# Patient Record
Sex: Male | Born: 1935
Health system: Southern US, Community
[De-identification: ages and names within clinical notes are randomized; demographics above are authoritative.]

## PROBLEM LIST (undated history)

## (undated) DIAGNOSIS — K219 Gastro-esophageal reflux disease without esophagitis: Secondary | ICD-10-CM

## (undated) DIAGNOSIS — I350 Nonrheumatic aortic (valve) stenosis: Secondary | ICD-10-CM

## (undated) DIAGNOSIS — D631 Anemia in chronic kidney disease: Secondary | ICD-10-CM

## (undated) DIAGNOSIS — C9140 Hairy cell leukemia not having achieved remission: Secondary | ICD-10-CM

## (undated) DIAGNOSIS — T82190A Other mechanical complication of cardiac electrode, initial encounter: Secondary | ICD-10-CM

## (undated) DIAGNOSIS — I4891 Unspecified atrial fibrillation: Secondary | ICD-10-CM

## (undated) DIAGNOSIS — Z5189 Encounter for other specified aftercare: Secondary | ICD-10-CM

## (undated) DIAGNOSIS — IMO0001 Reserved for inherently not codable concepts without codable children: Secondary | ICD-10-CM

## (undated) DIAGNOSIS — D509 Iron deficiency anemia, unspecified: Secondary | ICD-10-CM

## (undated) DIAGNOSIS — I255 Ischemic cardiomyopathy: Secondary | ICD-10-CM

## (undated) DIAGNOSIS — I10 Essential (primary) hypertension: Secondary | ICD-10-CM

## (undated) DIAGNOSIS — C189 Malignant neoplasm of colon, unspecified: Secondary | ICD-10-CM

## (undated) DIAGNOSIS — R0602 Shortness of breath: Secondary | ICD-10-CM

## (undated) DIAGNOSIS — N189 Chronic kidney disease, unspecified: Secondary | ICD-10-CM

## (undated) DIAGNOSIS — D696 Thrombocytopenia, unspecified: Secondary | ICD-10-CM

## (undated) DIAGNOSIS — D649 Anemia, unspecified: Secondary | ICD-10-CM

## (undated) DIAGNOSIS — C61 Malignant neoplasm of prostate: Secondary | ICD-10-CM

## (undated) DIAGNOSIS — I2510 Atherosclerotic heart disease of native coronary artery without angina pectoris: Secondary | ICD-10-CM

## (undated) HISTORY — DX: Malignant neoplasm of prostate: C61

## (undated) HISTORY — PX: OTHER SURGICAL HISTORY: SHX169

## (undated) HISTORY — PX: APPENDECTOMY: SHX54

## (undated) HISTORY — DX: Anemia in chronic kidney disease: D63.1

## (undated) HISTORY — DX: Iron deficiency anemia, unspecified: D50.9

## (undated) HISTORY — DX: Thrombocytopenia, unspecified: D69.6

## (undated) HISTORY — PX: AORTIC VALVE REPLACEMENT: SHX41

## (undated) HISTORY — PX: CORONARY ARTERY BYPASS GRAFT: SHX141

## (undated) HISTORY — DX: Malignant neoplasm of colon, unspecified: C18.9

## (undated) HISTORY — PX: INSERT / REPLACE / REMOVE PACEMAKER: SUR710

## (undated) HISTORY — DX: Nonrheumatic aortic (valve) stenosis: I35.0

## (undated) HISTORY — DX: Unspecified atrial fibrillation: I48.91

## (undated) HISTORY — DX: Essential (primary) hypertension: I10

## (undated) HISTORY — PX: CARDIAC SURGERY: SHX584

## (undated) HISTORY — DX: Other mechanical complication of cardiac electrode, initial encounter: T82.190A

## (undated) HISTORY — DX: Chronic kidney disease, unspecified: N18.9

## (undated) HISTORY — DX: Hairy cell leukemia not having achieved remission: C91.40

## (undated) HISTORY — DX: Atherosclerotic heart disease of native coronary artery without angina pectoris: I25.10

## (undated) HISTORY — DX: Gastro-esophageal reflux disease without esophagitis: K21.9

---

## 1998-08-15 ENCOUNTER — Ambulatory Visit: Admission: RE | Admit: 1998-08-15 | Discharge: 1998-08-15 | Payer: Self-pay | Admitting: Critical Care Medicine

## 1998-10-06 HISTORY — PX: HEMICOLECTOMY: SHX854

## 1999-10-07 ENCOUNTER — Inpatient Hospital Stay (HOSPITAL_COMMUNITY): Admission: EM | Admit: 1999-10-07 | Discharge: 1999-10-17 | Payer: Self-pay | Admitting: Emergency Medicine

## 1999-10-07 ENCOUNTER — Encounter: Payer: Self-pay | Admitting: Emergency Medicine

## 1999-10-07 ENCOUNTER — Encounter (INDEPENDENT_AMBULATORY_CARE_PROVIDER_SITE_OTHER): Payer: Self-pay | Admitting: *Deleted

## 1999-10-10 ENCOUNTER — Encounter: Payer: Self-pay | Admitting: *Deleted

## 2000-11-13 ENCOUNTER — Ambulatory Visit (HOSPITAL_COMMUNITY): Admission: RE | Admit: 2000-11-13 | Discharge: 2000-11-13 | Payer: Self-pay | Admitting: *Deleted

## 2000-11-13 ENCOUNTER — Encounter: Payer: Self-pay | Admitting: *Deleted

## 2000-11-30 ENCOUNTER — Encounter (INDEPENDENT_AMBULATORY_CARE_PROVIDER_SITE_OTHER): Payer: Self-pay | Admitting: Specialist

## 2000-11-30 ENCOUNTER — Ambulatory Visit (HOSPITAL_COMMUNITY): Admission: RE | Admit: 2000-11-30 | Discharge: 2000-11-30 | Payer: Self-pay | Admitting: *Deleted

## 2002-02-21 ENCOUNTER — Emergency Department (HOSPITAL_COMMUNITY): Admission: EM | Admit: 2002-02-21 | Discharge: 2002-02-21 | Payer: Self-pay | Admitting: Emergency Medicine

## 2002-03-22 ENCOUNTER — Emergency Department (HOSPITAL_COMMUNITY): Admission: EM | Admit: 2002-03-22 | Discharge: 2002-03-22 | Payer: Self-pay | Admitting: Emergency Medicine

## 2002-03-22 ENCOUNTER — Encounter: Payer: Self-pay | Admitting: Emergency Medicine

## 2003-02-02 ENCOUNTER — Ambulatory Visit (HOSPITAL_COMMUNITY): Admission: RE | Admit: 2003-02-02 | Discharge: 2003-02-02 | Payer: Self-pay | Admitting: *Deleted

## 2004-01-25 ENCOUNTER — Ambulatory Visit (HOSPITAL_COMMUNITY): Admission: RE | Admit: 2004-01-25 | Discharge: 2004-01-25 | Payer: Self-pay | Admitting: *Deleted

## 2004-02-13 ENCOUNTER — Ambulatory Visit (HOSPITAL_COMMUNITY): Admission: RE | Admit: 2004-02-13 | Discharge: 2004-02-13 | Payer: Self-pay | Admitting: *Deleted

## 2004-02-13 ENCOUNTER — Encounter (INDEPENDENT_AMBULATORY_CARE_PROVIDER_SITE_OTHER): Payer: Self-pay | Admitting: Specialist

## 2006-09-05 ENCOUNTER — Ambulatory Visit: Payer: Self-pay | Admitting: Hematology & Oncology

## 2006-09-30 LAB — CBC & DIFF AND RETIC
BASO%: 0.7 % (ref 0.0–2.0)
Basophils Absolute: 0 10*3/uL (ref 0.0–0.1)
EOS%: 1.5 % (ref 0.0–7.0)
HCT: 39.2 % (ref 38.7–49.9)
IRF: 0.31 (ref 0.070–0.380)
MCH: 33.4 pg (ref 28.0–33.4)
MCHC: 35.3 g/dL (ref 32.0–35.9)
MCV: 94.5 fL (ref 81.6–98.0)
MONO%: 9.7 % (ref 0.0–13.0)
NEUT%: 45.4 % (ref 40.0–75.0)
RDW: 14.4 % (ref 11.2–14.6)
lymph#: 1.3 10*3/uL (ref 0.9–3.3)

## 2006-10-01 LAB — PROTEIN ELECTROPHORESIS, SERUM
Alpha-1-Globulin: 4 % (ref 2.9–4.9)
Alpha-2-Globulin: 10 % (ref 7.1–11.8)
Beta Globulin: 6.3 % (ref 4.7–7.2)
Total Protein, Serum Electrophoresis: 8.1 g/dL (ref 6.0–8.3)

## 2006-10-01 LAB — VITAMIN B12: Vitamin B-12: 314 pg/mL (ref 211–911)

## 2006-11-02 ENCOUNTER — Encounter (INDEPENDENT_AMBULATORY_CARE_PROVIDER_SITE_OTHER): Payer: Self-pay | Admitting: Specialist

## 2006-11-02 ENCOUNTER — Inpatient Hospital Stay (HOSPITAL_COMMUNITY): Admission: RE | Admit: 2006-11-02 | Discharge: 2006-11-11 | Payer: Self-pay | Admitting: Surgery

## 2006-11-24 ENCOUNTER — Ambulatory Visit: Payer: Self-pay | Admitting: Surgery

## 2006-12-02 ENCOUNTER — Ambulatory Visit: Payer: Self-pay | Admitting: Hematology & Oncology

## 2008-05-19 ENCOUNTER — Ambulatory Visit (HOSPITAL_COMMUNITY): Admission: RE | Admit: 2008-05-19 | Discharge: 2008-05-19 | Payer: Self-pay | Admitting: *Deleted

## 2008-11-26 ENCOUNTER — Emergency Department (HOSPITAL_COMMUNITY): Admission: EM | Admit: 2008-11-26 | Discharge: 2008-11-26 | Payer: Self-pay | Admitting: Emergency Medicine

## 2009-08-23 ENCOUNTER — Ambulatory Visit: Payer: Self-pay | Admitting: Internal Medicine

## 2009-08-23 ENCOUNTER — Inpatient Hospital Stay (HOSPITAL_COMMUNITY): Admission: EM | Admit: 2009-08-23 | Discharge: 2009-08-28 | Payer: Self-pay | Admitting: Emergency Medicine

## 2009-08-24 ENCOUNTER — Encounter (INDEPENDENT_AMBULATORY_CARE_PROVIDER_SITE_OTHER): Payer: Self-pay | Admitting: Internal Medicine

## 2009-08-24 ENCOUNTER — Ambulatory Visit: Payer: Self-pay | Admitting: Vascular Surgery

## 2009-08-24 ENCOUNTER — Encounter: Payer: Self-pay | Admitting: Internal Medicine

## 2009-08-27 ENCOUNTER — Encounter (INDEPENDENT_AMBULATORY_CARE_PROVIDER_SITE_OTHER): Payer: Self-pay | Admitting: Internal Medicine

## 2009-08-28 ENCOUNTER — Encounter: Payer: Self-pay | Admitting: Internal Medicine

## 2009-09-13 ENCOUNTER — Encounter: Payer: Self-pay | Admitting: Internal Medicine

## 2009-09-13 ENCOUNTER — Ambulatory Visit: Payer: Self-pay

## 2009-12-04 ENCOUNTER — Ambulatory Visit: Payer: Self-pay | Admitting: Internal Medicine

## 2009-12-04 DIAGNOSIS — Z950 Presence of cardiac pacemaker: Secondary | ICD-10-CM

## 2009-12-04 DIAGNOSIS — I25810 Atherosclerosis of coronary artery bypass graft(s) without angina pectoris: Secondary | ICD-10-CM | POA: Insufficient documentation

## 2009-12-04 DIAGNOSIS — I495 Sick sinus syndrome: Secondary | ICD-10-CM

## 2009-12-04 DIAGNOSIS — I4891 Unspecified atrial fibrillation: Secondary | ICD-10-CM | POA: Insufficient documentation

## 2009-12-10 LAB — CONVERTED CEMR LAB
CO2: 28 meq/L (ref 19–32)
Calcium: 9.5 mg/dL (ref 8.4–10.5)
Chloride: 108 meq/L (ref 96–112)
Creatinine, Ser: 1.5 mg/dL (ref 0.4–1.5)
Sodium: 139 meq/L (ref 135–145)

## 2010-01-01 ENCOUNTER — Ambulatory Visit: Payer: Self-pay | Admitting: Internal Medicine

## 2010-02-01 ENCOUNTER — Ambulatory Visit: Payer: Self-pay | Admitting: Internal Medicine

## 2010-05-13 ENCOUNTER — Ambulatory Visit: Payer: Self-pay

## 2010-05-13 ENCOUNTER — Encounter: Payer: Self-pay | Admitting: Internal Medicine

## 2010-09-03 ENCOUNTER — Ambulatory Visit: Payer: Self-pay | Admitting: Internal Medicine

## 2010-09-09 LAB — CONVERTED CEMR LAB: Pro B Natriuretic peptide (BNP): 206.9 pg/mL — ABNORMAL HIGH (ref 0.0–100.0)

## 2010-09-25 ENCOUNTER — Ambulatory Visit: Payer: Self-pay | Admitting: Internal Medicine

## 2010-10-02 LAB — CONVERTED CEMR LAB: Pro B Natriuretic peptide (BNP): 316.7 pg/mL — ABNORMAL HIGH (ref 0.0–100.0)

## 2010-10-28 ENCOUNTER — Encounter: Payer: Self-pay | Admitting: Physician Assistant

## 2010-10-28 ENCOUNTER — Other Ambulatory Visit: Payer: Self-pay | Admitting: Physician Assistant

## 2010-10-28 ENCOUNTER — Ambulatory Visit
Admission: RE | Admit: 2010-10-28 | Discharge: 2010-10-28 | Payer: Self-pay | Source: Home / Self Care | Attending: Internal Medicine | Admitting: Internal Medicine

## 2010-10-28 DIAGNOSIS — Z954 Presence of other heart-valve replacement: Secondary | ICD-10-CM | POA: Insufficient documentation

## 2010-10-28 DIAGNOSIS — I10 Essential (primary) hypertension: Secondary | ICD-10-CM | POA: Insufficient documentation

## 2010-10-28 LAB — BASIC METABOLIC PANEL
BUN: 25 mg/dL — ABNORMAL HIGH (ref 6–23)
CO2: 26 mEq/L (ref 19–32)
Calcium: 9 mg/dL (ref 8.4–10.5)
Chloride: 105 mEq/L (ref 96–112)
Creatinine, Ser: 1.6 mg/dL — ABNORMAL HIGH (ref 0.4–1.5)

## 2010-10-29 ENCOUNTER — Telehealth: Payer: Self-pay | Admitting: Physician Assistant

## 2010-11-05 NOTE — Cardiovascular Report (Signed)
Summary: Office Visit   Office Visit   Imported By: Roderic Ovens 05/29/2010 15:52:44  _____________________________________________________________________  External Attachment:    Type:   Image     Comment:   External Document

## 2010-11-05 NOTE — Assessment & Plan Note (Signed)
Summary: device/saf   CC:  device check.  Pt took last Lasix pill yesterday morning.  Not sure if he is supposed to renew this Rx.  Marland Kitchen  History of Present Illness: [name] is seen following CRT P. implantation for symptomatic bradycardia in this state of ischemic heart disease prior bypass surgery status post bioprosthetic aortic valve and ejection fraction of 40%.  We saw him last time there was symptoms of congestive heart failure. His BNP was over 600. His diuretics were increased. We also noted evidence of atrial fibrillation.  Most recent echocardiography from November 2010 reported an inability to measure ejection fraction because of poor acoustic windows, thought to be low normal. It is his impression and his wife's impression it is not considerably better. There was a recent episode where they thought he had pneumonia. This turned out to correlate with a week long of atrial fibrillation with a rapid ventricular response.    Current Medications (verified): 1)  Aspirin 81 Mg Tbec (Aspirin) .... Take One Tablet By Mouth Daily 2)  Carvedilol 25 Mg Tabs (Carvedilol) .... Take One Tablet Two Times A Day 3)  Flomax 0.4 Mg Caps (Tamsulosin Hcl) .... Take 1 Capsule By Mouth Once A Day 4)  Lisinopril 10 Mg Tabs (Lisinopril) .... Take One Tablet By Mouth Daily 5)  Mirtazapine 15 Mg Tabs (Mirtazapine) .... Take 1 Tab By Mouth At Bedtime 6)  Omeprazole 20 Mg Cpdr (Omeprazole) .... Take 1 Capsule By Mouth Once A Day 7)  Simvastatin 40 Mg Tabs (Simvastatin) .... Take One Tablet By Mouth Daily At Bedtime 8)  Lasix 20 Mg Tabs (Furosemide) .... Three Times Per Week  Allergies (verified): No Known Drug Allergies  Past History:  Past Medical History: Last updated: 11/30/2009 Atrial fibrillation Coronary artery disease Aortic stenosis Hypertension Colon cancer Prostate cancer Gastroesophageal reflux disease  Peptic ulcer disease [employer]  Past Surgical History: Last updated:  11/30/2009 Coronary coronary artery bypass graft 3 years ago Aortic valve replacement 3 years ago Left eye cataract extraction Appendectomy Right hemicolectomy in 2000 Status post biventricular pacemaker     Family History: Last updated: 11/30/2009  Mother had heart disease.      Social History: Last updated: 11/30/2009 He is married.  No history of alcohol or drug abuse.   He used to smoke a pipe, but he quit smoking that more than 30 years   ago.   Vital Signs:  Patient profile:   77 year old male Height:      70 inches Weight:      212 pounds Pulse rate:   68 / minute BP sitting:   113 / 60  (left arm) Cuff size:   large  Vitals Entered By: Judithe Modest CMA (January 01, 2010 10:50 AM)  Physical Exam  General:  Well developed, well nourished, in no acute distress. Head:  normal HEENT Neck:  supple with flat neck veins Lungs:  clear  Heart:  Regular rate and rhythm without murmurs or gallops Abdomen:  soft nontender Extremities:  1-2+ edema Neurologic:  grossly normal   PPM Specifications Following MD:  Sherryl Manges, MD     Referring MD:  [facility] Vendor:  [employer] Jude     PPM Model Number:  [id number]     PPM Serial Number:  [id number] PPM DOI:  08/27/2009     PPM Implanting MD:  Sherryl Manges, MD  Lead 1    Location: RA     DOI: 08/27/2009  Model #: [id number]     Serial #: [id number]     Status: active Lead 2    Location: RV     DOI: 08/27/2009     Model #: [id number]     Serial #: [id number]     Status: active Lead 3    Location: LV     DOI: 08/27/2009     Model #: [id number]     Serial #: [id number]     Status: active  Magnet Response Rate:  BOL 100 ERI 85    PPM Follow Up Remote Check?  No Battery Voltage:  2.96 V     Battery Est. Longevity:  7.2 years     Pacer Dependent:  No       PPM Device Measurements Atrium  Amplitude: 2.6 mV, Impedance: 350 ohms, Threshold: 0.625 V at 0.5 msec Right Ventricle  Amplitude: 12 mV, Impedance: 630 ohms, Threshold: 0.75 V at 0.5  msec Left Ventricle  Impedance: 760 ohms, Threshold: 1.0 V at 0.5 msec Configuration: BIPOLAR  Episodes MS Episodes:  4     Percent Mode Switch:  33%     Coumadin:  No Atrial Pacing:  18%     Ventricular Pacing:  100%  Parameters Mode:  DDD     Lower Rate Limit:  60     Upper Rate Limit:  110 Paced AV Delay:  150     Sensed AV Delay:  100 Next Cardiology Appt Due:  09/05/2010 Tech Comments:  Quick opt done, PAV 150, SAV100.4 mode switch episodes total 33% with the longest 6 days, - coumadin. ROV 12/11 with Dr. Laurena Spies, LPN  January 01, 2010 11:15 AM   Impression & Recommendations:  Problem # 1:  ATRIAL FIBRILLATION (ICD-427.31) the patient has atrial fibrillation. This correlated with his episode of "pneumonia". There was a rapid ventricular response. He may well need augmented rate control depending on the frequency of these episodes. I would probably use digoxin.  In addition he is thromboembolic risk factors noted for age: 70, hypertension, and congestive heart failure. He should be on Coumadin. I reviewed with him the benefits and risks. We'll plan to start at. He will get this to Dr.Pharrs office. His updated medication list for this problem includes:    Aspirin 81 Mg Tbec (Aspirin) .Marland Kitchen... Take one tablet by mouth daily    Carvedilol 25 Mg Tabs (Carvedilol) .Marland Kitchen... Take one tablet two times a day  Problem # 2:  CONGESTIVE HEART FAILURE, SYSTOLIC, CHRONIC (ICD-428.0) he continues to have evidence of congestive heart failure. I am not sure what his systolic function is. It has been presumed to be normal based on last echo although the acoustic window is poor.  Will plan to treat him with diuretics for right now going to daily Lasix. We'll plan to release visit his echo in about 4 weeks and consider AV optimization. I did that apparently today. I measured his AV delay which is 340 ms and I reprogrammed his his sense to 80-225 ms and his paced 80-180 ms. His updated medication list  for this problem includes:    Aspirin 81 Mg Tbec (Aspirin) .Marland Kitchen... Take one tablet by mouth daily    Carvedilol 25 Mg Tabs (Carvedilol) .Marland Kitchen... Take one tablet two times a day    Lisinopril 10 Mg Tabs (Lisinopril) .Marland Kitchen... Take one tablet by mouth daily    Lasix 20 Mg Tabs (Furosemide) .Marland Kitchen... Take 1 tablet by mouth once a day  Problem # 3:  CRT-PACEMAKER ST J (ICD-V45.01) Device parameters and data were reviewed and reprogramming was made as above    Patient Instructions: 1)  Your physician has recommended you make the following change in your medication: change your Furosemide to 20mg  daily.  2)  Your physician recommends that you schedule a follow-up appointment in: 4weeks with Dr Graciela Husbands.  Prescriptions: LASIX 20 MG TABS (FUROSEMIDE) Take 1 tablet by mouth once a day  #30 x 11   Entered by:   Optometrist BSN   Authorized by:   Nathen May, MD, [facility]   Signed by:   Gypsy Balsam RN BSN on 01/01/2010   Method used:   Electronically to        [facility]* (retail)       [address] [place], [place]  [address]       Ph: [phone] or [phone]       Fax: [phone]   RxID:   [id number]

## 2010-11-05 NOTE — Cardiovascular Report (Signed)
Summary: Office Visit   Office Visit   Imported By: Roderic Ovens 12/14/2009 12:04:12  _____________________________________________________________________  External Attachment:    Type:   Image     Comment:   External Document

## 2010-11-05 NOTE — Assessment & Plan Note (Signed)
Summary: pc2/jml   History of Present Illness: Mr. Pospisil is seen following CRT P. implantation for symptomatic bradycardia in this state of ischemic heart disease prior bypass surgery status post bioprosthetic aortic valve and ejection fraction of 40%. He also had class III congestive heart failure and paroxysmal fibrillation.  he has ongoing complaints of dyspnea on exertion. His wife says that he is appreciably better he is not so sure. He has some peripheral edema. He has not had nocturnal dyspnea or orthopnea.  echocardiography from November 2010 reported an inability to measure ejection fraction because of poor acoustic windows, thought to be low normal    Current Medications (verified): 1)  Aspirin 81 Mg Tbec (Aspirin) .... Take One Tablet By Mouth Daily 2)  Carvedilol 25 Mg Tabs (Carvedilol) .... Take One Tablet Two Times A Day 3)  Flomax 0.4 Mg Caps (Tamsulosin Hcl) .... Take 1 Capsule By Mouth Once A Day 4)  Lisinopril 10 Mg Tabs (Lisinopril) .... Take One Tablet By Mouth Daily 5)  Mirtazapine 15 Mg Tabs (Mirtazapine) .... Take 1 Tab By Mouth At Bedtime 6)  Omeprazole 20 Mg Cpdr (Omeprazole) .... Take 1 Capsule By Mouth Once A Day 7)  Simvastatin 40 Mg Tabs (Simvastatin) .... Take One Tablet By Mouth Daily At Bedtime  Allergies (verified): No Known Drug Allergies  Past History:  Past Medical History: Last updated: 11/30/2009 Atrial fibrillation Coronary artery disease Aortic stenosis Hypertension Colon cancer Prostate cancer Gastroesophageal reflux disease  Peptic ulcer disease [employer]  Past Surgical History: Last updated: 11/30/2009 Coronary coronary artery bypass graft 3 years ago Aortic valve replacement 3 years ago Left eye cataract extraction Appendectomy Right hemicolectomy in 2000 Status post biventricular pacemaker     Family History: Last updated: 11/30/2009  Mother had heart disease.      Social History: Last updated: 11/30/2009 He is married.   No history of alcohol or drug abuse.   He used to smoke a pipe, but he quit smoking that more than 30 years   ago.   Vital Signs:  Patient profile:   77 year old male Height:      70 inches Weight:      217 pounds Pulse rate:   61 / minute BP sitting:   146 / 64  (left arm) Cuff size:   large  Vitals Entered By: Judithe Modest CMA (December 04, 2009 10:56 AM)  Physical Exam  General:  The patient was alert and oriented in no acute distress. HEENT Normal.  Neck veins were flat, carotids were brisk.  Lungs were clear.  Heart sounds were regular without murmurs or gallops.  Abdomen was soft with active bowel sounds. There is no clubbing cyanosis 2+ edema Skin Warm and dry wound well healed   PPM Specifications Following MD:  Sherryl Manges, MD     Referring MD:  [facility] Vendor:  [employer] Jude     PPM Model Number:  [id number]     PPM Serial Number:  [id number] PPM DOI:  08/27/2009     PPM Implanting MD:  Sherryl Manges, MD  Lead 1    Location: RA     DOI: 08/27/2009     Model #: [id number]     Serial #: [id number]     Status: active Lead 2    Location: RV     DOI: 08/27/2009     Model #: [id number]     Serial #: [id number]     Status: active Lead 3  Location: LV     DOI: 08/27/2009     Model #: [id number]     Serial #: [id number]     Status: active  Magnet Response Rate:  BOL 100 ERI 85    PPM Follow Up Remote Check?  No Battery Voltage:  2.98 V     Battery Est. Longevity:  8.4 YEARS     Pacer Dependent:  No       PPM Device Measurements Atrium  Amplitude: 2.4 mV, Impedance: 360 ohms, Threshold: 0.625 V at 0.5 msec Right Ventricle  Amplitude: 12 mV, Impedance: 630 ohms, Threshold: 0.75 V at 0.5 msec Left Ventricle  Impedance: 750 ohms, Threshold: 1.0 V at 0.5 msec Configuration: BIPOLAR  Episodes MS Episodes:  14     Percent Mode Switch:  1%     Coumadin:  No Ventricular High Rate:  0     Atrial Pacing:  28%     Ventricular Pacing:  >99%  Parameters Mode:  DDD     Lower Rate Limit:  60      Upper Rate Limit:  110 Paced AV Delay:  180     Sensed AV Delay:  180 Next Cardiology Appt Due:  09/05/2010 Tech Comments:  Normal device function.  Autocapture on in all 3 leads.  Pt reports feeling better since device was implanted as far as energy level and SOB.  Longest mode switch episode 10 hours 18 minutes.  Some mode switch episodes are atrial tach with an atrial cycle length of .  Others are more clearly afib with atrial cycle lenghts up to .  Pt has history of PAF, not on Coumadin.  Not clear why in notes.  In discharge summary, a mention of tarry stools was made, unsure if GI work up has been done.   V rates controlled during afib.  No changes made today.  ROV 12-11 Dr Graciela Husbands. Gypsy Balsam RN BSN  December 04, 2009 11:13 AM   Impression & Recommendations:  Problem # 1:  CONGESTIVE HEART FAILURE, SYSTOLIC, CHRONIC (ICD-428.0) the patient has ongoing symptoms of congestion as well as peripheral edema. We are not sure what his ejection fraction is. His fever is systolic or diastolic artery. Given his evidence of fluid overload we'll begin him on low dose diuretic started Lasix 20 mg 3 times a week.  We'll measure his BNP and metabolic profile today. We'll see him again in 4 weeks  Problem # 2:  CRT-PACEMAKER ST J (ICD-V45.01) Device parameters and data were reviewed; device was reprogrammed for longevity  Problem # 3:  ATRIAL FIBRILLATION (ICD-427.31) Ifrequent episodes of atrial fibrillation persistsWe will need to have discussions regarding oral anticoagulation therapy His updated medication list for this problem includes:    Aspirin 81 Mg Tbec (Aspirin) .Marland Kitchen... Take one tablet by mouth daily    Carvedilol 25 Mg Tabs (Carvedilol) .Marland Kitchen... Take one tablet two times a day  Problem # 4:  CAD, AUTOLOGOUS BYPASS GRAFT (ICD-414.02) no evidence of chest pain;  with our inability to assess ejection fraction I may want to repeat a Myoview scan but we'll discuss this next His updated  medication list for this problem includes:    Aspirin 81 Mg Tbec (Aspirin) .Marland Kitchen... Take one tablet by mouth daily    Carvedilol 25 Mg Tabs (Carvedilol) .Marland Kitchen... Take one tablet two times a day    Lisinopril 10 Mg Tabs (Lisinopril) .Marland Kitchen... Take one tablet by mouth daily  Other Orders: TLB-BMP (Basic Metabolic Panel-BMET) (80048-METABOL) TLB-BNP (  B-Natriuretic Peptide) (83880-BNPR)  Patient Instructions: 1)  Your physician recommends that you HAVE LABS TODAY: BMET AND BNP 2)  Your physician has recommended you make the following change in your medication: START LASIX 20MG  THREE TIMES PER WEEK 3)  Your physician recommends that you schedule a follow-up appointment in: 4 WEEKS Prescriptions: LASIX 20 MG TABS (FUROSEMIDE) THREE TIMES PER WEEK  #12 x 11   Entered by:   Duncan Dull, RN, BSN   Authorized by:   Nathen May, MD, [facility]   Signed by:   Duncan Dull, RN, BSN on 12/04/2009   Method used:   Electronically to        [facility]* (retail)       [address] [place], [place]  [address]       Ph: [phone] or [phone]       Fax: [phone]   RxID:   [id number]

## 2010-11-05 NOTE — Assessment & Plan Note (Signed)
Summary: device/saf   Visit Type:  Follow-up Primary Provider:  Virgina Evener, MD  CC:  device check.  History of Present Illness: [name] is seen following CRT P. implantation for symptomatic bradycardia in this state of ischemic heart disease prior bypass surgery status post bioprosthetic aortic valve and ejection fraction of 40%.  We saw him last time there was symptoms of congestive heart failure. His BNP was over 600. His diuretics were increased. We also noted evidence of atrial fibrillation. He has reverted to sinus rhythm. He is feeling better. I should note that his last visit we also significantly decreased his AV delay. Interestingly hissurface QRS d is 112 ms  He continues to complain of shortness of breath; this is with exertion. He denies resting shortness of breath nocturnal dyspnea orthopnea and he had no peripheral edema. He also denies chest pain.  Review of prior data demonstrates that his echo cardiogram last fall demonstrated normal left ventricular function    Current Medications (verified): 1)  Carvedilol 40 Mg Tabs (Carvedilol) .... Take One Tablet  Daily 2)  Flomax 0.4 Mg Caps (Tamsulosin Hcl) .... Take 1 Capsule By Mouth Once A Day 3)  Lisinopril 10 Mg Tabs (Lisinopril) .... Take One Tablet By Mouth Daily 4)  Mirtazapine 15 Mg Tabs (Mirtazapine) .... Take 1 Tab By Mouth At Bedtime 5)  Omeprazole 20 Mg Cpdr (Omeprazole) .... Take 1 Capsule By Mouth Once A Day 6)  Simvastatin 40 Mg Tabs (Simvastatin) .... Take One Tablet By Mouth Daily At Bedtime 7)  Lasix 20 Mg Tabs (Furosemide) .... Take 1 Tablet By Mouth Once A Day 8)  Warfarin Sodium 5 Mg Tabs (Warfarin Sodium) .... Uad  Allergies (verified): No Known Drug Allergies  Past History:  Past Medical History: Last updated: 11/30/2009 Atrial fibrillation Coronary artery disease Aortic stenosis Hypertension Colon cancer Prostate cancer Gastroesophageal reflux disease  Peptic ulcer disease [employer]  Vital Signs:  Patient profile:   77 year old male Height:      70 inches Weight:      211.50 pounds BMI:     30.46 Pulse rate:   63 / minute Pulse rhythm:   regular Resp:     18 per minute BP sitting:   118 / 68  (right arm) Cuff size:   large  Vitals Entered By: Vikki Ports (September 03, 2010 10:31 AM)   Physical Exam  General:  The patient was alert and oriented in no acute distress. HEENT Normal.  Neck veins were flat, carotids were brisk.  Lungs were clear.  Heart sounds were regular without murmurs or gallops.  Abdomen was soft with active bowel sounds. There is no clubbing cyanosis or edema. Skin Warm and dry    PPM Specifications Following MD:  Sherryl Manges, MD     Referring MD:  [facility] Vendor:  [employer]     PPM Model Number:  [id number]     PPM Serial Number:  [id number] PPM DOI:  08/27/2009     PPM Implanting MD:  Sherryl Manges, MD  Lead 1    Location: RA     DOI: 08/27/2009     Model #: [id number]     Serial #: [id number]     Status: active Lead 2    Location: RV     DOI: 08/27/2009     Model #: [id number]     Serial #: [id number]     Status: active Lead 3    Location: LV  DOI: 08/27/2009     Model #: [id number]     Serial #: [id number]     Status: active  Magnet Response Rate:  BOL 100 ERI 85    PPM Follow Up Remote Check?  No Battery Voltage:  2.96 V     Battery Est. Longevity:  7.5 years     Pacer Dependent:  No       PPM Device Measurements Atrium  Amplitude: 2.6 mV, Impedance: 330 ohms, Threshold: 0.5 V at 0.5 msec Right Ventricle  Amplitude: 123 mV, Impedance: 630 ohms, Threshold: 0.75 V at 0.5 msec Left Ventricle  Impedance: 800 ohms, Threshold: 1.5 V at 0.5 msec Configuration: BIPOLAR  Episodes MS Episodes:  34     Percent Mode Switch:  1%     Coumadin:  Yes Atrial Pacing:  31%     Ventricular Pacing:  100%  Parameters Mode:  DDD     Lower Rate Limit:  60     Upper Rate Limit:  110 Paced AV Delay:  275     Sensed AV Delay:  250 Next Remote Date:   12/05/2010     Next Cardiology Appt Due:  08/07/2011 Tech Comments:  AV delays reprogrammed as above to allow for conduction now that his QRS is normal.    Device function normal.  Merlin transmissions every 3 months.  ROV 1 year with Dr. Graciela Husbands. Altha Harm, LPN  September 03, 2010 10:42 AM   Impression & Recommendations:  Problem # 1:  ATRIAL FIBRILLATION (ICD-427.31)  He has had scant atrial fibrillation. His updated medication list for this problem includes:    Warfarin Sodium 5 Mg Tabs (Warfarin sodium) ..... Uad  Orders: TLB-BNP (B-Natriuretic Peptide) (83880-BNPR)  Problem # 2:  CRT-PACEMAKER ST J (ICD-V45.01) we will reprogram his device today to increase the likelihood of intrinsic conduction. We'll see if this has any impact on his symptoms. Device parameters and data were reviewed and Changes were made as noted  Problem # 3:  CAD, AUTOLOGOUS BYPASS GRAFT (ICD-414.02) without chest pain His updated medication list for this problem includes:    Lisinopril 10 Mg Tabs (Lisinopril) .Marland Kitchen... Take one/half  tablet by mouth daily    Warfarin Sodium 5 Mg Tabs (Warfarin sodium) ..... Uad  Problem # 4:  DIASTOLIC HEART FAILURE, CHRONIC (ICD-428.32)  [name] ongoing symptoms of congestion. His BNP last brain was 600. I will check it again today. I wonder whether there may not be a contribution of ischemia. He Myoview may be helpful here also. His updated medication list for this problem includes:    Lisinopril 10 Mg Tabs (Lisinopril) .Marland Kitchen... Take one/half  tablet by mouth daily    Lasix 20 Mg Tabs (Furosemide) .Marland Kitchen... Take 1 tablet by mouth once a day    Warfarin Sodium 5 Mg Tabs (Warfarin sodium) ..... Uad  Orders: TLB-BNP (B-Natriuretic Peptide) (83880-BNPR)  Patient Instructions: 1)  Your physician recommends that you have lab work today: BNP 2)  Your physician has recommended you make the following change in your medication: Decrease Lisinopril to 5 mg.  3)  Your physician wants you  to follow-up in:  6 months You will receive a reminder letter in the mail two months in advance. If you don't receive a letter, please call our office to schedule the follow-up appointment.

## 2010-11-05 NOTE — Assessment & Plan Note (Signed)
Summary: 4WK F/U per check out/lg/ OK PER AMBER   Primary Provider:  Virgina Evener, MD   History of Present Illness: [name] is seen following CRT P. implantation for symptomatic bradycardia in this state of ischemic heart disease prior bypass surgery status post bioprosthetic aortic valve and ejection fraction of 40%.  We saw him last time there was symptoms of congestive heart failure. His BNP was over 600. His diuretics were increased. We also noted evidence of atrial fibrillation. He has reverted to sinus rhythm. He is feeling better. I should note that his last visit we also significantly decreased his AV delay. Interestingly hissurface QRS d is 112 ms    Current Medications (verified): 1)  Aspirin 81 Mg Tbec (Aspirin) .... Take One Tablet By Mouth Daily 2)  Carvedilol 25 Mg Tabs (Carvedilol) .... Take One Tablet Two Times A Day 3)  Flomax 0.4 Mg Caps (Tamsulosin Hcl) .... Take 1 Capsule By Mouth Once A Day 4)  Lisinopril 10 Mg Tabs (Lisinopril) .... Take One Tablet By Mouth Daily 5)  Mirtazapine 15 Mg Tabs (Mirtazapine) .... Take 1 Tab By Mouth At Bedtime 6)  Omeprazole 20 Mg Cpdr (Omeprazole) .... Take 1 Capsule By Mouth Once A Day 7)  Simvastatin 40 Mg Tabs (Simvastatin) .... Take One Tablet By Mouth Daily At Bedtime 8)  Lasix 20 Mg Tabs (Furosemide) .... Take 1 Tablet By Mouth Once A Day 9)  Warfarin Sodium 5 Mg Tabs (Warfarin Sodium) .... Uad  Allergies (verified): No Known Drug Allergies  Past History:  Past Medical History: Last updated: 11/30/2009 Atrial fibrillation Coronary artery disease Aortic stenosis Hypertension Colon cancer Prostate cancer Gastroesophageal reflux disease  Peptic ulcer disease [employer]  Past Surgical History: Last updated: 11/30/2009 Coronary coronary artery bypass graft 3 years ago Aortic valve replacement 3 years ago Left eye cataract extraction Appendectomy Right hemicolectomy in 2000 Status post biventricular pacemaker      Family History: Last updated: 11/30/2009  Mother had heart disease.      Social History: Last updated: 11/30/2009 He is married.  No history of alcohol or drug abuse.   He used to smoke a pipe, but he quit smoking that more than 30 years   ago.   Vital Signs:  Patient profile:   77 year old male Height:      70 inches Weight:      209 pounds BMI:     30.10 Pulse rate:   66 / minute Pulse rhythm:   regular BP sitting:   146 / 74  (left arm) Cuff size:   large  Vitals Entered By: Judithe Modest CMA (February 01, 2010 3:37 PM)  Physical Exam  General:  Well developed, well nourished, in no acute distress. Head:  normal HEENT Neck:  supple with flat neck veins Lungs:  clear  Heart:  Regular rate and rhythm without murmurs or gallops Abdomen:  soft nontender Extremities:  trace edema Neurologic:  grossly normal alert and oriented Skin:  Intact without lesions or rashes.   EKG  Procedure date:  02/01/2010  Findings:      sinus rhythm at 66 Intervals 0.24/0.11/0.40 Axis CCLXII P. synchronous pacing  PPM Specifications Following MD:  Sherryl Manges, MD     Referring MD:  [facility] Vendor:  [employer]     PPM Model Number:  [id number]     PPM Serial Number:  [id number] PPM DOI:  08/27/2009     PPM Implanting MD:  Sherryl Manges, MD  Lead  1    Location: RA     DOI: 08/27/2009     Model #: [id number]     Serial #: [id number]     Status: active Lead 2    Location: RV     DOI: 08/27/2009     Model #: [id number]     Serial #: [id number]     Status: active Lead 3    Location: LV     DOI: 08/27/2009     Model #: [id number]     Serial #: [id number]     Status: active  Magnet Response Rate:  BOL 100 ERI 85    PPM Follow Up Pacer Dependent:  No     Configuration: BIPOLAR  Episodes Coumadin:  No  Parameters Mode:  DDD     Lower Rate Limit:  60     Upper Rate Limit:  110 Paced AV Delay:  150     Sensed AV Delay:  100  Impression & Recommendations:  Problem # 1:  ATRIAL FIBRILLATION  (ICD-427.31) interrogation of his device suggests that he is maintaining sinus rhythm. At his next visit we will want to stop his aspirin His updated medication list for this problem includes:    Aspirin 81 Mg Tbec (Aspirin) .Marland Kitchen... Take one tablet by mouth daily    Carvedilol 25 Mg Tabs (Carvedilol) .Marland Kitchen... Take one tablet two times a day    Warfarin Sodium 5 Mg Tabs (Warfarin sodium) ..... Uad  Problem # 2:  CRT-PACEMAKER ST J (ICD-V45.01) Device parameters and data were reviewed and no changes were made  without evidence of recurrent atrial fibrillation last for 5 week  Problem # 3:  SINOATRIAL NODE DYSFUNCTION (ICD-427.81) as above His updated medication list for this problem includes:    Aspirin 81 Mg Tbec (Aspirin) .Marland Kitchen... Take one tablet by mouth daily    Carvedilol 25 Mg Tabs (Carvedilol) .Marland Kitchen... Take one tablet two times a day    Lisinopril 10 Mg Tabs (Lisinopril) .Marland Kitchen... Take one tablet by mouth daily    Warfarin Sodium 5 Mg Tabs (Warfarin sodium) ..... Uad  Problem # 4:  CONGESTIVE HEART FAILURE, SYSTOLIC, CHRONIC (ICD-428.0)   stable actually somewhat improved over the last few weeks His updated medication list for this problem includes:    Aspirin 81 Mg Tbec (Aspirin) .Marland Kitchen... Take one tablet by mouth daily    Carvedilol 25 Mg Tabs (Carvedilol) .Marland Kitchen... Take one tablet two times a day    Lisinopril 10 Mg Tabs (Lisinopril) .Marland Kitchen... Take one tablet by mouth daily    Lasix 20 Mg Tabs (Furosemide) .Marland Kitchen... Take 1 tablet by mouth once a day    Warfarin Sodium 5 Mg Tabs (Warfarin sodium) ..... Uad  Patient Instructions: 1)  Your physician recommends that you schedule a follow-up appointment in: 3 months with device clinic.

## 2010-11-05 NOTE — Procedures (Signed)
Summary: device check   Current Medications (verified): 1)  Carvedilol 40 Mg Tabs (Carvedilol) .... Take One Tablet  Daily 2)  Flomax 0.4 Mg Caps (Tamsulosin Hcl) .... Take 1 Capsule By Mouth Once A Day 3)  Lisinopril 10 Mg Tabs (Lisinopril) .... Take One Tablet By Mouth Daily 4)  Mirtazapine 15 Mg Tabs (Mirtazapine) .... Take 1 Tab By Mouth At Bedtime 5)  Omeprazole 20 Mg Cpdr (Omeprazole) .... Take 1 Capsule By Mouth Once A Day 6)  Simvastatin 40 Mg Tabs (Simvastatin) .... Take One Tablet By Mouth Daily At Bedtime 7)  Lasix 20 Mg Tabs (Furosemide) .... Take 1 Tablet By Mouth Once A Day 8)  Warfarin Sodium 5 Mg Tabs (Warfarin Sodium) .... Uad  Allergies (verified): No Known Drug Allergies  PPM Specifications Following MD:  Sherryl Manges, MD     Referring MD:  [facility] Vendor:  [employer]     PPM Model Number:  [phone]     PPM Serial Number:  [id number] PPM DOI:  08/27/2009     PPM Implanting MD:  Sherryl Manges, MD  Lead 1    Location: RA     DOI: 08/27/2009     Model #: [id number]     Serial #: [id number]     Status: active Lead 2    Location: RV     DOI: 08/27/2009     Model #: [id number]     Serial #: [id number]     Status: active Lead 3    Location: LV     DOI: 08/27/2009     Model #: 1258T     Serial #: [id number]     Status: active  Magnet Response Rate:  BOL 100 ERI 85    PPM Follow Up Remote Check?  No Battery Voltage:  2.98 V     Battery Est. Longevity:  7.9 years     Pacer Dependent:  No       PPM Device Measurements Atrium  Amplitude: 2.1 mV, Impedance: 330 ohms, Threshold: 0.625 V at 0.5 msec Right Ventricle  Amplitude: 11.8 mV, Impedance: 630 ohms, Threshold: 0.75 V at 0.5 msec Left Ventricle  Impedance: 780 ohms, Threshold: 1.375 V at 0.5 msec Configuration: BIPOLAR  Episodes MS Episodes:  16     Percent Mode Switch:  1.7%     Coumadin:  Yes Atrial Pacing:  24%     Ventricular Pacing:  99%  Parameters Mode:  DDD     Lower Rate Limit:  60     Upper Rate Limit:  110 Paced AV  Delay:  150     Sensed AV Delay:  100 Next Cardiology Appt Due:  08/06/2010 Tech Comments:  No parameter changes.  Device function normal. The longest A-fib episode 20:15 hours, + coumadin.  ROV 3 months with Dr. Graciela Husbands. Altha Harm, LPN  May 13, 2010 10:21 AM

## 2010-11-07 ENCOUNTER — Ambulatory Visit (HOSPITAL_COMMUNITY): Payer: Medicare Other | Attending: Cardiology

## 2010-11-07 ENCOUNTER — Encounter: Payer: Self-pay | Admitting: Internal Medicine

## 2010-11-07 ENCOUNTER — Other Ambulatory Visit: Payer: Self-pay | Admitting: Internal Medicine

## 2010-11-07 ENCOUNTER — Other Ambulatory Visit (INDEPENDENT_AMBULATORY_CARE_PROVIDER_SITE_OTHER): Payer: MEDICARE

## 2010-11-07 DIAGNOSIS — E785 Hyperlipidemia, unspecified: Secondary | ICD-10-CM | POA: Insufficient documentation

## 2010-11-07 DIAGNOSIS — I080 Rheumatic disorders of both mitral and aortic valves: Secondary | ICD-10-CM | POA: Insufficient documentation

## 2010-11-07 DIAGNOSIS — R0602 Shortness of breath: Secondary | ICD-10-CM

## 2010-11-07 DIAGNOSIS — I4891 Unspecified atrial fibrillation: Secondary | ICD-10-CM

## 2010-11-07 DIAGNOSIS — I2510 Atherosclerotic heart disease of native coronary artery without angina pectoris: Secondary | ICD-10-CM

## 2010-11-07 DIAGNOSIS — I10 Essential (primary) hypertension: Secondary | ICD-10-CM | POA: Insufficient documentation

## 2010-11-07 DIAGNOSIS — I5023 Acute on chronic systolic (congestive) heart failure: Secondary | ICD-10-CM

## 2010-11-07 DIAGNOSIS — I079 Rheumatic tricuspid valve disease, unspecified: Secondary | ICD-10-CM | POA: Insufficient documentation

## 2010-11-07 DIAGNOSIS — I5032 Chronic diastolic (congestive) heart failure: Secondary | ICD-10-CM | POA: Insufficient documentation

## 2010-11-07 LAB — BASIC METABOLIC PANEL
BUN: 30 mg/dL — ABNORMAL HIGH (ref 6–23)
Chloride: 104 mEq/L (ref 96–112)
GFR: 36.69 mL/min — ABNORMAL LOW (ref >60.00)
Sodium: 135 mEq/L (ref 135–145)

## 2010-11-07 NOTE — Progress Notes (Signed)
Summary: device transmission  ---- Converted from flag ---- ---- 10/29/2010 4:53 PM, Altha Harm, LPN wrote: I left him a message on how to send his transmission that's scheduled for 12/05/10.  ---- 10/29/2010 9:18 AM, Sherri Rad, RN, BSN wrote: Richarda Blade & Cristin,  Ferlin Fairhurst saw this pt yesterday in clinic. He said he has some type of "box" at home for his device, but he doesn't know what it is for or how to use it. I told him I would have one of you give him a call to discuss this. Thanks, Geroge Baseman. ------------------------------

## 2010-11-07 NOTE — Assessment & Plan Note (Addendum)
Summary: rov-bnp trending up   Primary Provider:  Virgina Evener, MD  CC:  follow up.  History of Present Illness: Primary Electrophysiologist:  Dr. Sherryl Manges  [name] is an 77 year old male with a history of CAD, status post CABG in January 2008 as well as aortic valve replacement secondary to severe AS, ischemic cardiomyopathy with a prior ejection fraction of 40%, status post biventricular pacemaker and paroxysmal atrial fibrillation on chronic Coumadin therapy.  His last echocardiogram performed in November 2010 demonstrated moderate LVH, mild aortic stenosis and mild mitral regurgitation.  His ejection fraction could not be estimated but appeared to be low normal.  He had seen Dr. Graciela Husbands several months ago with an elevated BNP.  His diuretics were adjusted.  Followup lab work in recent weeks has demonstrated a BNP trending upward.  This was last checked December 21 and was 316.7.  He was set up for followup today.  His weight on November 29 was 211.5 pounds  He describes dyspnea with exertion.  His symptoms of DOE have been stable over the last 6-12 months without significant change.  He describes NYHA class II to 2B symptoms.  He denies orthopnea or PND.  He denies pedal edema.  He denies syncope.  He denies chest discomfort.  He denies exertional arm or jaw discomfort, nausea or diaphoresis.  His weight is up about 2-3 pounds at home.  He denies any increased abdominal girth.  Current Medications (verified): 1)  Carvedilol 40 Mg Tabs (Carvedilol) .... Take One Tablet  Daily 2)  Flomax 0.4 Mg Caps (Tamsulosin Hcl) .... Take 1 Capsule By Mouth Once A Day 3)  Lisinopril 10 Mg Tabs (Lisinopril) .... Take One/half  Tablet By Mouth Daily 4)  Mirtazapine 15 Mg Tabs (Mirtazapine) .... Take 1 Tab By Mouth At Bedtime 5)  Omeprazole 20 Mg Cpdr (Omeprazole) .... Take 1 Capsule By Mouth Once A Day 6)  Simvastatin 40 Mg Tabs (Simvastatin) .... Take One Tablet By Mouth Daily At Bedtime 7)  Lasix 20  Mg Tabs (Furosemide) .... Take 1 Tablet By Mouth Once A Day 8)  Warfarin Sodium 5 Mg Tabs (Warfarin Sodium) .... Uad  Allergies: No Known Drug Allergies  Past History:  Past Medical History: Last updated: 11/30/2009 Atrial fibrillation Coronary artery disease Aortic stenosis Hypertension Colon cancer Prostate cancer Gastroesophageal reflux disease  Peptic ulcer disease [employer]  Review of Systems       He has a cough with scant clear sputum in the mornings.  Otherwise, As per  the HPI.  All other systems reviewed and negative.   Vital Signs:  Patient profile:   77 year old male Height:      70 inches Weight:      213 pounds BMI:     30.67 Pulse rate:   61 / minute Resp:     18 per minute BP sitting:   156 / 71  (left arm)  Vitals Entered By: Kem Parkinson (October 28, 2010 11:24 AM)  Physical Exam  General:  Well nourished, well developed, in no acute distress HEENT: normal Neck: + JVD Cardiac:  normal S1, S2; RRR; no gallops; 2/6 diast murmur Lungs:  clear to auscultation bilaterally, no wheezing, rhonchi or rales Abd: soft, nontender, no hepatomegaly Ext: tight trace bilat edema Skin: warm and dry Neuro:  CNs 2-12 intact, no focal abnormalities noted    EKG  Procedure date:  10/28/2010  Findings:      Underlying sinus rhythm with first degree AV  block Ventricular pacing with a heart rate of 61  PPM Specifications Following MD:  Sherryl Manges, MD     Referring MD:  [facility] Vendor:  [employer] Jude     PPM Model Number:  [id number]     PPM Serial Number:  [id number] PPM DOI:  08/27/2009     PPM Implanting MD:  Sherryl Manges, MD  Lead 1    Location: RA     DOI: 08/27/2009     Model #: [id number]     Serial #: [id number]     Status: active Lead 2    Location: RV     DOI: 08/27/2009     Model #: [id number]     Serial #: [id number]     Status: active Lead 3    Location: LV     DOI: 08/27/2009     Model #: 1258T     Serial #: [id number]     Status: active  Magnet Response  Rate:  BOL 100 ERI 85    PPM Follow Up Pacer Dependent:  No     Configuration: BIPOLAR  Episodes Coumadin:  Yes  Parameters Mode:  DDD     Lower Rate Limit:  60     Upper Rate Limit:  110 Paced AV Delay:  275     Sensed AV Delay:  250  Impression & Recommendations:  Problem # 1:  ACUTE CHRONIC COMB SYSTOLIC&DIASTOLIC HEART FAIL (ICD-428.43) He has had a BNP of about 300 recently.  His symptoms of DOE sound consistent with CHF.  He describes NYHA class II to IIb symptoms.  On exam, he has mildly elevated JVP, no rales and very little extremity edema.  I think he would do well with a gentle increase in his diuretics.  He will take Furosemide 40 mg alt with 20 mg every other day.   I will check a BNP and a basic metabolic panel today.  He will have a followup basic metabolic panel in about 10 days.  I will also set him up for a followup echocardiogram.  He had probable low normal ejection fraction and moderate LVH on echocardiogram in November 2010.  His prior ejection fraction was 40%.  I suspect he has chronic combined systolic and diastolic heart failure.  Problem # 2:  CAD, AUTOLOGOUS BYPASS GRAFT (ICD-414.02) He is not having any angina.  He is not on ASA due to coumadin.  Problem # 3:  ATRIAL FIBRILLATION (ICD-427.31) He is maintaining NSR today.  Orders: EKG w/ Interpretation (93000) TLB-BMP (Basic Metabolic Panel-BMET) (80048-METABOL) TLB-BNP (B-Natriuretic Peptide) (83880-BNPR) Echocardiogram (Echo)  Problem # 4:  CRT-PACEMAKER ST J (ICD-V45.01) I will have our device nurses call him to explain his remote device.  He had a lot of questions about it today.  Problem # 5:  HYPERTENSION (ICD-401.9) If his BP does not improve with changing his lasix, we could increase his ACE.  Problem # 6:  AORTIC VALVE REPLACEMENT, HX OF (ICD-V43.3) Get follow up echo.  Patient Instructions: 1)  Labwork today: bmet/bnp (428.43;414.01;427.31). 2)  Repeat labwork in about 10 days: bmet/bnp  (428.43;414.01;427.31). 3)  Your physician has requested that you have an echocardiogram.  Echocardiography is a painless test that uses sound waves to create images of your heart. It provides your doctor with information about the size and shape of your heart and how well your heart's chambers and valves are working.  This procedure takes approximately one hour. There are no restrictions for this procedure. 4)  Your physician recommends that you schedule a follow-up appointment in: 2-3 weeks with Dr. Graciela Husbands, or with Lilian Coma on a day Dr. Graciela Husbands is in the office. 5)  We will have one of our device nurses call you about the box you have at home. 6)  Increase lasix to 20mg  once every other day alternating with 40mg  once every other day.

## 2010-11-13 ENCOUNTER — Ambulatory Visit (INDEPENDENT_AMBULATORY_CARE_PROVIDER_SITE_OTHER): Payer: MEDICARE | Admitting: Internal Medicine

## 2010-11-13 ENCOUNTER — Encounter: Payer: Self-pay | Admitting: Internal Medicine

## 2010-11-13 ENCOUNTER — Ambulatory Visit: Payer: Self-pay | Admitting: Internal Medicine

## 2010-11-13 DIAGNOSIS — R42 Dizziness and giddiness: Secondary | ICD-10-CM | POA: Insufficient documentation

## 2010-11-13 DIAGNOSIS — R0602 Shortness of breath: Secondary | ICD-10-CM

## 2010-11-13 DIAGNOSIS — I5032 Chronic diastolic (congestive) heart failure: Secondary | ICD-10-CM

## 2010-11-13 DIAGNOSIS — I495 Sick sinus syndrome: Secondary | ICD-10-CM

## 2010-11-13 NOTE — Assessment & Plan Note (Signed)
   Allergies: No Known Drug Allergies   Other Orders: TLB-BMP (Basic Metabolic Panel-BMET) (80048-METABOL) TLB-BNP (B-Natriuretic Peptide) (83880-BNPR)

## 2010-11-21 NOTE — Assessment & Plan Note (Signed)
Summary: 2-3 WEEK F/U ECHO DONE 11-07-10/SL   Primary Provider:  Virgina Evener, MD  CC:  follow up to echo/.  History of Present Illness:  [name] is an 77 year old male with a history of CAD, status post CABG in January 2008 as well as aortic valve replacement secondary to severe AS, ischemic cardiomyopathy with a prior ejection fraction of 40%, status post biventricular pacemaker and paroxysmal atrial fibrillation on chronic Coumadin therapy.  His last echocardiogram just performed  demonstrated moderate LVH, mild aortic stenosis and mild mitral regurgitation. and and EF of 55%    He describes dyspnea with exertion.  His symptoms of DOE have been stable over the last 6-12 months without significant change. He also had problems with lightheadedness which has been aggravated after he takes his carvedilol. He notes his blood pressure goes from the 150 range to the 120 range associated with his symptoms.   We have been following his BNP and his creatinine. In November a former was about 209 and is now running in the low 300s and is rather stable. His creatinine has been up a little bit most recently 1.9 from   the 1.5  range.  Because of the recent increase we decreased his diuretics to one a day. a followup metabolic profile is pending in 2 weeks   As noted his LV function was essentially normal; diastolic dysfunction is present  Current Medications (verified): 1)  Coreg Cr 40 Mg Xr24h-Cap (Carvedilol Phosphate) .... Take One Tablet Once Daily 2)  Flomax 0.4 Mg Caps (Tamsulosin Hcl) .... Take 1 Capsule By Mouth Once A Day 3)  Lisinopril 10 Mg Tabs (Lisinopril) .... Take One/half  Tablet By Mouth Daily 4)  Mirtazapine 15 Mg Tabs (Mirtazapine) .... Take 1 Tab By Mouth At Bedtime 5)  Omeprazole 20 Mg Cpdr (Omeprazole) .... Take 1 Capsule By Mouth Once A Day 6)  Simvastatin 40 Mg Tabs (Simvastatin) .... Take One Tablet By Mouth Daily At Bedtime 7)  Lasix 20 Mg Tabs (Furosemide) .... Take One  Tablet Once Daily 8)  Warfarin Sodium 5 Mg Tabs (Warfarin Sodium) .... Uad  Allergies (verified): No Known Drug Allergies  Past History:  Past Medical History: Last updated: 11/30/2009 Atrial fibrillation Coronary artery disease Aortic stenosis Hypertension Colon cancer Prostate cancer Gastroesophageal reflux disease  Peptic ulcer disease [employer]  Past Surgical History: Last updated: 11/30/2009 Coronary coronary artery bypass graft 3 years ago Aortic valve replacement 3 years ago Left eye cataract extraction Appendectomy Right hemicolectomy in 2000 Status post biventricular pacemaker     Family History: Last updated: 11/30/2009  Mother had heart disease.      Social History: Last updated: 11/30/2009 He is married.  No history of alcohol or drug abuse.   He used to smoke a pipe, but he quit smoking that more than 30 years   ago.   Vital Signs:  Patient profile:   77 year old male Height:      70 inches Weight:      212 pounds BMI:     30.53 Pulse rate:   80 / minute Pulse rhythm:   regular BP sitting:   119 / 53  (left arm) Cuff size:   regular  Vitals Entered By: Judithe Modest CMA (November 13, 2010 10:23 AM)  Physical Exam  General:  Well nourished, well developed, in no acute distress HEENT: normal Neck: + JVD Cardiac:  normal S1, S2; RRR; no gallops; 2/6 diast murmur Lungs:  clear to auscultation  bilaterally, no wheezing, rhonchi or rales Abd: soft, nontender, no hepatomegaly Ext: tight trace bilat edema Skin: warm and dry Neuro:  CNs 2-12 intact, no focal abnormalities noted    PPM Specifications Following MD:  Sherryl Manges, MD     Referring MD:  [facility] Vendor:  [employer]     PPM Model Number:  [id number]     PPM Serial Number:  [id number] DOI:  08/27/2009     PPM Implanting MD:  Sherryl Manges, MD  Lead 1    Location: RA     DOI: 08/27/2009     Model #: [id number]     Serial #: [id number]     Status: active Lead 2    Location: RV     DOI:  08/27/2009     Model #: [id number]     Serial #: [id number]     Status: active Lead 3    Location: LV     DOI: 08/27/2009     Model #: 1258T     Serial #: [id number]     Status: active  Magnet Response Rate:  BOL 100 ERI 85    PPM Follow Up Battery Voltage:  2.96 V     Battery Est. Longevity:  6.4 yrs     Pacer Dependent:  No       PPM Device Measurements Atrium  Amplitude: 2.4 mV, Impedance: 300 ohms, Threshold: 0.625 V at 0.5 msec Right Ventricle  Amplitude: 12.0 mV, Impedance: 660 ohms, Threshold: 0.75 V at 0.5 msec Left Ventricle  Impedance: 750 ohms, Threshold: 1.375 V at 0.5 msec Configuration: BIPOLAR  Episodes MS Episodes:  9     Percent Mode Switch:  2.2%     Coumadin:  Yes Atrial Pacing:  33%     Ventricular Pacing:  96%  Parameters Mode:  DDD     Lower Rate Limit:  60     Upper Rate Limit:  110 Paced AV Delay:  275     Sensed AV Delay:  250 Next Remote Date:  02/13/2011     Next Cardiology Appt Due:  11/07/2011 Tech Comments:  9 AMS EPISODES--LONGEST WAS 15 HRS. + WARFARIN. NORMAL DEVICE FUNCTION. NO CHANGES MADE. MERLIN 02-13-11 AND ROV IN 12 MTHS W/SK. Vella Kohler  November 13, 2010 10:40 AM  Impression & Recommendations:  Problem # 1:  ATRIAL FIBRILLATION (ICD-427.31) His paroxysmal atrial fibrillation.these episodes did not appear to correlate strongly with his symptoms as they've been relatively brief period His updated medication list for this problem includes:    Coreg Cr 40 Mg Xr24h-cap (Carvedilol phosphate) .Marland Kitchen... Take one tablet once daily    Warfarin Sodium 5 Mg Tabs (Warfarin sodium) ..... Uad  Problem # 2:  DIASTOLIC HEART FAILURE, CHRONIC (ICD-428.32) wdiscussed again the physiology. Because of the tendency to become prerenal, we will back off on his diuretics. I think we're going to have to accept some shortness of breath His updated medication list for this problem includes:    Coreg Cr 20 Mg Xr24h-cap (Carvedilol phosphate) .Marland Kitchen... Take one by mouth  daily    Lisinopril 10 Mg Tabs (Lisinopril) .Marland Kitchen... Take one/half  tablet by mouth daily    Lasix 20 Mg Tabs (Furosemide) .Marland Kitchen... Take one tablet once daily    Warfarin Sodium 5 Mg Tabs (Warfarin sodium) ..... Uad  Problem # 3:  CORONARY ATHEROSCLEROSIS NATIVE CORONARY ARTERY (ICD-414.01) stable on current meds His updated medication list for this problem includes:    Coreg Cr  20 Mg Xr24h-cap (Carvedilol phosphate) .Marland Kitchen... Take one by mouth daily    Lisinopril 10 Mg Tabs (Lisinopril) .Marland Kitchen... Take one/half  tablet by mouth daily    Warfarin Sodium 5 Mg Tabs (Warfarin sodium) ..... Uad  Problem # 4:  CRT-PACEMAKER ST J (ICD-V45.01) Device parameters and data were reviewed and no changes were made  Problem # 5:  ORTHOSTATIC DIZZINESS (ICD-780.4) Because of the dizziness will back off on his carvedilol to 20 mg a day. The fact that his left ventricular function is near normal axis a little bit easier to do. We're going to accept a systolic blood pressure I think in the 150 range  Patient Instructions: 1)  Your physician recommends that you schedule a follow-up appointment in: 6 months 2)  Your physician recommends that you return for lab work on November 22, 2010 3)  Your physician has recommended you make the following change in your medication: Decrease Coreg CR to 20 mg by mouth daily.

## 2010-11-22 ENCOUNTER — Encounter: Payer: Self-pay | Admitting: Internal Medicine

## 2010-11-22 ENCOUNTER — Other Ambulatory Visit (INDEPENDENT_AMBULATORY_CARE_PROVIDER_SITE_OTHER): Payer: Medicare Other

## 2010-11-22 ENCOUNTER — Other Ambulatory Visit: Payer: Self-pay | Admitting: Internal Medicine

## 2010-11-22 DIAGNOSIS — R0602 Shortness of breath: Secondary | ICD-10-CM

## 2010-11-22 DIAGNOSIS — E876 Hypokalemia: Secondary | ICD-10-CM

## 2010-11-22 LAB — BASIC METABOLIC PANEL
CO2: 27 mEq/L (ref 19–32)
Creatinine, Ser: 1.7 mg/dL — ABNORMAL HIGH (ref 0.4–1.5)
GFR: 39.62 mL/min — ABNORMAL LOW (ref >60.00)
Glucose, Bld: 103 mg/dL — ABNORMAL HIGH (ref 70–99)
Potassium: 4.5 mEq/L (ref 3.5–5.1)
Sodium: 141 mEq/L (ref 135–145)

## 2010-12-05 ENCOUNTER — Encounter (INDEPENDENT_AMBULATORY_CARE_PROVIDER_SITE_OTHER): Payer: Medicare Other

## 2010-12-05 DIAGNOSIS — I498 Other specified cardiac arrhythmias: Secondary | ICD-10-CM

## 2010-12-05 DIAGNOSIS — I428 Other cardiomyopathies: Secondary | ICD-10-CM

## 2010-12-06 ENCOUNTER — Encounter: Payer: Self-pay | Admitting: Internal Medicine

## 2010-12-12 NOTE — Cardiovascular Report (Signed)
Summary: Office Visit   Office Visit   Imported By: Roderic Ovens 12/02/2010 15:37:28  _____________________________________________________________________  External Attachment:    Type:   Image     Comment:   External Document

## 2010-12-23 ENCOUNTER — Encounter: Payer: Self-pay | Admitting: *Deleted

## 2011-01-02 NOTE — Letter (Signed)
Summary: Remote Device Check  [employer], Main Office  [address]   [place], [place] [address]   Phone: [phone]  Fax: [phone]     December 23, 2010 MRN: [id number]   [facility] [address] [place], [place]  [address]   Dear Mr. SWEEDEN,   Your remote transmission was recieved and reviewed by your physician.  All diagnostics were within normal limits for you.  __X___Your next transmission is scheduled for:  03-06-2011.  Please transmit at any time this day.  If you have a wireless device your transmission will be sent automatically.  Sincerely,  Vella Kohler

## 2011-01-02 NOTE — Cardiovascular Report (Signed)
Summary: Office Visit   Office Visit   Imported By: Roderic Ovens 12/24/2010 16:19:01  _____________________________________________________________________  External Attachment:    Type:   Image     Comment:   External Document

## 2011-01-08 LAB — CK TOTAL AND CKMB (NOT AT ARMC)
CK, MB: 1.8 ng/mL (ref 0.3–4.0)
Relative Index: INVALID (ref 0.0–2.5)
Total CK: 53 U/L (ref 7–232)

## 2011-01-08 LAB — DIFFERENTIAL
Band Neutrophils: 0 % (ref 0–10)
Basophils Absolute: 0 10*3/uL (ref 0.0–0.1)
Basophils Relative: 0 % (ref 0–1)
Blasts: 0 %
Eosinophils Absolute: 0.3 10*3/uL (ref 0.0–0.7)
Eosinophils Relative: 5 % (ref 0–5)
Lymphocytes Relative: 66 % — ABNORMAL HIGH (ref 12–46)
Lymphs Abs: 3.9 K/uL (ref 0.7–4.0)
Metamyelocytes Relative: 0 %
Monocytes Absolute: 0.4 10*3/uL (ref 0.1–1.0)
Monocytes Relative: 6 % (ref 3–12)
Myelocytes: 0 %
Neutro Abs: 1.4 10*3/uL — ABNORMAL LOW (ref 1.7–7.7)
Neutrophils Relative %: 23 % — ABNORMAL LOW (ref 43–77)
Promyelocytes Absolute: 0 %
nRBC: 0 /100 WBC

## 2011-01-08 LAB — CBC
HCT: 25.3 % — ABNORMAL LOW (ref 39.0–52.0)
HCT: 29.3 % — ABNORMAL LOW (ref 39.0–52.0)
HCT: 30.3 % — ABNORMAL LOW (ref 39.0–52.0)
Hemoglobin: 10.1 g/dL — ABNORMAL LOW (ref 13.0–17.0)
Hemoglobin: 10.4 g/dL — ABNORMAL LOW (ref 13.0–17.0)
Hemoglobin: 10.5 g/dL — ABNORMAL LOW (ref 13.0–17.0)
MCHC: 34.5 g/dL (ref 30.0–36.0)
MCHC: 34.6 g/dL (ref 30.0–36.0)
MCV: 98.6 fL (ref 78.0–100.0)
Platelets: 48 10*3/uL — CL (ref 150–400)
Platelets: 54 10*3/uL — ABNORMAL LOW (ref 150–400)
Platelets: 64 10*3/uL — ABNORMAL LOW (ref 150–400)
RBC: 2.97 MIL/uL — ABNORMAL LOW (ref 4.22–5.81)
RDW: 18 % — ABNORMAL HIGH (ref 11.5–15.5)
RDW: 18.3 % — ABNORMAL HIGH (ref 11.5–15.5)
RDW: 18.4 % — ABNORMAL HIGH (ref 11.5–15.5)
RDW: 18.4 % — ABNORMAL HIGH (ref 11.5–15.5)
RDW: 18.5 % — ABNORMAL HIGH (ref 11.5–15.5)
WBC: 6 K/uL (ref 4.0–10.5)

## 2011-01-08 LAB — TROPONIN I: Troponin I: 0.04 ng/mL (ref 0.00–0.06)

## 2011-01-08 LAB — TRANSFERRIN: Transferrin: 219 mg/dL (ref 212–360)

## 2011-01-08 LAB — CARDIAC PANEL(CRET KIN+CKTOT+MB+TROPI)
CK, MB: 1.1 ng/mL (ref 0.3–4.0)
CK, MB: 1.3 ng/mL (ref 0.3–4.0)
Relative Index: INVALID (ref 0.0–2.5)
Relative Index: INVALID (ref 0.0–2.5)
Total CK: 42 U/L (ref 7–232)
Total CK: 52 U/L (ref 7–232)
Troponin I: 0.02 ng/mL (ref 0.00–0.06)
Troponin I: 0.02 ng/mL (ref 0.00–0.06)

## 2011-01-08 LAB — COMPREHENSIVE METABOLIC PANEL
ALT: 20 U/L (ref 0–53)
AST: 25 U/L (ref 0–37)
BUN: 23 mg/dL (ref 6–23)
CO2: 22 mEq/L (ref 19–32)
Chloride: 111 mEq/L (ref 96–112)
Glucose, Bld: 140 mg/dL — ABNORMAL HIGH (ref 70–99)
Potassium: 4.1 mEq/L (ref 3.5–5.1)
Sodium: 139 mEq/L (ref 135–145)
Total Bilirubin: 0.7 mg/dL (ref 0.3–1.2)
Total Protein: 7.2 g/dL (ref 6.0–8.3)

## 2011-01-08 LAB — CROSSMATCH
ABO/RH(D): B NEG
Antibody Screen: NEGATIVE

## 2011-01-08 LAB — BASIC METABOLIC PANEL
BUN: 20 mg/dL (ref 6–23)
BUN: 25 mg/dL — ABNORMAL HIGH (ref 6–23)
BUN: 29 mg/dL — ABNORMAL HIGH (ref 6–23)
CO2: 24 mEq/L (ref 19–32)
CO2: 25 mEq/L (ref 19–32)
Calcium: 9.2 mg/dL (ref 8.4–10.5)
Calcium: 9.3 mg/dL (ref 8.4–10.5)
Creatinine, Ser: 1.72 mg/dL — ABNORMAL HIGH (ref 0.4–1.5)
Creatinine, Ser: 1.76 mg/dL — ABNORMAL HIGH (ref 0.4–1.5)
Creatinine, Ser: 1.82 mg/dL — ABNORMAL HIGH (ref 0.4–1.5)
GFR calc non Af Amer: 37 mL/min — ABNORMAL LOW (ref >60)
GFR calc non Af Amer: 37 mL/min — ABNORMAL LOW (ref >60)
GFR calc non Af Amer: 38 mL/min — ABNORMAL LOW (ref >60)
Glucose, Bld: 111 mg/dL — ABNORMAL HIGH (ref 70–99)
Glucose, Bld: 113 mg/dL — ABNORMAL HIGH (ref 70–99)
Glucose, Bld: 117 mg/dL — ABNORMAL HIGH (ref 70–99)
Glucose, Bld: 118 mg/dL — ABNORMAL HIGH (ref 70–99)
Potassium: 3.4 mEq/L — ABNORMAL LOW (ref 3.5–5.1)
Potassium: 4.1 mEq/L (ref 3.5–5.1)
Sodium: 138 mEq/L (ref 135–145)
Sodium: 138 mEq/L (ref 135–145)

## 2011-01-08 LAB — URINE MICROSCOPIC-ADD ON

## 2011-01-08 LAB — PROTIME-INR
INR: 1.19 (ref 0.00–1.49)
INR: 1.22 (ref 0.00–1.49)
Prothrombin Time: 15 s (ref 11.6–15.2)
Prothrombin Time: 15.3 seconds — ABNORMAL HIGH (ref 11.6–15.2)

## 2011-01-08 LAB — URINALYSIS, ROUTINE W REFLEX MICROSCOPIC
Bilirubin Urine: NEGATIVE
Glucose, UA: NEGATIVE mg/dL
Ketones, ur: NEGATIVE mg/dL
Leukocytes, UA: NEGATIVE
Nitrite: NEGATIVE
Protein, ur: 30 mg/dL — AB
Specific Gravity, Urine: 1.02 (ref 1.005–1.030)
Urobilinogen, UA: 0.2 mg/dL (ref 0.0–1.0)
pH: 6 (ref 5.0–8.0)

## 2011-01-08 LAB — GLUCOSE, CAPILLARY
Glucose-Capillary: 102 mg/dL — ABNORMAL HIGH (ref 70–99)
Glucose-Capillary: 103 mg/dL — ABNORMAL HIGH (ref 70–99)
Glucose-Capillary: 104 mg/dL — ABNORMAL HIGH (ref 70–99)
Glucose-Capillary: 107 mg/dL — ABNORMAL HIGH (ref 70–99)
Glucose-Capillary: 109 mg/dL — ABNORMAL HIGH (ref 70–99)
Glucose-Capillary: 110 mg/dL — ABNORMAL HIGH (ref 70–99)
Glucose-Capillary: 111 mg/dL — ABNORMAL HIGH (ref 70–99)
Glucose-Capillary: 117 mg/dL — ABNORMAL HIGH (ref 70–99)
Glucose-Capillary: 120 mg/dL — ABNORMAL HIGH (ref 70–99)
Glucose-Capillary: 120 mg/dL — ABNORMAL HIGH (ref 70–99)
Glucose-Capillary: 121 mg/dL — ABNORMAL HIGH (ref 70–99)
Glucose-Capillary: 136 mg/dL — ABNORMAL HIGH (ref 70–99)
Glucose-Capillary: 148 mg/dL — ABNORMAL HIGH (ref 70–99)
Glucose-Capillary: 98 mg/dL (ref 70–99)

## 2011-01-08 LAB — HEMOGLOBIN A1C
Hgb A1c MFr Bld: 6.3 % — ABNORMAL HIGH (ref 4.6–6.1)
Hgb A1c MFr Bld: 6.3 % — ABNORMAL HIGH (ref 4.6–6.1)
Mean Plasma Glucose: 134 mg/dL

## 2011-01-08 LAB — COMPREHENSIVE METABOLIC PANEL WITH GFR
Albumin: 3.8 g/dL (ref 3.5–5.2)
Alkaline Phosphatase: 53 U/L (ref 39–117)
Calcium: 9.4 mg/dL (ref 8.4–10.5)
Creatinine, Ser: 1.71 mg/dL — ABNORMAL HIGH (ref 0.4–1.5)
GFR calc Af Amer: 46 mL/min — ABNORMAL LOW (ref >60)
GFR calc non Af Amer: 38 mL/min — ABNORMAL LOW (ref >60)

## 2011-01-08 LAB — CULTURE, BLOOD (ROUTINE X 2)
Culture: NO GROWTH
Culture: NO GROWTH

## 2011-01-08 LAB — POCT CARDIAC MARKERS
CKMB, poc: 1.5 ng/mL (ref 1.0–8.0)
Myoglobin, poc: 157 ng/mL (ref 12–200)
Troponin i, poc: <0.05 ng/mL (ref 0.00–0.09)

## 2011-01-08 LAB — MAGNESIUM
Magnesium: 2.2 mg/dL (ref 1.5–2.5)
Magnesium: 2.3 mg/dL (ref 1.5–2.5)

## 2011-01-08 LAB — VITAMIN B12: Vitamin B-12: 399 pg/mL (ref 211–911)

## 2011-01-08 LAB — LIPID PANEL
Cholesterol: 93 mg/dL (ref 0–200)
HDL: 24 mg/dL — ABNORMAL LOW (ref >39)

## 2011-01-08 LAB — CALCIUM: Calcium: 9 mg/dL (ref 8.4–10.5)

## 2011-01-08 LAB — APTT: aPTT: 30 seconds (ref 24–37)

## 2011-01-08 LAB — BRAIN NATRIURETIC PEPTIDE: Pro B Natriuretic peptide (BNP): 1212 pg/mL — ABNORMAL HIGH (ref 0.0–100.0)

## 2011-01-08 LAB — TSH: TSH: 3.358 u[IU]/mL (ref 0.350–4.500)

## 2011-01-08 LAB — PHOSPHORUS: Phosphorus: 3.9 mg/dL (ref 2.3–4.6)

## 2011-01-08 LAB — HEMOCCULT GUIAC POC 1CARD (OFFICE): Fecal Occult Bld: NEGATIVE

## 2011-01-08 LAB — IRON AND TIBC
Iron: 87 ug/dL (ref 42–135)
TIBC: 312 ug/dL (ref 215–435)

## 2011-01-08 LAB — PATHOLOGIST SMEAR REVIEW

## 2011-01-21 LAB — POCT CARDIAC MARKERS
CKMB, poc: 1.1 ng/mL (ref 1.0–8.0)
CKMB, poc: <1 ng/mL — ABNORMAL LOW (ref 1.0–8.0)
Troponin i, poc: <0.05 ng/mL (ref 0.00–0.09)
Troponin i, poc: <0.05 ng/mL (ref 0.00–0.09)

## 2011-01-21 LAB — URINE MICROSCOPIC-ADD ON

## 2011-01-21 LAB — URINALYSIS, ROUTINE W REFLEX MICROSCOPIC
Bilirubin Urine: NEGATIVE
Glucose, UA: NEGATIVE mg/dL
Specific Gravity, Urine: 1.021 (ref 1.005–1.030)
pH: 7 (ref 5.0–8.0)

## 2011-01-21 LAB — CBC
MCHC: 35.2 g/dL (ref 30.0–36.0)
Platelets: 66 10*3/uL — ABNORMAL LOW (ref 150–400)
RDW: 16 % — ABNORMAL HIGH (ref 11.5–15.5)

## 2011-01-21 LAB — DIFFERENTIAL
Basophils Absolute: 0 10*3/uL (ref 0.0–0.1)
Basophils Relative: 0 % (ref 0–1)
Eosinophils Absolute: 0.1 10*3/uL (ref 0.0–0.7)
Eosinophils Relative: 1 % (ref 0–5)
Monocytes Absolute: 0.4 10*3/uL (ref 0.1–1.0)
Monocytes Relative: 8 % (ref 3–12)
Myelocytes: 0 %
Neutro Abs: 0.9 10*3/uL — ABNORMAL LOW (ref 1.7–7.7)
Neutrophils Relative %: 18 % — ABNORMAL LOW (ref 43–77)
nRBC: 0 /100 WBC

## 2011-01-21 LAB — COMPREHENSIVE METABOLIC PANEL
ALT: 15 U/L (ref 0–53)
Albumin: 3.4 g/dL — ABNORMAL LOW (ref 3.5–5.2)
Alkaline Phosphatase: 48 U/L (ref 39–117)
BUN: 22 mg/dL (ref 6–23)
Calcium: 9.9 mg/dL (ref 8.4–10.5)
Potassium: 3.9 mEq/L (ref 3.5–5.1)
Sodium: 141 mEq/L (ref 135–145)
Total Protein: 7.1 g/dL (ref 6.0–8.3)

## 2011-01-21 LAB — POCT I-STAT, CHEM 8
Calcium, Ion: 1.3 mmol/L (ref 1.12–1.32)
Creatinine, Ser: 1.5 mg/dL (ref 0.4–1.5)
Glucose, Bld: 148 mg/dL — ABNORMAL HIGH (ref 70–99)
HCT: 32 % — ABNORMAL LOW (ref 39.0–52.0)
Hemoglobin: 10.9 g/dL — ABNORMAL LOW (ref 13.0–17.0)

## 2011-01-21 LAB — URINE CULTURE

## 2011-02-18 NOTE — Op Note (Signed)
NAMEALIZE, ACY NO.:  [id number]   MEDICAL RECORD NO.:  [id number]          PATIENT TYPE:  AMB   LOCATION:  ENDO                         FACILITY:  [facility]   PHYSICIAN:  Georgiana Spinner, M.D.    DATE OF BIRTH:  12/04/1920   DATE OF PROCEDURE:  DATE OF DISCHARGE:                               OPERATIVE REPORT   PROCEDURE:  Colonoscopy.   INDICATIONS:  Colon polyps, colon cancer.   ANESTHESIA:  Fentanyl 50 mcg, Versed 5 mg.   PROCEDURE:  With the patient mildly sedated in the left lateral  decubitus position, the Pentax videoscopic colonoscope was inserted in  the rectum after normal rectal exam and passed under direct vision to  the neo cecum, which was photographed.  From this point the colonoscope  was slowly withdrawn, taking circumferential views of the colonic  mucosa, stopping only to photograph diverticula seen along the way in  the sigmoid colon until we reached the rectum, which appeared normal on  direct and showed hemorrhoids on retroflexed view.  The endoscope was  straightened and withdrawn.  The patient's vital signs and pulse  oximeter remained stable.  The patient tolerated the procedure well  without apparent complications.   FINDINGS:  Diverticulosis of the sigmoid colon, internal hemorrhoids,  otherwise an unremarkable colonoscopic examination to the neo cecum.   PLAN:  Consider repeat examination if feasible in about 5 years.           ______________________________  Georgiana Spinner, M.D.     GMO/MEDQ  D:  05/19/2008  T:  05/19/2008  Job:  [id number]

## 2011-02-21 NOTE — Discharge Summary (Signed)
[name], [name] NO.:  [id number]   MEDICAL RECORD NO.:  [id number]          PATIENT TYPE:  INP   LOCATION:  2025                         FACILITY:  [facility]   PHYSICIAN:  Evelene Croon, M.D.     DATE OF BIRTH:  [birth year]   DATE OF ADMISSION:  11/02/2006  DATE OF DISCHARGE:  11/06/2006                               DISCHARGE SUMMARY   HISTORY OF PRESENT ILLNESS:  The patient is an 77 year old male referred  to Dr. Laneta Simmers in cardiac surgical consultation.  He is a patient of Dr.  Merri Brunette.  He has a history of the inferior wall scar on Persantine  Cardiolite scan in 2002.  The patient reported a history in the last 6  months or so of having worsening fatigue and dyspnea on exertion.  He  said that he has had aching pain in both shoulders in the upper chest  for least 2 months associated with this.  These symptoms only occurred  with exertion and never presented at rest.  He stated that he had  recently been seen by his urologist, Dr. Vernie Ammons, who heard his aortic  stenosis murmur and suggested to follow-up with cardiology.  He  underwent a Persantine Cardiolite scan on October 09, 2006 that showed  peri-infarct ischemia with a fixed inferior wall defect.  Ejection  fraction was 54%.  He underwent cardiac catheterization on October 20, 2006 which showed severe three-vessel coronary disease.  The LAD had a  70% focal stenosis in the mid segment.  The left circumflex had a 90%  stenosis in the branch.  The right coronary artery had a 95% focal  stenosis in the mid segment just distal to the right ventricular branch,  as well as a 95% stenosis just beyond the posterior descending branch,  compromising several posterolateral branches.  The left ventricular  fraction was 50-60% with mild anterior hypokinesis.  There was also  noted 1+ aortic insufficiency, and aortic valve area was calculated at  1.0270 cm2.  Due to this, he was referred to Dr. Laneta Simmers who  recommended  proceeding with surgical revascularization as well as aortic valve  replacement.   ALLERGIES:  None.   MEDICATIONS PRIOR TO ADMISSION:  1. Omeprazole 20 mg daily.  2. Lisinopril 10 mg daily.  3. Aspirin 81 mg daily.  4. Flomax 0.4 mg daily.  5. Reglan 50 mg every night.   PAST MEDICAL HISTORY:  1. Significant for hypertension.  2. Previous myocardial infarction as noted above.  3. Aortic stenosis as noted above.  4. History of colon resection in 2000 for carcinoma without      recurrence.  5. History of prostate cancer in 1995 treated with radiation and      followed without recurrence by Dr Vernie Ammons.  6. Surgery on his left hand.  7. Surgery on his left eye for cataract removal.  8. Also status post appendectomy.  9. Also history of gastroesophageal reflux and history of peptic ulcer      disease.   FAMILY HISTORY, SOCIAL HISTORY, REVIEW OF SYSTEMS AND PHYSICAL  EXAMINATION:  Please see history and physical at the time of admission.   HOSPITAL COURSE:  The patient was admitted electively on November 02, 2006 and taken to the operating room at which time he underwent the  following procedure:  Coronary artery bypass grafting x6 using a left  internal mammary artery graft to the left anterior descending coronary  artery, sequential saphenous vein graft to diagonal branch of the LAD  and the intermediate coronary artery.  Saphenous vein graft to the  obtuse marginal branch and left circumflex coronary artery, a sequential  saphenous vein graft to the posterior descending and the posterior  lateral branches of the right coronary artery.  Initially, an aortic  valve replacement using a 23-mm St. Jude Biocor porcine bioprosthetic.  The patient tolerated the procedure well and was taken to the surgical  intensive care unit in stable condition.   POSTOPERATIVE HOSPITAL COURSE:  The patient is overall quite well.  He  has had a postoperative anemia requiring  transfusion.  Additionally, he  had a postoperative nonoliguric renal insufficiency.  His peak  creatinine was 2.09.  It is returning to its baseline.  He has had some  moderate volume overload and is getting diuresed for this.  His most  recent hemoglobin and hematocrit dated November 06, 2006 are 8.1 and  23.4, respectively.  He has remained hemodynamically stable.  All  routine lines, monitors and drainage devices have been discontinued in a  standard fashion.  His incisions are healing well without evidence of  infection.  He is tolerating a gradual increase in activity commensurate  for level of postoperative convalescence using standard cardiac  rehabilitation phase 1 modalities.  Oxygen has been weaned, and he  maintained good saturations on room air.  He is afebrile.  He maintains  normal sinus rhythm without significant cardiac dysrhythmias or ectopy.  His overall status is felt to be tentatively stable for discharge the  morning of November 07, 2006 pending morning round reevaluation.  Consideration will be for his anemia and renal insufficiency status as  to how stable this is and also all routine other parameters will be  checked prior to actual decision for discharge.   CONDITION ON DISCHARGE:  Is stable and improving.   CURRENT MEDICATIONS:  1. Toprol XL 25 mg daily.  2. Tylox 1 every 6 hours as needed.  3. Flomax 0.4 mg daily.  4. He is resume his omeprazole.  5. He has been started on Lasix 40 mg daily for 7 days.  6. K-Dur 20 mEq daily for 7 days.  7. Niferex 150 mg daily.   INSTRUCTIONS:  The patient received written instructions regarding  medications, activity, diet, wound care and follow-up.  Followup will be  with Dr. Laneta Simmers in 3 weeks, Dr. Aleen Campi in 2 weeks.   FINAL DIAGNOSES:  Severe three-vessel coronary artery disease as  described.   Other diagnoses include:  1. Aortic stenosis.  2. Postoperative renal insufficiency. 3. Postoperative acute blood loss  anemia.  4. Postoperative volume overload.  5. Postoperative pancytopenia.  6. Other diagnoses as previously listed per the history.      Rowe Clack, P.A.-C.      Evelene Croon, M.D.  Electronically Signed    WEG/MEDQ  D:  11/06/2006  T:  11/06/2006  Job:  [id number]   cc:   Evelene Croon, M.D.  Antionette Char, MD  [name]. [name], M.D.  Veverly Fells. Vernie Ammons, M.D.

## 2011-02-21 NOTE — Op Note (Signed)
NAME:  [name], [name]                   ACCOUNT NO.:  `   MEDICAL RECORD NO.:  [id number]          PATIENT TYPE:  INP   LOCATION:  2308                         FACILITY:  [facility]   PHYSICIAN:  Evelene Croon, M.D.     DATE OF BIRTH:  23-Feb-1921   DATE OF PROCEDURE:  11/02/2006  DATE OF DISCHARGE:                               OPERATIVE REPORT   PREOPERATIVE DIAGNOSES:  1. Severe three vessel coronary artery disease.  2. Severe aortic stenosis.   POSTOPERATIVE DIAGNOSES:  1. Severe three vessel coronary artery disease.  2. Severe aortic stenosis.   OPERATION/PROCEDURE:  Mediastinotomy, extracorporeal circumflex,  coronary artery bypass graft surgery x6 using a left internal mammary  artery graft of the left anterior descending coronary artery, a  sequential saphenous vein graft to the diagonal branch of the LAD and  the intermediate coronary artery.  Saphenous vein graft to the obtuse  marginal branch of the left circumflex coronary artery, and a sequential  saphenous vein graft to the posterior descending and the posterolateral  branches of the right coronary artery.  Aortic valve replacement using a  23 mm St. Jude Biocor porcine heart valve, endoscopic vein harvesting  from the right leg.   ASSISTANT:  Constance Holster, PA-C   ANESTHESIA:  General endotracheal.   CLINICAL HISTORY:  This patient is an 77 year old gentleman who is still  very active who has a history of a prior inferior myocardial infarction  by presenting Cardiolite scan in 2002.  He reports at least a 6 month  history of having worsening fatigue and dyspnea on exertion.  He has had  some aching pain in both shoulders and the upper chest for at least the  last 2 or 3 months.  He was known to have a murmur on exam and underwent  a presenting Cardiolite scan on October 09, 2006, that showed inferior  infarct with periinfarct ischemia.  Ejection fraction was 54%.  He  underwent cardiac catheterization on October 20, 2006 by Dr. Aleen Campi,  which showed severe 3-vessel coronary artery disease.  The LAD had a 70%  focal stenosis in the mid segment.  The left circumflex had a 90%  stenosis in the intermediate branch.  The right coronary artery had 95%  focal stenosis in the mid segment just distal to the right ventricular  branch as well as a 95% stenosis just beyond the posterior descending  branch.  Left ventricular ejection fraction is 50% to 60% with mild  anterior hypokinesis.  There was 1+ aortic insufficiency with an aortic  valve area of 1.03.  After review of the angiogram and echocardiogram  and examination of the patient, it was felt that a coronary artery  bypass graft surgery and aortic valve replacement was the best treatment  to prevent further ischemia and infarction and to prevent worsening  symptoms and complications of aortic stenosis.  I have discussed the  operative procedure with the patient and his wife including  alternatives, benefits, and risks including but not limited to bleeding,  blood transfusion, infection, stroke, myocardial infarction, graft  failure, and death.  We also discussed the possible need for permanent  pacemaker placement.  I discussed the options for valve replacement  including tissue and mechanical valves and strongly recommend a tissue  valve canalization.  He was in agreement with that.   OPERATION/PROCEDURE:  The patient was taken to the operating room and  placed on the table in supine position.  After induction of general  endotracheal anesthesia, a Foley catheter was placed in the bladder  using sterile technique.  Then the chest, abdomen, and both lower  extremities were prepped and draped in the usual sterile manner.  A  transesophageal echocardiogram was performed by anesthesiology.  This  showed severe calcific aortic stenosis.  Left ventricular function was  well-preserved.  There was mild mitral regurgitation.   Then the patient's chest was  opened through a mediastinotomy incision.  The pericardium was open in the midline.  Examination of the heart  showed good ventricular contractility.  The ascending aorta had no  palpable plaques in it.   Then the left internal mammary artery was harvested from the chest wall  as a pedicle graft.  This was a medium caliber vessel with excellent  blood flow through it.  At the same time, the greater saphenous vein was  harvested from the right leg using endoscopic vein harvest technique.  This vein is of medium caliber and quality.   Then the patient was heparinized and when an adequate activate clotting  time was achieved, the distal ascending aorta was cannulated using a 20  French aortic cannula for arterial end flow.  Venous outflow was  achieved using a 2-stage venous cannulas of the right atrial appendage.  An antegrade cardioplegia and vent cannula was inserted in the aortic  root.  The left ventricular vent was placed through the right superior  pulmonary vein.   The patient was placed on cardiopulmonary bypass and distal coronary  artery was identified.  The LAD was a large graftable vessel that was  intramyocardial in its proximal and mid portions, but exited distally  where there was no plaque present.  The diagonal branch was a medium  sized graftable vessel.  The obtuse marginal was a medium to large  caliber vessel that was visible in its proximal to mid portion and then  became intramyocardial.  The right coronary artery gave off a large  posterior descending and a moderate sized first posterolateral branch,  both of which were graftable.   Then the aorta was cross clamped and 1,000 mL of cold blood antegrade  cardioplegia was administered in the aortic root with quick arrest of  the heart.  Systemic hypothermia to 28 degree centigrade and topical hypothermia with iced saline was used.  A temperature probe was placed  in the septum and insulating pad in the pericardium.   Additional doses  of antegrade cardioplegia were given into the aortic root as well as  down the vein grafts approximately at 20 minute intervals to maintain  myocardial temperature around 10 degrees centigrade.  Once the aorta was  opened, cardioplegia was given directly into the left coronary ostium as  well as down the vein grafts.   Then the first distal anastomosis was performed of the obtuse marginal  coronary artery.  The internal diameter of this vessel was about 1.75  mm.  The kind that was used was a 7 mm greater saphenous vein and  anastomosis performed end-to-side manner using continuous 7-0 Prolene  suture.  The flow  was measured through the graft and was excellent.   The 2nd distal anastomosis was performed of the posterior descending  coronary artery.  The internal diameter was also about 1.75 mm.  The  kind that was used was a second segment of the greater saphenous vein  and anastomosis performed at a sequential side-to-side manner using  continuous 7-0 Prolene suture.  Flow was noted through the graft and was  excellent.   The 3rd distal anastomosis was performed of the first posterior lateral  branch.  The internal diameter of this vessel was about 1.6 mm.  The  kind that was used was the same 7 mm greater saphenous vein and the  anastomosis performed at a sequential end-to-side manner using  continuous 7-0 Prolene suture.  Flow was measured through the graft and  was excellent.   A 4th distal anastomosis was performed of the diagonal branch of the  LAD.  The internal diameter was about 1.6 mm.  The kind that was used  was a 37 to the greater saphenous vein and the anastomosis performed in  a sequential side-to-side manner using continuous 7-0 Prolene suture.  Flow was measured through the graft and was excellent.   The 5th distal anastomosis was performed of the intermediate coronary  artery.  This vessel was heavily diseased proximally and then became   intramyocardial.  The internal diameter was about 2 mm.  The kind that  was used was the same 7 mm greater saphenous vein and anastomosis formed  in a sequential end-to-side manner using continuous 7-0 Prolene suture.  Flow was administered through the graft and was excellent.   The 6th distal anastomosis was performed to the distal LAD.  The  internal diameter of this vessel was about 2 mm.  The kind that was used  was the left internal mammary graft and this brought to an opening in  the left pericardium, anterior to the phrenic nerve. It was anastomosed  to the LAD in an end-to-side manner using continuous 8-0 Prolene suture.  The pedicle was sutured to the epicardium using 6-0 Prolene sutures.   Then attention was turned to aortic valve replacement.  The aorta was  opened transversely at the level of sinotubular junction.  Examination of the native valve showed that there were 3 leaflets that were heavily  calcified.  There was moderate aortic annular calcification.  The  leaflets were sixth in position.  The native valve was excised.  Care  was taken to remove all particular debris.  The annulus was decalcified  with rongeur's.  The aortic root and left ventricle were irrigated with  saline solution and inspected to be sure that there was no debris  present.  Then the annulus was sized and a 25 mm St. Jude Bicor porcine  valve was chosen.  This had model #B10-25A-00, serial [id number].  Then  while this was being prepared, a series of 2 Ethibond horizontal  mattress sutures were placed around the annulus with the pledgets in a  subannular position.  The sutures were then placed with a sewing ring  and a valve lowered in placed.  The suture was tied sequentially.  The  valve seated nicely.  The coronary ostium were non-obstructed.  Then the  patient was rewarmed to 37 degree centigrade.  The aorta was closed in 2  layers using continuous 4-0 Prolene suture.  This aortotomy was  lightly  coated by BioGlue for hemostasis.  Then put the cross clamp in place.  The proximal anastomosis of the obtuse marginal and right coronary vein  grafts were performed directly to the aortic root in an end-to-side  manner using continuous 6-0 Prolene suture.  The vein graft to the  diagonal and intermediate was a little short to reach the aorta  comfortably and therefore was placed end-to-side to the proximal portion  of the obtuse marginal vein graft in an end-to-side manner using  continuous 7-0 Prolene suture.  Then the clamp was removed from the  mammary pedicle.  It was wrap and warming at the ventricular septum and  returned spontaneous ventricular fibrillation.  The left side of the  heart was deaired and then the head placed in the Trendelenburg  position.  The cross clamp was removed with time 153 minutes.  There was  spontaneous return of sinus rhythm.  The proximal and distal anastomoses  appeared hemostatic and lie of the grafts satisfactory.  The graft  margins were placed around the proximal anastomosis.  Two temporary  right ventricular and right atrial paced wires were placed and bolted to  through the skin.   The patient was rewarmed at 37 degrees centigrade.  He was bleeding from  a cardiopulmonary bypass on low dose dopamine.  Total bypass time was  184 minutes.  Cardiac function appeared excellent with a cardiac output  of 5 liters.  A transesophageal echocardiogram was performed and showed  the normal functioning aortic valve prosthesis without any evidence of  regurgitation or perivalvular leak.  There was mild residual mitral  regurgitation.  Left ventricular function was well preserved.  Then  protamine was given and the venus and aortic cannulas were removed  without difficulty.  Hemostasis was achieved with some difficulty due to coagulopathy.  We did give 20 units of platelets due to thrombocytopenia  with a platelet count at 50,000 as well as 2 units of  fresh frozen  plasma for ongoing coagulopathy.  This resolved in an adequate  hemostasis.  Three chest tubes were placed, in the posterior  pericardium, one left pleural space and one anterior mediastinum.  The  sternum was then closed with #6 stainless steel wires.  The fascia was  closed with continuous #1 Vicryl suture.  Subcutaneous tissue was closed  with continuous 2-0 Vicryl and the skin with 3-0 Vicryl subcuticular  closure.  Large saphenous vein harvest site was  closed in layers in a similar manner.  The sponge, needle, and  instrument counts were correct according to the scrub nurse.  Dry  sterile dressings were applied over the incisions, around the chest  tubes, which were suctioned.  The patient remained hemodynamically  stable and was transported to the SICU in guarded, but stable condition.      Evelene Croon, M.D.  Electronically Signed     BB/MEDQ  D:  11/02/2006  T:  11/03/2006  Job:  [id number]   cc:   Antionette Char, MD  Cardiac Cath Lab

## 2011-02-21 NOTE — Op Note (Signed)
[place]. [facility]  Patient:    [name]                       MRN: [id number] Proc. Date: 10/11/99 Adm. Date:  [id number] Attending:  Armandina Gemma CC:         Armandina Gemma, M.D.             Sabino Gasser, M.D.                           Operative Report  PREOPERATIVE DIAGNOSIS:  Cecal mass, likely carcinoma.  POSTOPERATIVE DIAGNOSIS:  Cecal mass, likely carcinoma.  OPERATION:  Right hemicolectomy.  SURGEON:  Jimmye Norman, M.D.  ASSISTANT:  Abigail Miyamoto, M.D.  ANESTHESIA:  General endotracheal anesthesia.  ESTIMATED BLOOD LOSS:  Less than 100 cc.  COMPLICATIONS:  None.  CONDITION:  Stable.  INDICATION:  The patient is a 77 year old gentleman recently admitted with lower GI bleed.  On colonoscope found to have a large cecal tumor likely counting for his bleeding, likely a carcinoma.  FINDINGS:  The patient had a palpable 5 cm intraluminal cecal mass.  Grossly, the margins were negative after resection.  There was no evidence of liver, pancreatic, or splenic lesions.  DESCRIPTION OF PROCEDURE:  The patient was taken to the operating room and placed on the table in the supine position.  After an adequate general anesthetic was administered, he was prepped and draped in the usual sterile manner exposing the midline and right lower quadrant of the abdomen.  This tumor was resected via a right transverse, transverse rectus incision.  A 10 blade was used to make the initial excision approximately 8 cm long and it was extended to 2 cm long after we entered the peritoneal cavity.  It was taken across the anterior rectus sheath and was subsequently electrocautery was used to transect the rectus muscle on the right side.  We got into the posterior rectus sheath and subsequently incised it laterally and medially all the way to the midline laterally extending pass the border of the lateral rectus sheath.  We put a Balfour  retractor in place and we were able to  mobilize the colon where the palpable lesion was found.  We were able to take down the cecum from its peritoneal attachments at the line of Toldt and also detaching the terminal ileum and cecum.  We detached the descending colon from its peritoneal attachments at the line of Toldt and subsequently came around the hepatic flexure.  Using a right inguinal clamp, electrocautery, and lso 2-0 and 3-0 silk ties.  We mobilized the right mid-transverse colon from its omental attachments, split the omentum down to where we to resect the colon, and then subsequently transverse the colon just on the lateral side of the middle colic vessels.  We took down the omental attachments and also the attachments of the mesentery o the gastric margin and the greater curvature.  With both the terminal ileum which was transected using the GIA-55 stapler and the mid-transverse colon, we were able to come across the mesentery between the transected ends using Kelly clamps and 2-0 silk ties.  For the large vessels, the ileocolic and right colic vessels were easily double ligated with 2-0 silk ties.  Once the mesentery was removed, the specimen freed, we sent it to pathology where they confirmed that we had adequate clear margins at least 5 to  6 cm on the proximal side and 20+ cm on the distal side.  We then inspected the abdomen prior to transection of the liver and there were o palpable lesions in the liver, in the lesser sac, pancreas, or the spleen.  We performed a side-to-side functional end-to-end anastomosis between the mid-transverse colon and the terminal ileum using the GIA-55 stapler with the resulting enterotomy being closed with a TA-66 stapler.  Once this was done, the resulting mesenteric defect was closed using interrupted 3-0 silk popoff sutures.  We irrigated with saline.  The electrocautery was used to obtain hemostasis. There were  hemoclips that were required for some of the bleeding in the lateral peritoneal recesses.  Once this was done and we had adequate hemostasis, we closed the abdomen in several layers.  The posterior rectus sheath was reapproximated using a running 0 PDS suture.  This included the lateral rectus sheath and the internal oblique and transverse abdominal muscles laterally.  The anterior rectus sheaths were closed using interrupted figure-of-eight stitches of #1 Vicryl.  We irrigated each with saline.  There was no spillage during the  case.  Subcutaneous was irrigated sterilely and the skin was closed using stainless steel staples.  Prior to closing the skin, we injected 0.5% Marcaine without epinephrine into the subcutaneous and the subcuticular space.  Sterile dressings were applied.DD:  10/11/99 TD:  10/12/99 Job: [id number]

## 2011-02-21 NOTE — Discharge Summary (Signed)
. [facility]  Patient:    [name]                       MRN: [id number] Adm. Date:  [id number] Attending:  Armandina Gemma Dictator:   Jimmye Norman, M.D. CC:         Trudee Kuster, M.D.             Samul Dada, M.D.             Helene Kelp, M.D.             Sabino Gasser, M.D.                           Discharge Summary  DISCHARGE DIAGNOSIS: 1.  Cecal carcinoma, stage 3, without lymph node involvement.  PROCEDURE: 1.  Colonoscopy. 2.  Right hemicolectomy.  SURGEON:  Dr. Lindie Spruce.  DISCHARGE MEDICATIONS: 1.  Vicodin 1 to 2 tablets q.4h. p.r.n. pain. 2.  Prevacid 15 mg p.o. q.d.  DISCHARGE DIET:  Regular.  DISCHARGE CONDITION:  Stable.  DISPOSITION:  Follow-up to see me in two weeks.  He also has a follow-up appointment to see Dr. Arline Asp.  HOSPITAL COURSE:  The patient was admitted with GI bleed on October 07, 1999 and had an upper and lower GI endoscopy.  The lower GI endoscopy demonstrated a cecal mass or carcinoma.  He was referred to me for surgical evaluation after having ad a bowel prep and underwent right hemicolectomy the following day.  The pathology from the right hemicolectomy demonstrates invasive moderately differentiated carcinoma with extension through the muscularis propria with 16 lymph nodes negative for tumor.  He has a stage 2 tumor.  Postoperatively he did well.  He actually started having bowel movements on postoperative day #3 and was started on a diet, although he did have intermittent nausea; no vomiting.  He also had some difficulty urinating but he had been off his Cardura which he had been taking for urinary hesitancy prior to surgery.  Once he was back on his medication his symptoms resolved, and he was discharged home tolerating a diet well although still having frequent bowel movements.  This should improve over time as the absorptive capacity of the remaining colon improves. DD:   10/17/99 TD:  10/17/99 Job: [id number]

## 2011-02-21 NOTE — Discharge Summary (Signed)
[name], [name] NO.:  [id number]   MEDICAL RECORD NO.:  [id number]          PATIENT TYPE:  INP   LOCATION:  2025                         FACILITY:  [facility]   PHYSICIAN:  Evelene Croon, M.D.     DATE OF BIRTH:  1921-02-08   DATE OF ADMISSION:  11/02/2006  DATE OF DISCHARGE:                               DISCHARGE SUMMARY   ADDENDUM:  Patient was originally felt to be tentatively stable for  discharge on or about November 07, 2006.  He however continued to be slow  with his overall recovery.  He was noted to have anemia requiring  further transfusion.  He was also noted to have heme positive stools.  He had no further decline in his hemoglobin and hematocrit.  He showed a  good and gradual recovery in all other parameters.  He did have an  episode of recurrent atrial fibrillation on November 09, 2006, but this  has not occurred with the addition of increase in his beta blocker.  His  creatinine is continue to show improvement.  Currently it is at 1.6.  His white blood cell count is low at 1.9 on November 09, 2006.  Platelet  count was 97,000 but these are stable postoperative values for him.  He  has no evidence of infection.  He is on no heparin.  Overall, the  patient's status is felt to be tentatively stable for discharge November 11, 2006.   MEDICATIONS:  Currently are:  Lopressor 25 mg q.8 hours.  His other medications are as previously  listed per the other dictation.   FINAL DIAGNOSES:  As previously dictated.  Additionally:  1. Postoperative anemia.  2. Postoperative atrial fibrillation.  3. Severe three vessel coronary artery disease.  4. Severe aortic stenosis.  5. Postoperative pancytopenia.  6. History of hypertension.  7. History of previous myocardial infarction.  8. History of colon resection in 2000.  9. History of carcinoma without recurrence.  10.History of prostate cancer 1995 treated with radiation.  11.History of left hand surgery.  12.History of left eye cataract removal.  13.Previous appendectomy.  14.History of gastroesophageal reflux.  15.History of peptic ulcer disease.   INSTRUCTIONS:  The patient will have an appointment to call for the  surgeon's office.  Additionally, the patient will be follow up with Dr.  Aleen Campi in two weeks.  He is instructed to arrange his appointment.  The patient will also receive written instructions in regard to  medications, activity, diet, and wound care.      Rowe Clack, P.A.-C.      Evelene Croon, M.D.  Electronically Signed    WEG/MEDQ  D:  11/10/2006  T:  11/11/2006  Job:  [id number]   cc:   Evelene Croon, M.D.  Antionette Char, MD

## 2011-02-21 NOTE — Op Note (Signed)
[place]. [facility]  Patient:    [name]                       MRN: [id number] Proc. Date: 10/10/99 Adm. Date:  [id number] Attending:  Armandina Gemma                           Operative Report  PROCEDURE:  Colonoscopy.  INDICATIONS:  Hemoccult positivity.  ANESTHESIA:  Demerol 30 mg, Versed 3 mg in divided doses.  DESCRIPTION OF PROCEDURE:  With the patient mildly sedated in the left lateral decubitus position, the Olympus videoscopic colonoscope was inserted in the rectum and passed under direct vision to the cecum.  Cecum was identified by the ileocecal valve; in this area there was some active bleeding noted from a large cecal mass. This was photographed and biopsied.  The colonoscope was then slowly withdrawn, taking circumferential views of the entire colonic mucosa, stopping only in the rectum (which appeared normal and showed internal hemorrhoids in retroflex view).  The colonoscope was straightened and withdrawn.  The patients vital signs and pulse oximetry remained stable.  The patient tolerated the procedure well without apparent complications.  FINDINGS:  Rear diverticulum and sigmoid cecal mass, small internal hemorrhoid;  otherwise unremarkable colonic examination.  PLAN:  Will get surgical evaluation. DD:  10/10/99 TD:  10/11/99 Job: [id number]

## 2011-02-21 NOTE — Procedures (Signed)
[facility]  Patient:    [name], [name]                      MRN: [id number] Proc. Date: 11/30/00 Adm. Date:  [id number] Attending:  Sabino Gasser                           Procedure Report  PROCEDURE:  Colonoscopy.  INDICATIONS:  Colon cancer.  ANESTHESIA:  Demerol 70 mg, Versed 5 mg.  DESCRIPTION OF PROCEDURE:  With the patient mildly sedated in the left lateral decubitus position, the Olympus videoscopic colonoscope was inserted into the rectum and passed under direct vision to the neocecum, identified by termination of the colonic lumen that had been created. From that point, the colonoscope was slowly withdrawn taking circumferential views of the entire colonic mucosa and stump.  We moved into the rectum where at about 15 cm from the anal verge a polyp was seen, photographed and removed using hot biopsy forceps technique on a setting of blended current. The ________ was _____ otherwise in direct view and showed _________ in retroflexed view. The scope was straightened and withdrawn. The patients vital signs and pulse oximeter remained stable.  The patient tolerated the procedure well without apparent complications.  FINDINGS:  Small polyp at 15 cm otherwise, unremarkable examination to the neocecum.  PLAN:  Await biopsy report. The patient will call me for results and follow-up with me as an outpatient. DD:  11/30/00 TD:  12/01/00 Job: [id number]

## 2011-02-21 NOTE — Op Note (Signed)
   NAME:  [name], [name]                         ACCOUNT NO.:  [id number]   MEDICAL RECORD NO.:  [id number]                   PATIENT TYPE:  AMB   LOCATION:  ENDO                                 FACILITY:  [facility]   PHYSICIAN:  Georgiana Spinner, M.D.                 DATE OF BIRTH:  1921-04-26   DATE OF PROCEDURE:  02/02/2003  DATE OF DISCHARGE:                                 OPERATIVE REPORT   PROCEDURE PERFORMED:  Colonoscopy.   ENDOSCOPIST:  Georgiana Spinner, M.D.   INDICATIONS FOR PROCEDURE:  Colon cancer.   ANESTHESIA:  Demerol 50 mg, Versed 5 mg.   DESCRIPTION OF PROCEDURE:  With the patient mildly sedated in the left  lateral decubitus position, the Olympus video colonoscope was inserted in  the rectum and passed under direct vision to the neo cecum, which was  photographed.  From this point the colonoscope was slowly withdrawn taking  circumferential views of the entire colonic mucosa visualized, stopping only  in the rectum which appeared normal on direct and showed hemorrhoids on  retroflex view.  The endoscope was straightened and withdrawn.  The  patient's vital signs and pulse oximeter remained stable.  The patient  tolerated the procedure well without apparent complications.   FINDINGS:  Internal hemorrhoids and some scattered diverticula throughout  the colon, mostly on the left side.  Otherwise unremarkable colonoscopic  examination to the neo cecum.   PLAN:  If relevant and appropriate, consider repeat examination in five  years.                                                 Georgiana Spinner, M.D.    GMO/MEDQ  D:  02/02/2003  T:  02/02/2003  Job:  [id number]   cc:   Jimmye Norman III, M.D.  [address]  [place]  [place] [address]  Fax: [phone]   Samul Dada, M.D.  [address] [facility]  [place]  [place] [address]  Fax: [phone]

## 2011-02-21 NOTE — Op Note (Signed)
NAME:  [name], [name]                         ACCOUNT NO.:  [id number]   MEDICAL RECORD NO.:  [id number]                   PATIENT TYPE:  AMB   LOCATION:  ENDO                                 FACILITY:  [facility]   PHYSICIAN:  Georgiana Spinner, M.D.                 DATE OF BIRTH:  09-20-1921   DATE OF PROCEDURE:  DATE OF DISCHARGE:                                 OPERATIVE REPORT   PROCEDURE:  Upper endoscopy.   PROCEDURE:  With patient mildly sedated in the left lateral decubitus  position, the Olympus videoscopic endoscope was inserted into the mouth and  advanced under direct vision through the esophagus, which appeared normal  until it reached the distal esophagus, and there was a question of Barrett's  esophagus but it could not be well seen, but we photographed and biopsied  this area.  We entered into the stomach.  The fundus appeared thickened.  The fundus was photographed and biopsied.  The body and antrum, duodenal  bulb, second portion of the duodenum appeared normal.  From this point, the  endoscope was slowly withdrawn taking circumferential views of the duodenal  mucosa.  The endoscope was then pulled back into the stomach, placed in  retroflexion to view the stomach from below.  The endoscope was straightened  and withdrawn, taking circumferential views of the gastric and esophageal  mucosa.  The patient's vital signs and pulse oximetry remained stable.   The patient tolerated the procedure well without apparent complications.   FINDINGS:  Hiatal hernia ___________ GE junction at the endoscope,  photographed.  Question of Barrett's esophagus beginning at the gastric  fundus.   PLAN:  Await biopsy report.  The patient will call me for the results and  follow up with me as an outpatient.  Continue Prilosec.                                               Georgiana Spinner, M.D.    GMO/MEDQ  D:  02/13/2004  T:  02/13/2004  Job:  [id number]

## 2011-03-06 ENCOUNTER — Ambulatory Visit (INDEPENDENT_AMBULATORY_CARE_PROVIDER_SITE_OTHER): Payer: Medicare Other | Admitting: *Deleted

## 2011-03-06 DIAGNOSIS — I498 Other specified cardiac arrhythmias: Secondary | ICD-10-CM

## 2011-03-07 ENCOUNTER — Other Ambulatory Visit: Payer: Self-pay

## 2011-03-12 NOTE — Progress Notes (Signed)
PACER REMOTE 

## 2011-04-06 ENCOUNTER — Encounter: Payer: Self-pay | Admitting: *Deleted

## 2011-04-16 ENCOUNTER — Encounter: Payer: Self-pay | Admitting: Internal Medicine

## 2011-06-05 ENCOUNTER — Ambulatory Visit (INDEPENDENT_AMBULATORY_CARE_PROVIDER_SITE_OTHER): Payer: Medicare Other | Admitting: *Deleted

## 2011-06-05 ENCOUNTER — Other Ambulatory Visit: Payer: Self-pay | Admitting: Internal Medicine

## 2011-06-05 DIAGNOSIS — R001 Bradycardia, unspecified: Secondary | ICD-10-CM

## 2011-06-05 DIAGNOSIS — I498 Other specified cardiac arrhythmias: Secondary | ICD-10-CM

## 2011-06-05 LAB — REMOTE PACEMAKER DEVICE
AL AMPLITUDE: 2.6 mv
AL THRESHOLD: 0.625 V
DEVICE MODEL PM: 2317387
LV LEAD IMPEDENCE PM: 730 Ohm
LV LEAD THRESHOLD: 1.25 V
RV LEAD AMPLITUDE: 12 mv
RV LEAD THRESHOLD: 0.75 V

## 2011-06-20 NOTE — Progress Notes (Signed)
Pacer checked by remote 

## 2011-07-03 ENCOUNTER — Encounter: Payer: Self-pay | Admitting: *Deleted

## 2011-07-24 ENCOUNTER — Encounter: Payer: Self-pay | Admitting: Internal Medicine

## 2011-07-31 ENCOUNTER — Encounter: Payer: Self-pay | Admitting: Internal Medicine

## 2011-07-31 ENCOUNTER — Ambulatory Visit (INDEPENDENT_AMBULATORY_CARE_PROVIDER_SITE_OTHER): Payer: Medicare Other | Admitting: Internal Medicine

## 2011-07-31 VITALS — BP 154/66 | HR 66 | Ht 70.0 in | Wt 212.4 lb

## 2011-07-31 DIAGNOSIS — I2510 Atherosclerotic heart disease of native coronary artery without angina pectoris: Secondary | ICD-10-CM

## 2011-07-31 DIAGNOSIS — I495 Sick sinus syndrome: Secondary | ICD-10-CM

## 2011-07-31 DIAGNOSIS — I4891 Unspecified atrial fibrillation: Secondary | ICD-10-CM

## 2011-07-31 DIAGNOSIS — I5032 Chronic diastolic (congestive) heart failure: Secondary | ICD-10-CM

## 2011-07-31 DIAGNOSIS — Z950 Presence of cardiac pacemaker: Secondary | ICD-10-CM

## 2011-07-31 DIAGNOSIS — I509 Heart failure, unspecified: Secondary | ICD-10-CM

## 2011-07-31 LAB — PACEMAKER DEVICE OBSERVATION
AL IMPEDENCE PM: 287.5 Ohm
ATRIAL PACING PM: 21
BAMS-0001: 150 {beats}/min
BAMS-0003: 70 {beats}/min
BATTERY VOLTAGE: 2.9629 V
RV LEAD AMPLITUDE: 9.1 mv
VENTRICULAR PACING PM: 95

## 2011-07-31 NOTE — Assessment & Plan Note (Signed)
26% atrially paced

## 2011-07-31 NOTE — Assessment & Plan Note (Signed)
He is doing really quite well He is 95% by ventricularly paced

## 2011-07-31 NOTE — Assessment & Plan Note (Signed)
holding sinus rhythm

## 2011-07-31 NOTE — Assessment & Plan Note (Signed)
No significant chest pain. We'll not pursue further evaluation at this time

## 2011-07-31 NOTE — Patient Instructions (Signed)
Your physician has recommended you make the following change in your medication:  1) Stop lisinopril.  Remote monitoring is used to monitor your Pacemaker of ICD from home. This monitoring reduces the number of office visits required to check your device to one time per year. It allows [place] to keep an eye on the functioning of your device to ensure it is working properly. You are scheduled for a device check from home on 10/30/11. You may send your transmission at any time that day. If you have a wireless device, the transmission will be sent automatically. After your physician reviews your transmission, you will receive a postcard with your next transmission date.  Your physician wants you to follow-up in: 1 year with Dr. Graciela Husbands. You will receive a reminder letter in the mail two months in advance. If you don't receive a letter, please call our office to schedule the follow-up appointment.

## 2011-07-31 NOTE — Assessment & Plan Note (Signed)
The patient's device was interrogated.  The information was reviewed. No changes were made in the programming.    

## 2011-07-31 NOTE — Progress Notes (Signed)
  HPI  [name] is a 77 y.o. male is seen following CRT P. implantation for symptomatic bradycardia in this state of ischemic heart disease prior bypass surgery status post bioprosthetic aortic valve and ejection fraction of 40%.  He had paroxysmal atrial fibrillation associated with congestive heart failure   He has been doing very well however. He is not a 30 foot [place] for his drain pipe. He was concerned that he was short of breath afterwards. The next day when I raised his yard for an hour.   Interestingly hissurface QRS d is 112 ms      Review of prior data demonstrates that his echo cardiogram last fall demonstrated normal left ventricular function   Past Medical History  Diagnosis Date  . Atrial fibrillation   . CAD (coronary artery disease)   . Aortic stenosis   . Hypertension   . Colon cancer   . Prostate cancer   . GERD (gastroesophageal reflux disease)   . Cardiac pacemaker in situ     biventricular [employer]. Jude    Past Surgical History  Procedure Date  . Insert / replace / remove pacemaker     biventricular, [employer]. Jude  . Coronary artery bypass graft   . Aortic valve replacement   . Left eye cataract extraction   . Appendectomy   . Hemicolectomy 2000    right    Current Outpatient Prescriptions  Medication Sig Dispense Refill  . carvedilol (COREG CR) 20 MG 24 hr capsule Take 20 mg by mouth daily.        . furosemide (LASIX) 20 MG tablet Take 20 mg by mouth daily.        Marland Kitchen lisinopril (PRINIVIL,ZESTRIL) 10 MG tablet Take 5 mg by mouth daily.        . mirtazapine (REMERON) 15 MG tablet Take 15 mg by mouth at bedtime.        Marland Kitchen omeprazole (PRILOSEC) 20 MG capsule Take 20 mg by mouth daily.        . simvastatin (ZOCOR) 40 MG tablet Take 40 mg by mouth at bedtime.        . Tamsulosin HCl (FLOMAX) 0.4 MG CAPS Take 0.4 mg by mouth daily.        Marland Kitchen warfarin (COUMADIN) 5 MG tablet Take 5 mg by mouth daily.          No Known Allergies  Review of Systems  negative except from HPI and PMH  Physical Exam Well developed and well nourished in no acute distress HENT normal E scleral and icterus clear Neck Supple Clear to ausculation Regular rate and rhythm, no murmurs gallops or rub Soft with active bowel sounds No clubbing cyanosis and edema Alert and oriented, grossly normal motor and sensory function; although he walks with a cane Skin Warm and Dry   Assessment and  Plan

## 2011-09-29 ENCOUNTER — Encounter: Payer: Self-pay | Admitting: Internal Medicine

## 2011-10-30 ENCOUNTER — Encounter: Payer: Medicare Other | Admitting: *Deleted

## 2011-10-30 ENCOUNTER — Encounter: Payer: Self-pay | Admitting: Internal Medicine

## 2011-10-30 ENCOUNTER — Ambulatory Visit (INDEPENDENT_AMBULATORY_CARE_PROVIDER_SITE_OTHER): Payer: Medicare Other | Admitting: *Deleted

## 2011-10-30 DIAGNOSIS — I495 Sick sinus syndrome: Secondary | ICD-10-CM

## 2011-10-30 DIAGNOSIS — I4891 Unspecified atrial fibrillation: Secondary | ICD-10-CM

## 2011-10-30 DIAGNOSIS — Z950 Presence of cardiac pacemaker: Secondary | ICD-10-CM

## 2011-10-30 LAB — REMOTE PACEMAKER DEVICE
AL IMPEDENCE PM: 260 Ohm
ATRIAL PACING PM: 15
BAMS-0001: 150 {beats}/min
BAMS-0003: 70 {beats}/min
LV LEAD IMPEDENCE PM: 650 Ohm
RV LEAD AMPLITUDE: 12 mv
RV LEAD IMPEDENCE PM: 530 Ohm
VENTRICULAR PACING PM: 98

## 2011-11-03 ENCOUNTER — Encounter: Payer: Self-pay | Admitting: Internal Medicine

## 2011-11-04 ENCOUNTER — Encounter: Payer: Self-pay | Admitting: *Deleted

## 2011-11-04 NOTE — Progress Notes (Signed)
Remote pacer check  

## 2011-11-05 ENCOUNTER — Telehealth: Payer: Self-pay | Admitting: Hematology & Oncology

## 2011-11-05 NOTE — Telephone Encounter (Signed)
Pt aware of 2-13 appointment °

## 2011-11-11 ENCOUNTER — Emergency Department (HOSPITAL_COMMUNITY): Payer: Medicare Other

## 2011-11-11 ENCOUNTER — Encounter (HOSPITAL_COMMUNITY): Payer: Self-pay | Admitting: *Deleted

## 2011-11-11 ENCOUNTER — Inpatient Hospital Stay (HOSPITAL_COMMUNITY)
Admission: EM | Admit: 2011-11-11 | Discharge: 2011-11-13 | DRG: 812 | Disposition: A | Discharge status: Home / Self Care | Payer: Medicare Other | Attending: Internal Medicine | Admitting: Internal Medicine

## 2011-11-11 ENCOUNTER — Other Ambulatory Visit: Payer: Self-pay

## 2011-11-11 DIAGNOSIS — D696 Thrombocytopenia, unspecified: Secondary | ICD-10-CM | POA: Diagnosis present

## 2011-11-11 DIAGNOSIS — K219 Gastro-esophageal reflux disease without esophagitis: Secondary | ICD-10-CM | POA: Diagnosis present

## 2011-11-11 DIAGNOSIS — Z85038 Personal history of other malignant neoplasm of large intestine: Secondary | ICD-10-CM

## 2011-11-11 DIAGNOSIS — Z950 Presence of cardiac pacemaker: Secondary | ICD-10-CM | POA: Diagnosis present

## 2011-11-11 DIAGNOSIS — I25810 Atherosclerosis of coronary artery bypass graft(s) without angina pectoris: Secondary | ICD-10-CM | POA: Diagnosis present

## 2011-11-11 DIAGNOSIS — Z8546 Personal history of malignant neoplasm of prostate: Secondary | ICD-10-CM

## 2011-11-11 DIAGNOSIS — I5032 Chronic diastolic (congestive) heart failure: Secondary | ICD-10-CM

## 2011-11-11 DIAGNOSIS — I2510 Atherosclerotic heart disease of native coronary artery without angina pectoris: Secondary | ICD-10-CM | POA: Diagnosis present

## 2011-11-11 DIAGNOSIS — I509 Heart failure, unspecified: Secondary | ICD-10-CM | POA: Diagnosis present

## 2011-11-11 DIAGNOSIS — I4891 Unspecified atrial fibrillation: Secondary | ICD-10-CM | POA: Diagnosis present

## 2011-11-11 DIAGNOSIS — D649 Anemia, unspecified: Principal | ICD-10-CM | POA: Diagnosis present

## 2011-11-11 DIAGNOSIS — R5383 Other fatigue: Secondary | ICD-10-CM | POA: Diagnosis present

## 2011-11-11 DIAGNOSIS — I495 Sick sinus syndrome: Secondary | ICD-10-CM

## 2011-11-11 DIAGNOSIS — N289 Disorder of kidney and ureter, unspecified: Secondary | ICD-10-CM | POA: Diagnosis present

## 2011-11-11 DIAGNOSIS — N183 Chronic kidney disease, stage 3 unspecified: Secondary | ICD-10-CM | POA: Diagnosis present

## 2011-11-11 DIAGNOSIS — Z954 Presence of other heart-valve replacement: Secondary | ICD-10-CM

## 2011-11-11 DIAGNOSIS — I5022 Chronic systolic (congestive) heart failure: Secondary | ICD-10-CM | POA: Diagnosis present

## 2011-11-11 DIAGNOSIS — R42 Dizziness and giddiness: Secondary | ICD-10-CM

## 2011-11-11 DIAGNOSIS — R0602 Shortness of breath: Secondary | ICD-10-CM

## 2011-11-11 DIAGNOSIS — Z7901 Long term (current) use of anticoagulants: Secondary | ICD-10-CM

## 2011-11-11 DIAGNOSIS — I10 Essential (primary) hypertension: Secondary | ICD-10-CM | POA: Diagnosis present

## 2011-11-11 HISTORY — DX: Reserved for inherently not codable concepts without codable children: IMO0001

## 2011-11-11 HISTORY — DX: Encounter for other specified aftercare: Z51.89

## 2011-11-11 HISTORY — DX: Ischemic cardiomyopathy: I25.5

## 2011-11-11 HISTORY — DX: Shortness of breath: R06.02

## 2011-11-11 HISTORY — DX: Anemia, unspecified: D64.9

## 2011-11-11 LAB — URINALYSIS, ROUTINE W REFLEX MICROSCOPIC
Bilirubin Urine: NEGATIVE
Glucose, UA: NEGATIVE mg/dL
Ketones, ur: NEGATIVE mg/dL
pH: 6.5 (ref 5.0–8.0)

## 2011-11-11 LAB — CBC
HCT: 23.4 % — ABNORMAL LOW (ref 39.0–52.0)
MCH: 29.9 pg (ref 26.0–34.0)
MCV: 97.1 fL (ref 78.0–100.0)
RBC: 2.41 MIL/uL — ABNORMAL LOW (ref 4.22–5.81)
RDW: 19.8 % — ABNORMAL HIGH (ref 11.5–15.5)
WBC: 9.2 10*3/uL (ref 4.0–10.5)

## 2011-11-11 LAB — COMPREHENSIVE METABOLIC PANEL
ALT: 10 U/L (ref 0–53)
Alkaline Phosphatase: 85 U/L (ref 39–117)
BUN: 31 mg/dL — ABNORMAL HIGH (ref 6–23)
CO2: 21 mEq/L (ref 19–32)
GFR calc Af Amer: 35 mL/min — ABNORMAL LOW (ref >90)
GFR calc non Af Amer: 30 mL/min — ABNORMAL LOW (ref >90)
Glucose, Bld: 112 mg/dL — ABNORMAL HIGH (ref 70–99)
Potassium: 4.1 mEq/L (ref 3.5–5.1)
Sodium: 137 mEq/L (ref 135–145)

## 2011-11-11 LAB — FERRITIN: Ferritin: 35 ng/mL (ref 22–322)

## 2011-11-11 LAB — URINE MICROSCOPIC-ADD ON

## 2011-11-11 MED ORDER — PANTOPRAZOLE SODIUM 40 MG PO TBEC
40.0000 mg | DELAYED_RELEASE_TABLET | Freq: Every day | ORAL | Status: DC
  Administered 2011-11-11 – 2011-11-13 (×3): 40 mg via ORAL
  Filled 2011-11-11 (×3): qty 1

## 2011-11-11 MED ORDER — FUROSEMIDE 10 MG/ML IJ SOLN
40.0000 mg | Freq: Two times a day (BID) | INTRAMUSCULAR | Status: DC
  Filled 2011-11-11 (×2): qty 4

## 2011-11-11 MED ORDER — SODIUM CHLORIDE 0.9 % IJ SOLN
3.0000 mL | Freq: Two times a day (BID) | INTRAMUSCULAR | Status: DC
  Administered 2011-11-12 – 2011-11-13 (×3): 3 mL via INTRAVENOUS

## 2011-11-11 MED ORDER — SODIUM CHLORIDE 0.9 % IJ SOLN: 3.0000 mL | INTRAMUSCULAR | Status: DC | PRN

## 2011-11-11 MED ORDER — SIMVASTATIN 40 MG PO TABS
40.0000 mg | ORAL_TABLET | Freq: Every day | ORAL | Status: DC
  Administered 2011-11-11 – 2011-11-12 (×2): 40 mg via ORAL
  Filled 2011-11-11 (×3): qty 1

## 2011-11-11 MED ORDER — CARVEDILOL 25 MG PO TABS
25.0000 mg | ORAL_TABLET | Freq: Every day | ORAL | Status: DC
  Administered 2011-11-11 – 2011-11-13 (×3): 25 mg via ORAL
  Filled 2011-11-11 (×3): qty 1

## 2011-11-11 MED ORDER — VITAMIN B-12 1000 MCG PO TABS
1000.0000 ug | ORAL_TABLET | Freq: Every day | ORAL | Status: DC
  Administered 2011-11-11 – 2011-11-13 (×3): 1000 ug via ORAL
  Filled 2011-11-11 (×3): qty 1

## 2011-11-11 MED ORDER — SODIUM CHLORIDE 0.9 % IV SOLN: 250.0000 mL | INTRAVENOUS | Status: DC | PRN

## 2011-11-11 MED ORDER — TAMSULOSIN HCL 0.4 MG PO CAPS
0.4000 mg | ORAL_CAPSULE | Freq: Every day | ORAL | Status: DC
  Administered 2011-11-11 – 2011-11-13 (×3): 0.4 mg via ORAL
  Filled 2011-11-11 (×3): qty 1

## 2011-11-11 MED ORDER — FUROSEMIDE 10 MG/ML IJ SOLN
40.0000 mg | Freq: Once | INTRAMUSCULAR | Status: AC
  Administered 2011-11-11: 40 mg via INTRAVENOUS

## 2011-11-11 MED ORDER — FUROSEMIDE 20 MG PO TABS
20.0000 mg | ORAL_TABLET | Freq: Every day | ORAL | Status: DC
  Administered 2011-11-11: 20 mg via ORAL
  Filled 2011-11-11: qty 1

## 2011-11-11 NOTE — H&P (Signed)
History and Physical  Patient ID: [name] MRN: [id number], SOB: January 20, 1921 78 y.o. Date of Encounter: 11/11/2011, 4:13 PM  Primary Cardiologist: Dr. Graciela Husbands  Chief Complaint: low hemoglobin (sent by primary doctor's office)  HPI: 78 y/o M with hx of PAF on Coumadin, bioprosthetic AVR, CAD s/p CABG, prior ICM (EF not estimated by prior echo but felt low-normal 08/2009) who presented to [facility] at the advice of his primary care doctor. He saw his PCP Dr. Renne Rice several days ago today with complaints of increased fatigue with DOE. At that visit he was referred to heme/onc (appt pending) and renal. The patient saw Dr. Lowell Rice for kidney insufficiency who apparently did labs and found that the patient's Hgb was low so the patient was instructed to proceed to ER. [name] has has noted general lack of energy over the past 6 weeks as well as dyspnea even with low-grade activity like tying his shoes. He denies any orthopnea, PND, SOB at rest, CP, palpitations, LEE, or abdominal swelling.   In the [facility] ER, Hgb is 7.2, Plts are 65. Last CBC from 02/2011 showed Hgb of 9.7 and platelet count of 73. FOBT is negative & the patient states he had a negative hemoccult last week at Dr. Carolee Rice office as well. No melena, hematemesis, BRBPR, epistaxis, overt hematuria although he does have Hgb in urine here. We are asked to see him for question of CHF. CXR shows borderline cardiomegaly with suspected pulmonary venous HTN without overt edema as well as calcified pleural plaque raising suspicion for prior asbestos. BNP is 3705, but this is in the setting of elevated Cr of 1.89 (prior labs show Cr 1.76 02/2011). Vitals are stable. EKG reads as accelerated junctional with RBBB, 70 bpm but appears V-paced on telemetry. Last remote check was 10/30/11 - no V high-rate episodes, Bi-V pacing 98% with appropriate sensing and capture.    Past Medical History  Diagnosis Date  . Atrial fibrillation     Paroxysmal, on  Coumadin  . CAD (coronary artery disease)     CABG 10/2006  . Aortic stenosis     Bioprosthetic AVR  . Hypertension   . Colon cancer     s/p colon resection  . Prostate cancer   . GERD (gastroesophageal reflux disease)   . Ischemic cardiomyopathy     EF 40% previously s/p BiV St. Jude PPM. Most recent echo 08/2009 - EF could not be estimated but appeared to be low-normal.  Renal insufficiency, although not previously documented, appears to also have been present   2D Echo 08/2009 Study Conclusions 1. Left ventricle: The cavity size was normal. [name] thickness was increased in a pattern of moderate LVH. 2. Aortic valve: There was mild stenosis. Trivial regurgitation. Valve area: 1.13cm^2(VTI). Valve area: 1.14cm^2 (Vmax). 3. Mitral valve: Calcified annulus. Mildly thickened leaflets . Mild regurgitation. 4. Atrial septum: No defect or patent foramen ovale was identified.  Pacemaker remote check 10/30/11 Device function reviewed. Impedance, sensing, auto capture thresholds consistent with previous measurements. Histograms appropriate for patient and level of activity. All other diagnostic data reviewed and is appropriate and  stable for patient. Real time/magnet EGM shows appropriate sensing and capture. 52 % mode switched, + coumadin. No ventricular high rate episodes. BiV pacing 98%. Plan to follow in 3 months remotely, to see in office annually. Next remote 01/29/12.   Surgical History:  Past Surgical History  Procedure Date  . Insert / replace / remove pacemaker  biventricular, St. Jude  . Coronary artery bypass graft   . Aortic valve replacement   . Left eye cataract extraction   . Appendectomy   . Hemicolectomy 2000    right  . Cardiac surgery      Home Meds: Medication Sig  carvedilol (COREG) 25 MG tablet Take 25 mg by mouth daily.  furosemide (LASIX) 20 MG tablet Take 20 mg by mouth daily.   omeprazole (PRILOSEC) 20 MG capsule Take 20 mg by mouth daily.   simvastatin  (ZOCOR) 40 MG tablet Take 40 mg by mouth at bedtime.    Tamsulosin HCl (FLOMAX) 0.4 MG CAPS Take 0.4 mg by mouth daily.    vitamin B-12 (CYANOCOBALAMIN) 1000 MCG tablet Take 1,000 mcg by mouth daily.  warfarin (COUMADIN) 5 MG tablet Take 5 mg by mouth daily.     Allergies: No Known Allergies  History   Social History  . Marital Status: Married   Social History Main Topics  . Smoking status: Former Smoker    Types: Pipe    Quit date: 07/23/1981  . Smokeless tobacco: Not on file  . Alcohol Use: No  . Drug Use: No  . Sexually Active: Not on file    Family History  Problem Relation Age of Onset  . Heart disease Mother     Review of Systems: General: negative for chills, fever, night sweats or weight changes.  Cardiovascular: negative for chest pain, edema, orthopnea, palpitations, paroxysmal nocturnal dyspnea. See above - +DOE Dermatological: negative for rash Respiratory: negative for cough or wheezing Urologic: negative for hematuria although mod blood in UA today (had +hgb in blood 02/2011) Abdominal: negative for nausea, vomiting, diarrhea, bright red blood per rectum, melena, or hematemesis Neurologic: negative for visual changes, syncope. Only very mild dizziness when he stands up from bending over All other systems reviewed and are otherwise negative except as noted above.  Labs:   Lab Results  Component Value Date   WBC 9.2 11/11/2011   HGB 7.2* 11/11/2011   HCT 23.4* 11/11/2011   MCV 97.1 11/11/2011   PLT 65* 11/11/2011    Lab 11/11/11 1130  NA 137  K 4.1  CL 103  CO2 21  BUN 31*  CREATININE 1.89*  CALCIUM 9.6  PROT 8.2  BILITOT 0.7  ALKPHOS 85  ALT 10  AST 18  GLUCOSE 112*    Basename 11/11/11 1140  CKTOTAL --  CKMB --  TROPONINI <0.30    Radiology/Studies:  1. Chest 2 View 11/11/2011  *RADIOLOGY REPORT*    IMPRESSION:  1.  Borderline cardiomegaly with suspected pulmonary venous hypertension. 2.  Calcified pleural plaque suggesting prior asbestos  exposure. 3.  Atherosclerosis.  Original Report Authenticated By: Dellia Cloud, M.D.    EKG: EKG reads as accelerated junctional with RBBB, 70 bpm, appears V-paced on telemetry     Physical Exam: Blood pressure 130/87, pulse 67, temperature 97.6 F (36.4 C), temperature source Oral, resp. rate 16, height 5\' 10"  (1.778 m), weight 217 lb (98.431 kg), SpO2 98.00%. General: Well developed, well nourished, in no acute distress appears more youthful in affect than stated age. Laying comfortably with head raised about 20 degrees in bed. Head: Normocephalic, atraumatic, sclera non-icteric, no xanthomas, nares are without discharge.  Neck: Negative for carotid bruits. JVD not elevated. Lungs: Clear bilaterally to auscultation without wheezes, rales, or rhonchi. Breathing is unlabored. Heart: RRR with S1 S2. No murmurs, rubs, or gallops appreciated. Abdomen: Soft, non-tender, non-distended with normoactive bowel sounds. No  hepatomegaly. No rebound/guarding. No obvious abdominal masses. Msk:  Strength and tone appear normal for age. Extremities: No clubbing or cyanosis.  Tr sockline edema.  Distal pedal pulses are 2+ and equal bilaterally. Neuro: Alert and oriented X 3. Moves all extremities spontaneously. Psych:  Responds to questions appropriately with a normal affect.   ASSESSMENT AND PLAN:   1. Dyspnea in the setting of acute (on possibly chronic) anemia 2. Hx of ICM s/p Bi-V pacemaker, last EF unable to be estimated but low-normal in 2010 (previously 40%) 3. Atrial fibrillation on Coumadin at home - now 98% BiV paced 4. CAD s/p CABG 5. Renal insufficiency  The patient's symptoms of exertional dyspnea sound more consistent with acute anemia in the setting of underlying CAD, rather than than congestive heart failure. He denies any orthopnea, PND, weight changes, or LEE. BNP is mildly elevated but in setting of elevated Cr this can be less helpful. He does not appear volume overloaded on  exam. The treatment at present would be to give blood, paying careful attention to his volume status. Agree with Lasix in between units, but we would not aggressively diurese given elevated BUN/Cr. We will keep the inter-transfusion Lasix on board, but will d/c the scheduled BID IV Lasix and po Lasix that have been ordered. Agree with holding Coumadin until the cause of his anemia is determined (?slow bleed from hematuria given +Hgb in urine 02/2011, vs alternatively an occult leukemia given 2 cell lines down). Recommend consideration of heme consult. Continue BB/statin as med rx for CAD - would hold off ASA as well given anemia. Not sure why his BB is only once a day but if vitals remain stable consider splitting up into Coreg 12.5mg  bid. I doubt ACS at present as he has had no chest pain. We will repeat a 2D echo.  Signed, Dayna Dunn PA-C 11/11/2011, 5:08 PM  I have taken a history, reviewed medications, allergies, PMH, SH, FH, and reviewed ROS and examined the patient.  I agree with the assessment and plan as discussed with D Dunn and patient.  Sueanne Maniaci C. Daleen Squibb, MD, [facility] Pager:  [phone]

## 2011-11-11 NOTE — H&P (Addendum)
PCP:  Merri Brunette, MD  DOA:  11/11/2011 10:24 AM  Chief Complaint:  Sent from PCP office for symptomatic anemia  HPI: 78 y/o male with hx of CAD, s/p CABG, afib, AVR with st jude valve on ch, pacemaker for bradycardia, CHF with EF of 40% as per cardiology note on last visit, CKD , hx of colon ca, BPH, B12 deficiency presented to his PCP office for persistent fatigue and feeling tired with minimal exertion for past 4-5 weeks. A routine blood work done showed hb of 7.2 ( baseline of 9-10 , last checked 6 months back) . Patient has been feeling very fatigued over past 4 -5 weeks and unable to even move from one room to another without being tired and dyspneic. He denies any change in his weight or leg swellings. He denies dark stools or  Hematuria. Denies taking NSAIDs as outpatient. He denies orthopnea, PND, chest pain or palpitations. He informs being compliant to his medications.  Allergies: No Known Allergies  Prior to Admission medications   Medication Sig Start Date End Date Taking? Authorizing Provider  carvedilol (COREG) 25 MG tablet Take 25 mg by mouth daily.   Yes Historical Provider, MD  furosemide (LASIX) 20 MG tablet Take 20 mg by mouth daily.    Yes Historical Provider, MD  omeprazole (PRILOSEC) 20 MG capsule Take 20 mg by mouth daily.    Yes Historical Provider, MD  simvastatin (ZOCOR) 40 MG tablet Take 40 mg by mouth at bedtime.     Yes Historical Provider, MD  Tamsulosin HCl (FLOMAX) 0.4 MG CAPS Take 0.4 mg by mouth daily.     Yes Historical Provider, MD  vitamin B-12 (CYANOCOBALAMIN) 1000 MCG tablet Take 1,000 mcg by mouth daily.   Yes Historical Provider, MD  warfarin (COUMADIN) 5 MG tablet Take 5 mg by mouth daily.    Yes Historical Provider, MD    Past Medical History  Diagnosis Date  . Atrial fibrillation   . CAD (coronary artery disease)   . Aortic stenosis   . Hypertension   . Colon cancer   . Prostate cancer   . GERD (gastroesophageal reflux disease)   .  Cardiac pacemaker in situ     biventricular St. Jude    Past Surgical History  Procedure Date  . Insert / replace / remove pacemaker     biventricular, St. Jude  . Coronary artery bypass graft   . Aortic valve replacement   . Left eye cataract extraction   . Appendectomy   . Hemicolectomy 2000    right  . Cardiac surgery     Social History:  reports that he quit smoking about 30 years ago. His smoking use included Pipe. He does not have any smokeless tobacco history on file. He reports that he does not drink alcohol or use illicit drugs.  Family History  Problem Relation Age of Onset  . Heart disease Mother     Review of Systems:  Constitutional: positive for fatigue, Denies fever, chills, diaphoresis, appetite change   HEENT: Denies photophobia, eye pain, redness, hearing loss, ear pain, congestion, sore throat, rhinorrhea, sneezing, mouth sores, trouble swallowing, neck pain, neck stiffness and tinnitus.   Respiratory: SOB, DOE, cough, chest tightness,  and wheezing.   Cardiovascular: Denies chest pain, palpitations and leg swelling.  Gastrointestinal: Denies nausea, vomiting, abdominal pain, diarrhea, constipation, blood in stool and abdominal distention.  Genitourinary: Denies dysuria, urgency, frequency, hematuria, flank pain and difficulty urinating.  Musculoskeletal: Denies myalgias, back pain,  joint swelling, arthralgias and gait problem.  Skin: Denies pallor, rash and wound.  Neurological: Denies dizziness, seizures, syncope, weakness, light-headedness, numbness and headaches.  Hematological: Denies adenopathy. Easy bruising, personal or family bleeding history  Psychiatric/Behavioral: Denies suicidal ideation, mood changes, confusion, nervousness, sleep disturbance and agitation   Physical Exam:  Filed Vitals:   11/11/11 1350 11/11/11 1351 11/11/11 1430 11/11/11 1530  BP: 142/65 130/47 151/62 130/87  Pulse: 70 83 69 67  Temp:      TempSrc:      Resp:   18 16    Height:      Weight:      SpO2:   99% 98%    Constitutional: Vital signs reviewed.  Patient is a well-developed and well-nourished in no acute distress and cooperative with exam.  HEENT: no pallor, moist oral mucosa, mild JVD Cardiovascular: RRR, S1 normal, S2 normal, no MRG, pulses symmetric and intact bilaterally Pulmonary/Chest: CTAB, no wheezes, rales, or rhonchi Abdominal: Soft. Non-tender, non-distended, bowel sounds are normal, no masses, organomegaly, or guarding present.  GU: no CVA tenderness Musculoskeletal: No joint deformities, erythema, or stiffness, ROM full and no nontender Ext: 2+ pitting edema, distal pulses papable Hematology: no cervical, inginal, or axillary adenopathy.  Neurological: A&O x3, Strenght is normal and symmetric bilaterally, cranial nerve II-XII are grossly intact, no focal motor deficit, sensory intact to light touch bilaterally.  Skin: Warm, dry and intact. No rash, cyanosis, or clubbing.  Psychiatric: Normal mood and affect. speech and behavior is normal. Judgment and thought content normal. Cognition and memory are normal.   Labs on Admission:  Results for orders placed during the hospital encounter of 11/11/11 (from the past 48 hour(s))  CBC     Status: Abnormal   Collection Time   11/11/11 11:30 AM      Component Value Range Comment   WBC 9.2  4.0 - 10.5 (K/uL)    RBC 2.41 (*) 4.22 - 5.81 (MIL/uL)    Hemoglobin 7.2 (*) 13.0 - 17.0 (g/dL)    HCT 16.1 (*) 09.6 - 52.0 (%)    MCV 97.1  78.0 - 100.0 (fL)    MCH 29.9  26.0 - 34.0 (pg)    MCHC 30.8  30.0 - 36.0 (g/dL)    RDW 04.5 (*) 40.9 - 15.5 (%)    Platelets 65 (*) 150 - 400 (K/uL)   PROTIME-INR     Status: Abnormal   Collection Time   11/11/11 11:30 AM      Component Value Range Comment   Prothrombin Time 22.8 (*) 11.6 - 15.2 (seconds)    INR 1.97 (*) 0.00 - 1.49    COMPREHENSIVE METABOLIC PANEL     Status: Abnormal   Collection Time   11/11/11 11:30 AM      Component Value Range Comment    Sodium 137  135 - 145 (mEq/L)    Potassium 4.1  3.5 - 5.1 (mEq/L)    Chloride 103  96 - 112 (mEq/L)    CO2 21  19 - 32 (mEq/L)    Glucose, Bld 112 (*) 70 - 99 (mg/dL)    BUN 31 (*) 6 - 23 (mg/dL)    Creatinine, Ser 8.11 (*) 0.50 - 1.35 (mg/dL)    Calcium 9.6  8.4 - 10.5 (mg/dL)    Total Protein 8.2  6.0 - 8.3 (g/dL)    Albumin 3.6  3.5 - 5.2 (g/dL)    AST 18  0 - 37 (U/L)    ALT 10  0 - 53 (U/L)    Alkaline Phosphatase 85  39 - 117 (U/L)    Total Bilirubin 0.7  0.3 - 1.2 (mg/dL)    GFR calc non Af Amer 30 (*) >90 (mL/min)    GFR calc Af Amer 35 (*) >90 (mL/min)   OCCULT BLOOD, POC DEVICE     Status: Normal   Collection Time   11/11/11 11:36 AM      Component Value Range Comment   Fecal Occult Bld NEGATIVE     PRO B NATRIURETIC PEPTIDE     Status: Abnormal   Collection Time   11/11/11 11:40 AM      Component Value Range Comment   Pro B Natriuretic peptide (BNP) 3705.0 (*) 0 - 450 (pg/mL)   TROPONIN I     Status: Normal   Collection Time   11/11/11 11:40 AM      Component Value Range Comment   Troponin I <0.30  <0.30 (ng/mL)   TYPE AND SCREEN     Status: Normal   Collection Time   11/11/11  1:20 PM      Component Value Range Comment   ABO/RH(D) B NEG      Antibody Screen NEG      Sample Expiration 11/14/2011     URINALYSIS, ROUTINE W REFLEX MICROSCOPIC     Status: Abnormal   Collection Time   11/11/11  1:45 PM      Component Value Range Comment   Color, Urine YELLOW  YELLOW     APPearance CLEAR  CLEAR     Specific Gravity, Urine 1.010  1.005 - 1.030     pH 6.5  5.0 - 8.0     Glucose, UA NEGATIVE  NEGATIVE (mg/dL)    Hgb urine dipstick MODERATE (*) NEGATIVE     Bilirubin Urine NEGATIVE  NEGATIVE     Ketones, ur NEGATIVE  NEGATIVE (mg/dL)    Protein, ur NEGATIVE  NEGATIVE (mg/dL)    Urobilinogen, UA 0.2  0.0 - 1.0 (mg/dL)    Nitrite NEGATIVE  NEGATIVE     Leukocytes, UA NEGATIVE  NEGATIVE    URINE MICROSCOPIC-ADD ON     Status: Normal   Collection Time   11/11/11  1:45 PM       Component Value Range Comment   Squamous Epithelial / LPF RARE  RARE     WBC, UA 0-2  <3 (WBC/hpf)    RBC / HPF 3-6  <3 (RBC/hpf)     Radiological Exams on Admission: RADIOLOGY REPORT*  Clinical Data: Shortness of breath. Anemia.  CHEST - 2 VIEW  Comparison: 11/03/2011  Findings: The pacer device appears unchanged.  The patient is rotated to the right on today's exam, resulting in  reduced diagnostic sensitivity and specificity. Atherosclerotic  calcification of the aortic arch noted. There is evidence of prior  CABG.  Pleural and hemidiaphragmatic calcifications suggest calcified  pleural plaques bilaterally. The borderline cardiomegaly noted.  Prominent upper zone pulmonary vasculature is suspicious for  pulmonary venous hypertension, without overt edema.   IMPRESSION:  1. Borderline cardiomegaly with suspected pulmonary venous  hypertension.  2. Calcified pleural plaque suggesting prior asbestos exposure.  3. Atherosclerosis.   Assessment  78 y/o male with hx of CAD, s/p CABG, afib, AVR with st jude valve on ch, pacemaker for bradycardia, CHF with EF of 40% as per cardiology note on last visit, CKD , hx of colon ca, BPH, B12 deficiency presented to his PCP office for persistent fatigue and feeling  tired with minimal exertion for past 4-5 weeks with findings of symptoms.    Plan  *symptomatic Anemia Patient has significant drop in h&H from baseline  stool for occult blood in ED negative' no gross hematuria on UA Will admit to tele  type and screen and transfuse 2 U prbc  given IV lasix 40 mg in between transfusion Monitor serial H&h Hold coumadin for now Ordered iron panel    Congestive heart failure (CHF) Patient appears mildly congested with pulm venous congestion with elevated pro BNP Will increase IV lasix to 40 mg bid monitor I/O and daily weight Patient follows with Dr Graciela Husbands and have informed [facility] cardiology. Last echo in syustem from 2010. Will hold off  on reepat 2d echo until cardiology evaluation   AVR   will hold coumadin for now  check INR in am   CAD    cont coreg  Hx of pacemaker  stable  Diet: cardiac  DVT prophylaxis: SCD  Full code    Time Spent on Admission: 60 minutes  Stashia Sia 11/11/2011, 3:51 PM

## 2011-11-11 NOTE — ED Provider Notes (Signed)
History     CSN: [id number]  Arrival date & time 11/11/11  1020   First MD Initiated Contact with Patient 11/11/11 1035     Chief Complaint  Patient presents with  . Sent here by md --low hgb    HPI 78yo man with PMH significant for colon ca (s/p resection & re-anastomosis & blood transfusion in 2000) as well as afib on coumadin therapy (last INR 2.5 per pt) p/w from PCP's office (Dr. [name]) 2/2 low Hb.  He does not know what Hb is, but notes that it was 7 in the recent past.  Over the last few weeks, he has been feeling increased fatigue with DOE.  Denies CP/palpitations/SOB at rest/vision changes/blood in stools or urine.  No changes in mental status per patient and patient's wife. No recent traumatic cause of blood loss.  No recent illness.  Denies any pain.  Denies LE edema. Reports last colonoscopy was 4-5 years ago by Dr. Virginia Rochester and was told no need for future colonoscopy.    Past Medical History  Diagnosis Date  . Atrial fibrillation   . CAD (coronary artery disease)   . Aortic stenosis   . Hypertension   . Colon cancer   . Prostate cancer   . GERD (gastroesophageal reflux disease)   . Cardiac pacemaker in situ     biventricular St. Jude    Past Surgical History  Procedure Date  . Insert / replace / remove pacemaker     biventricular, St. Jude  . Coronary artery bypass graft   . Aortic valve replacement   . Left eye cataract extraction   . Appendectomy   . Hemicolectomy 2000    right  . Cardiac surgery     Family History  Problem Relation Age of Onset  . Heart disease Mother     History  Substance Use Topics  . Smoking status: Former Smoker    Types: Pipe    Quit date: 07/23/1981  . Smokeless tobacco: Not on file  . Alcohol Use: No    Review of Systems  Constitutional: Positive for activity change and fatigue. Negative for fever, chills and diaphoresis.  HENT: Negative for nosebleeds, congestion, sneezing and trouble swallowing.   Eyes: Negative for visual  disturbance.  Respiratory: Positive for shortness of breath. Negative for chest tightness and wheezing.        Occasional phlegm producing cough, chronic; SOB on exertion, not at rest  Cardiovascular: Negative for chest pain, palpitations and leg swelling.  Gastrointestinal: Positive for constipation. Negative for nausea, vomiting, abdominal pain, diarrhea, blood in stool, abdominal distention and rectal pain.  Musculoskeletal: Negative for myalgias, back pain, arthralgias and gait problem.  Skin: Negative for rash.  Neurological: Positive for dizziness. Negative for syncope, facial asymmetry, speech difficulty and headaches.  Psychiatric/Behavioral: Negative for hallucinations and confusion.    Allergies  Review of patient's allergies indicates no known allergies.  Home Medications   Current Outpatient Rx  Name Route Sig Dispense Refill  . CARVEDILOL 25 MG PO TABS Oral Take 25 mg by mouth daily.    . FUROSEMIDE 20 MG PO TABS Oral Take 20 mg by mouth daily.     Marland Kitchen OMEPRAZOLE 20 MG PO CPDR Oral Take 20 mg by mouth daily.     Marland Kitchen SIMVASTATIN 40 MG PO TABS Oral Take 40 mg by mouth at bedtime.      . TAMSULOSIN HCL 0.4 MG PO CAPS Oral Take 0.4 mg by mouth daily.      Marland Kitchen  VITAMIN B-12 1000 MCG PO TABS Oral Take 1,000 mcg by mouth daily.    . WARFARIN SODIUM 5 MG PO TABS Oral Take 5 mg by mouth daily.       BP 146/53  Pulse 70  Temp(Src) 97.6 F (36.4 C) (Oral)  Resp 18  Ht 5\' 10"  (1.778 m)  Wt 217 lb (98.431 kg)  BMI 31.14 kg/m2  SpO2 97%  Physical Exam  Constitutional: He is oriented to person, place, and time. He appears well-developed and well-nourished. No distress.  HENT:  Head: Normocephalic and atraumatic.  Eyes: EOM are normal. Pupils are equal, round, and reactive to light. No scleral icterus.       No conjunctival pallor  Neck: Normal range of motion. Neck supple. No JVD present. No thyromegaly present.  Cardiovascular: Normal rate, regular rhythm, normal heart sounds and  intact distal pulses.  Exam reveals no gallop and no friction rub.   No murmur heard. Pulmonary/Chest: Effort normal and breath sounds normal. No respiratory distress. He has no wheezes. He has no rales. He exhibits no tenderness.  Abdominal: Soft. Bowel sounds are normal. He exhibits no distension and no mass. There is no tenderness. There is no rebound and no guarding.  Genitourinary: Rectum normal and prostate normal. Guaiac negative stool.       No hemorrhoids   Musculoskeletal: Normal range of motion.  Neurological: He is alert and oriented to person, place, and time. No cranial nerve deficit.  Skin: Skin is warm and dry. No rash noted. He is not diaphoretic.  Psychiatric: He has a normal mood and affect. His behavior is normal.    ED Course  Procedures (including critical care time)  Labs Reviewed  CBC - Abnormal; Notable for the following:    RBC 2.41 (*)    Hemoglobin 7.2 (*)    HCT 23.4 (*)    RDW 19.8 (*)    Platelets 65 (*)    All other components within normal limits  PROTIME-INR - Abnormal; Notable for the following:    Prothrombin Time 22.8 (*)    INR 1.97 (*)    All other components within normal limits  COMPREHENSIVE METABOLIC PANEL - Abnormal; Notable for the following:    Glucose, Bld 112 (*)    BUN 31 (*)    Creatinine, Ser 1.89 (*)    GFR calc non Af Amer 30 (*)    GFR calc Af Amer 35 (*)    All other components within normal limits  PRO B NATRIURETIC PEPTIDE - Abnormal; Notable for the following:    Pro B Natriuretic peptide (BNP) 3705.0 (*)    All other components within normal limits  URINALYSIS, ROUTINE W REFLEX MICROSCOPIC - Abnormal; Notable for the following:    Hgb urine dipstick MODERATE (*)    All other components within normal limits  OCCULT BLOOD, POC DEVICE  TROPONIN I  TYPE AND SCREEN  URINE MICROSCOPIC-ADD ON  TYPE AND SCREEN   Dg Chest 2 View  11/11/2011  *RADIOLOGY REPORT*  Clinical Data: Shortness of breath.  Anemia.  CHEST - 2 VIEW   Comparison: 11/03/2011  Findings: The pacer device appears unchanged.  The patient is rotated to the right on today's exam, resulting in reduced diagnostic sensitivity and specificity.   Atherosclerotic calcification of the aortic arch noted.  There is evidence of prior CABG.  Pleural and hemidiaphragmatic calcifications suggest calcified pleural plaques bilaterally.  The borderline cardiomegaly noted. Prominent upper zone pulmonary vasculature is suspicious for pulmonary  venous hypertension, without overt edema.  IMPRESSION:  1.  Borderline cardiomegaly with suspected pulmonary venous hypertension. 2.  Calcified pleural plaque suggesting prior asbestos exposure. 3.  Atherosclerosis.  Original Report Authenticated By: Dellia Cloud, M.D.   1. Anemia   2. Congestive heart failure   3. Hematuria     MDM  78yo man with PMH significant for colon ca (s/p resection & re-anastomosis & blood transfusion in 2000) as well as afib on coumadin therapy (last INR 2.5 per pt) p/w from PCP's office (Dr. [name]) 2/2 low Hb.  Given symptomatic anemia & hematuria in setting of elevated pBNP and significant cardiac history, patient to be admitted for close fluid status monitoring during transfusion, as well as work up for hematuria.        Vernice Jefferson, MD 11/11/11 [phone]

## 2011-11-11 NOTE — ED Notes (Signed)
Dr. [name] sent pt here for low hgb.  Pt is alert and oriented.  No bleeding that he knows of.  He said that they referred him to a kidney doctor

## 2011-11-11 NOTE — ED Notes (Signed)
[id number] 

## 2011-11-12 DIAGNOSIS — I5032 Chronic diastolic (congestive) heart failure: Secondary | ICD-10-CM

## 2011-11-12 DIAGNOSIS — I509 Heart failure, unspecified: Secondary | ICD-10-CM

## 2011-11-12 DIAGNOSIS — D649 Anemia, unspecified: Principal | ICD-10-CM

## 2011-11-12 DIAGNOSIS — I369 Nonrheumatic tricuspid valve disorder, unspecified: Secondary | ICD-10-CM

## 2011-11-12 LAB — RETICULOCYTES: RBC.: 3.04 MIL/uL — ABNORMAL LOW (ref 4.22–5.81)

## 2011-11-12 LAB — TYPE AND SCREEN: Unit division: 0

## 2011-11-12 LAB — PROTIME-INR
INR: 1.66 — ABNORMAL HIGH (ref 0.00–1.49)
Prothrombin Time: 19.9 seconds — ABNORMAL HIGH (ref 11.6–15.2)

## 2011-11-12 LAB — CBC
MCH: 29.6 pg (ref 26.0–34.0)
MCHC: 32.1 g/dL (ref 30.0–36.0)
MCV: 92.3 fL (ref 78.0–100.0)
Platelets: 47 10*3/uL — ABNORMAL LOW (ref 150–400)
RDW: 20.7 % — ABNORMAL HIGH (ref 11.5–15.5)

## 2011-11-12 LAB — BASIC METABOLIC PANEL
CO2: 24 mEq/L (ref 19–32)
Calcium: 9.7 mg/dL (ref 8.4–10.5)
Creatinine, Ser: 2.12 mg/dL — ABNORMAL HIGH (ref 0.50–1.35)
GFR calc Af Amer: 30 mL/min — ABNORMAL LOW (ref >90)
GFR calc non Af Amer: 26 mL/min — ABNORMAL LOW (ref >90)
Sodium: 138 mEq/L (ref 135–145)

## 2011-11-12 LAB — IRON AND TIBC
Iron: 63 ug/dL (ref 42–135)
Saturation Ratios: 15 % — ABNORMAL LOW (ref 20–55)
TIBC: 411 ug/dL (ref 215–435)
UIBC: 348 ug/dL (ref 125–400)

## 2011-11-12 MED ORDER — SODIUM CHLORIDE 0.9 % IV SOLN
1020.0000 mg | Freq: Once | INTRAVENOUS | Status: AC
  Administered 2011-11-12: 1020 mg via INTRAVENOUS
  Filled 2011-11-12 (×2): qty 34

## 2011-11-12 MED ORDER — ACETAMINOPHEN 325 MG PO TABS
650.0000 mg | ORAL_TABLET | Freq: Four times a day (QID) | ORAL | Status: DC | PRN
  Administered 2011-11-12: 650 mg via ORAL
  Filled 2011-11-12: qty 2

## 2011-11-12 MED ORDER — WARFARIN SODIUM 5 MG PO TABS
5.0000 mg | ORAL_TABLET | Freq: Once | ORAL | Status: AC
  Administered 2011-11-12: 5 mg via ORAL
  Filled 2011-11-12: qty 1

## 2011-11-12 NOTE — Consult Note (Signed)
Please refer to note yesterday 11/11/11 by Cardiology erroneously listed as H&P - the patient was ultimately admitted by hospitalist service and this previous note serves as his Consult Note.  Dayna Dunn PA-C  I have taken a history, reviewed medications, allergies, PMH, SH, FH, and reviewed ROS and examined the patient.  I agree with the assessment and plan as discussed with D Dunn and Mr Mckamie.  Carolin Quang C. Daleen Squibb, MD, [facility] Pager:  [phone]

## 2011-11-12 NOTE — ED Provider Notes (Signed)
I saw and evaluated the patient, reviewed the resident's note and I agree with the findings and plan.   Date: 11/12/2011  Rate: 70  Rhythm:?Accelarated junctional rhythm. No p waves noted. Likely V paced   +Anemia of unk cause. He does have hematuria today and on recent UAs noted in EPIC. No acute distress at this time. Admitted for further w/u and evaluation, possible transfusion given exertional dyspnea. Does not appear to have active CHF exacerbation at this time.  Forbes Cellar, MD 11/12/11 [phone]

## 2011-11-12 NOTE — Progress Notes (Addendum)
Subjective: Chart reviewed. Patient indicates that he feels better overall. He feels stronger. He denies dyspnea or chest pain.  History of colon cancer surgery approximately 15 years ago. Last colonoscopy by Dr. Sabino Gasser approximately 2-3 years ago said to be normal. Patient denies black stools, blood in stools or hematuria. He has not had an EGD done. He denies any epigastric abdominal pain. He has an appointment to see Dr. Arlan Organ on February 13.  Objective: Blood pressure 125/84, pulse 70, temperature 98.3 F (36.8 C), temperature source Oral, resp. rate 18, height 5\' 10"  (1.778 m), weight 92.534 kg (204 lb), SpO2 98.00%.  Intake/Output Summary (Last 24 hours) at 11/12/11 1403 Last data filed at 11/12/11 1313  Gross per 24 hour  Intake 1764.25 ml  Output   2951 ml  Net -1186.75 ml    General Exam: Comfortable. Sitting on a chair. Respiratory System: Clear. No increased work of breathing. Cardiovascular System: First and second heart sounds heard. Regular rate and rhythm. No JVD/murmurs or pedal edema. Telemetry shows V. paced rhythm. Gastrointestinal System: Abdomen is non distended, soft and normal bowel sounds heard. Central Nervous System: Alert and oriented. No focal neurological deficits.  Lab Results: Basic Metabolic Panel:  Basename 11/12/11 0555 11/11/11 1130  NA 138 137  K 3.7 4.1  CL 101 103  CO2 24 21  GLUCOSE 103* 112*  BUN 33* 31*  CREATININE 2.12* 1.89*  CALCIUM 9.7 9.6  MG -- --  PHOS -- --   Liver Function Tests:  Basename 11/11/11 1130  AST 18  ALT 10  ALKPHOS 85  BILITOT 0.7  PROT 8.2  ALBUMIN 3.6   No results found for this basename: LIPASE:2,AMYLASE:2 in the last 72 hours No results found for this basename: AMMONIA:2 in the last 72 hours CBC:  Basename 11/12/11 0555 11/11/11 1130  WBC 6.2 9.2  NEUTROABS -- --  HGB 8.5* 7.2*  HCT 26.5* 23.4*  MCV 92.3 97.1  PLT 47* 65*   Cardiac Enzymes:  Basename 11/11/11 1140  CKTOTAL --    CKMB --  CKMBINDEX --  TROPONINI <0.30   BNP:  Basename 11/11/11 1140  PROBNP 3705.0*   D-Dimer: No results found for this basename: DDIMER:2 in the last 72 hours CBG: No results found for this basename: GLUCAP:6 in the last 72 hours Hemoglobin A1C: No results found for this basename: HGBA1C in the last 72 hours Fasting Lipid Panel: No results found for this basename: CHOL,HDL,LDLCALC,TRIG,CHOLHDL,LDLDIRECT in the last 72 hours Thyroid Function Tests: No results found for this basename: TSH,T4TOTAL,FREET4,T3FREE,THYROIDAB in the last 72 hours Anemia Panel:  Basename 11/11/11 1559  VITAMINB12 --  FOLATE --  FERRITIN 35  TIBC 411  IRON 63  RETICCTPCT --   Coagulation:  Basename 11/12/11 0555 11/11/11 1130  LABPROT 19.9* 22.8*  INR 1.66* 1.97*   Urine Drug Screen: Drugs of Abuse  No results found for this basename: labopia,  cocainscrnur,  labbenz,  amphetmu,  thcu,  labbarb    Alcohol Level: No results found for this basename: ETH:2 in the last 72 hours Urinalysis:  Basename 11/11/11 1345  COLORURINE YELLOW  LABSPEC 1.010  PHURINE 6.5  GLUCOSEU NEGATIVE  HGBUR MODERATE*  BILIRUBINUR NEGATIVE  KETONESUR NEGATIVE  PROTEINUR NEGATIVE  UROBILINOGEN 0.2  NITRITE NEGATIVE  LEUKOCYTESUR NEGATIVE    Micro Results: No results found for this or any previous visit (from the past 240 hour(s)).  Studies/Results: Dg Chest 2 View  11/11/2011  *RADIOLOGY REPORT*  Clinical Data: Shortness of  breath.  Anemia.  CHEST - 2 VIEW  Comparison: 11/03/2011  Findings: The pacer device appears unchanged.  The patient is rotated to the right on today's exam, resulting in reduced diagnostic sensitivity and specificity.   Atherosclerotic calcification of the aortic arch noted.  There is evidence of prior CABG.  Pleural and hemidiaphragmatic calcifications suggest calcified pleural plaques bilaterally.  The borderline cardiomegaly noted. Prominent upper zone pulmonary vasculature is  suspicious for pulmonary venous hypertension, without overt edema.  IMPRESSION:  1.  Borderline cardiomegaly with suspected pulmonary venous hypertension. 2.  Calcified pleural plaque suggesting prior asbestos exposure. 3.  Atherosclerosis.  Original Report Authenticated By: Dellia Cloud, M.D.    Medications: Scheduled Meds:    . carvedilol  25 mg Oral Daily  . furosemide  40 mg Intravenous Once  . pantoprazole  40 mg Oral Q1200  . simvastatin  40 mg Oral QHS  . sodium chloride  3 mL Intravenous Q12H  . Tamsulosin HCl  0.4 mg Oral Daily  . vitamin B-12  1,000 mcg Oral Daily  . DISCONTD: furosemide  40 mg Intravenous BID  . DISCONTD: furosemide  20 mg Oral Daily   Continuous Infusions:  PRN Meds:.sodium chloride, sodium chloride  Assessment/Plan: 1. Symptomatic anemia: Patient has chronic anemia with hemoglobins in 2010 in the 10 g/dL range. He now presented with hemoglobin of 7.2. Unclear if this was a slow decline which it seems to be versus rapid drop. There are no clinical symptoms suggesting bleeding. Unfortunately no anemia panel was sent prior to blood transfusion. Patient is status post 2 units of packed red blood cells last night. Would have expected a higher hemoglobin this morning but may be still equilibrating. Will followup CBC tomorrow. His anemia may be multifactorial secondary to chronic kidney disease, chronic disease. Other possibilities are slow GI bleed versus hemolysis. Will attempt to discuss with Dr. Myna Hidalgo. 2. Chronic thrombocytopenia: Along with anemia may have to hematology call conditions such as MDS. Hematology consultation as an outpatient. Stable. 3. Chronic kidney disease stage III: Creatinine is slightly higher probably from diuresis. Monitor BMP tomorrow. 4. Atrial fibrillation, currently in V. paced rhythm. Resume Coumadin. 5. History of ischemic cardiomyopathy status post biventricular pacemaker 6. History of coronary artery disease status post  CABG 7. History of bioprosthetic aortic valve replacement. 8. ? History of chronic systolic congestive heart failure: Clinically compensated. Lasix on hold secondary to elevated creatinine.  Disposition: If his hemoglobin continues to be stable, possible discharge home tomorrow and to keep up hematology appointment on February 13 for further evaluation. May consider outpatient gastroenterology consultation.  Discuss patient's care at length with his spouse and daughter who were at the bedside.  Addendum:  Discussed with Dr. Myna Hidalgo. Advised sending off erythropoietin levels, reticulocyte count and giving a dose of IV Feraheme. Unfortunately peripheral smear or anemia panel were not sent prior to transfusion. He will see the patient tomorrow morning.       Gisell Buehrle 11/12/2011, 2:03 PM

## 2011-11-12 NOTE — Progress Notes (Signed)
Patient ID: [name], male   DOB: 23-Dec-1920, 78 y.o.   MRN: [id number] Subjective:  No chest pain or sob. Objective:  Vital Signs in the last 24 hours: Temp:  [97.8 F (36.6 C)-98.6 F (37 C)] 98.3 F (36.8 C) (02/06 1159) Pulse Rate:  [67-83] 70  (02/06 1159) Resp:  [16-20] 18  (02/06 1159) BP: (109-162)/(46-87) 125/84 mmHg (02/06 1159) SpO2:  [94 %-99 %] 98 % (02/06 1159) Weight:  [92.534 kg (204 lb)-93.2 kg (205 lb 7.5 oz)] 92.534 kg (204 lb) (02/06 0540)  Intake/Output from previous day: 02/05 0701 - 02/06 0700 In: 1281.3 [P.O.:600; Blood:681.3] Out: 2750 [Urine:2750] Intake/Output from this shift: Total I/O In: 243 [P.O.:240; I.V.:3] Out: -   Physical Exam: Well appearing elderly, NAD HEENT: Unremarkable Neck:  No JVD, no thyromegally Lungs:  Clear with no wheezes, rales, or rhonchi HEART:  Regular rate rhythm, no murmurs, no rubs, no clicks Abd:  Flat, positive bowel sounds, no organomegally, no rebound, no guarding Ext:  2 plus pulses, no edema, no cyanosis, no clubbing Skin:  No rashes no nodules Neuro:  CN II through XII intact, motor grossly intact  Lab Results:  Basename 11/12/11 0555 11/11/11 1130  WBC 6.2 9.2  HGB 8.5* 7.2*  PLT 47* 65*    Basename 11/12/11 0555 11/11/11 1130  NA 138 137  K 3.7 4.1  CL 101 103  CO2 24 21  GLUCOSE 103* 112*  BUN 33* 31*  CREATININE 2.12* 1.89*    Basename 11/11/11 1140  TROPONINI <0.30   Hepatic Function Panel  Basename 11/11/11 1130  PROT 8.2  ALBUMIN 3.6  AST 18  ALT 10  ALKPHOS 85  BILITOT 0.7  BILIDIR --  IBILI --   No results found for this basename: CHOL in the last 72 hours No results found for this basename: PROTIME in the last 72 hours  Imaging: Dg Chest 2 View  11/11/2011  *RADIOLOGY REPORT*  Clinical Data: Shortness of breath.  Anemia.  CHEST - 2 VIEW  Comparison: 11/03/2011  Findings: The pacer device appears unchanged.  The patient is rotated to the right on today's exam,  resulting in reduced diagnostic sensitivity and specificity.   Atherosclerotic calcification of the aortic arch noted.  There is evidence of prior CABG.  Pleural and hemidiaphragmatic calcifications suggest calcified pleural plaques bilaterally.  The borderline cardiomegaly noted. Prominent upper zone pulmonary vasculature is suspicious for pulmonary venous hypertension, without overt edema.  IMPRESSION:  1.  Borderline cardiomegaly with suspected pulmonary venous hypertension. 2.  Calcified pleural plaque suggesting prior asbestos exposure. 3.  Atherosclerosis.  Original Report Authenticated By: Dellia Cloud, M.D.    Cardiac Studies:  Assessment/Plan:  1. Anemia - s/p transfusion. Etiology of anemia is unclear. 2. Acute/chronic systolic CHF - he appears back to baseline. From cardiology perspective, he could be discharged. Would discharge on 40 mg of lasix daily.  LOS: 1 day    Lewayne Bunting 11/12/2011, 12:47 PM

## 2011-11-12 NOTE — Progress Notes (Signed)
Utilization review completed. [name] 11/12/2011

## 2011-11-12 NOTE — Progress Notes (Signed)
*  PRELIMINARY RESULTS* Echocardiogram 2D Echocardiogram has been performed.  Glean Salen [facility] 11/12/2011, 10:24 AM

## 2011-11-12 NOTE — Progress Notes (Signed)
ANTICOAGULATION CONSULT NOTE - Initial Consult  Pharmacy Consult for Coumadin Indication: atrial fibrillation  No Known Allergies  Patient Measurements: Height: 5\' 10"  (177.8 cm) Weight: 204 lb (92.534 kg) IBW/kg (Calculated) : 73   Vital Signs: Temp: 98.3 F (36.8 C) (02/06 1159) Temp src: Oral (02/06 1159) BP: 125/84 mmHg (02/06 1159) Pulse Rate: 70  (02/06 1159)  Labs:  Basename 11/12/11 0555 11/11/11 1140 11/11/11 1130  HGB 8.5* -- 7.2*  HCT 26.5* -- 23.4*  PLT 47* -- 65*  APTT -- -- --  LABPROT 19.9* -- 22.8*  INR 1.66* -- 1.97*  HEPARINUNFRC -- -- --  CREATININE 2.12* -- 1.89*  CKTOTAL -- -- --  CKMB -- -- --  TROPONINI -- <0.30 --   Estimated Creatinine Clearance: 27.5 ml/min (by C-G formula based on Cr of 2.12).  Medical History: Past Medical History  Diagnosis Date  . Atrial fibrillation     Paroxysmal, on Coumadin  . CAD (coronary artery disease)     CABGx6 10/2006  . Aortic stenosis     23-mm St. Jude Biocor porcine bioprosthetic at time of CABG 10/2006  . Hypertension   . Colon cancer     s/p colon resection  . Prostate cancer   . GERD (gastroesophageal reflux disease)   . Ischemic cardiomyopathy     EF 40% previously s/p BiV St. Jude PPM. Most recent echo 08/2009 - EF could not be estimated but appeared to be low-normal.  . Shortness of breath   . Anemia   . Blood transfusion     no hx of reaction to transfusions"    Medications:  Scheduled:    . carvedilol  25 mg Oral Daily  . furosemide  40 mg Intravenous Once  . pantoprazole  40 mg Oral Q1200  . simvastatin  40 mg Oral QHS  . sodium chloride  3 mL Intravenous Q12H  . Tamsulosin HCl  0.4 mg Oral Daily  . vitamin B-12  1,000 mcg Oral Daily  . DISCONTD: furosemide  40 mg Intravenous BID  . DISCONTD: furosemide  20 mg Oral Daily    Assessment: 89 YOM on Coumadin PTA for afib, on hold since admission d/t low hgb, s/p 2 units of PRBC yesterday. FOB neg, no clinical symptoms suggesting  bleeding, plt 47, but has been chronically low. INR subtherapeutic and trending down since coumadin has been on hold. Plan for resume coumadin.  Home dose: 5mg  daily per [facility]  Goal of Therapy:  INR 2-3   Plan:  1. Start with coumadin 5mg  po x 1 2. F/u daily INR   [name] 11/12/2011,2:24 PM

## 2011-11-13 LAB — CBC
HCT: 28 % — ABNORMAL LOW (ref 39.0–52.0)
Hemoglobin: 9 g/dL — ABNORMAL LOW (ref 13.0–17.0)
MCH: 29.7 pg (ref 26.0–34.0)
MCHC: 32.1 g/dL (ref 30.0–36.0)
RBC: 3.03 MIL/uL — ABNORMAL LOW (ref 4.22–5.81)

## 2011-11-13 LAB — BASIC METABOLIC PANEL
BUN: 35 mg/dL — ABNORMAL HIGH (ref 6–23)
CO2: 25 mEq/L (ref 19–32)
Calcium: 9.7 mg/dL (ref 8.4–10.5)
Glucose, Bld: 102 mg/dL — ABNORMAL HIGH (ref 70–99)
Sodium: 138 mEq/L (ref 135–145)

## 2011-11-13 LAB — PROTIME-INR: INR: 1.53 — ABNORMAL HIGH (ref 0.00–1.49)

## 2011-11-13 MED ORDER — WARFARIN SODIUM 7.5 MG PO TABS
7.5000 mg | ORAL_TABLET | Freq: Once | ORAL | Status: AC
  Administered 2011-11-13: 7.5 mg via ORAL
  Filled 2011-11-13: qty 1

## 2011-11-13 NOTE — Progress Notes (Signed)
11/13/2011 [facility], Bosie Clos [facility] Case Management Note [phone]    CARE MANAGEMENT NOTE 11/13/2011  Patient:  Buehl,Rayvion D   Account Number:  [id number]  Date Initiated:  11/13/2011  Documentation initiated by:  Fransico Michael  Subjective/Objective Assessment:   admitted from PCP office on 11/11/11 with low hemoglobin     Action/Plan:   Prior to admission, patient lived at home with spouse. Independent with ADLs   Anticipated DC Date:  11/16/2011   Anticipated DC Plan:  HOME/SELF CARE      DC Planning Services  CM consult      Choice offered to / List presented to:             Status of service:  In process, will continue to follow Medicare Important Message given?   (If response is "NO", the following Medicare IM given date fields will be blank) Date Medicare IM given:   Date Additional Medicare IM given:    Discharge Disposition:    Per UR Regulation:  Reviewed for med. necessity/level of care/duration of stay  Comments:  11/12/11- 1053-J.Tashaun Obey,RN,BSN  [phone]      78yo male patient sent for admission from PCP office for low hemoglobin. Prior to admission, patient lived at home with spouse and was independent with ADLs. In to see patient. Reports using a cane for ambulation. Denies any home or dishcarge needs at present. CM will continue to monitor.

## 2011-11-13 NOTE — Progress Notes (Signed)
ANTICOAGULATION CONSULT NOTE - Follow Up Consult  Pharmacy Consult for Coumadin  Indication: atrial fibrillation   No Known Allergies  Patient Measurements: Height: 5\' 10"  (177.8 cm) Weight: 206 lb 14.4 oz (93.849 kg) (with pajamas on/scale c) IBW/kg (Calculated) : 73   Vital Signs: Temp: 97.7 F (36.5 C) (02/07 0522) Temp src: Oral (02/07 0522) BP: 123/46 mmHg (02/07 0522) Pulse Rate: 69  (02/07 0522)  Labs:  Basename 11/13/11 8469 11/12/11 0555 11/11/11 1140 11/11/11 1130  HGB 9.0* 8.5* -- --  HCT 28.0* 26.5* -- 23.4*  PLT 46* 47* -- 65*  APTT -- -- -- --  LABPROT 18.7* 19.9* -- 22.8*  INR 1.53* 1.66* -- 1.97*  HEPARINUNFRC -- -- -- --  CREATININE 2.14* 2.12* -- 1.89*  CKTOTAL -- -- -- --  CKMB -- -- -- --  TROPONINI -- -- <0.30 --   Estimated Creatinine Clearance: 27.4 ml/min (by C-G formula based on Cr of 2.14).   Medications:  Scheduled:    . carvedilol  25 mg Oral Daily  . ferumoxytol  1,020 mg Intravenous Once  . pantoprazole  40 mg Oral Q1200  . simvastatin  40 mg Oral QHS  . sodium chloride  3 mL Intravenous Q12H  . Tamsulosin HCl  0.4 mg Oral Daily  . vitamin B-12  1,000 mcg Oral Daily  . warfarin  5 mg Oral ONCE-1800    Assessment: 89 YOM on Coumadin PTA for afib, coumadin was restarted yesterday (held on 2/5), INR subtherapeutic Patient with anemia and chronic thrombocytopenia, s/p 2 units of PRBC. hgb 9, plt 46 but stable.   Home dose: 5mg  daily per [facility]  Goal of Therapy:  INR 2-3   Plan:  1. Coumadin 7.5mg  po x 1 2. F/u cbc and INR in am  [name] 11/13/2011,11:12 AM

## 2011-11-13 NOTE — Progress Notes (Signed)
   CARE MANAGEMENT NOTE 11/13/2011  Patient:  Shugrue,Desi D   Account Number:  [id number]  Date Initiated:  11/13/2011  Documentation initiated by:  Fransico Michael  Subjective/Objective Assessment:   admitted from PCP office on 11/11/11 with low hemoglobin     Action/Plan:   Prior to admission, patient lived at home with spouse. Independent with ADLs   Anticipated DC Date:  11/13/2011   Anticipated DC Plan:  HOME/SELF CARE      DC Planning Services  CM consult      Choice offered to / List presented to:             Status of service:  Completed, signed off Medicare Important Message given?   (If response is "NO", the following Medicare IM given date fields will be blank) Date Medicare IM given:   Date Additional Medicare IM given:    Discharge Disposition:  HOME/SELF CARE  Per UR Regulation:  Reviewed for med. necessity/level of care/duration of stay  Comments:  11/12/11- 1053-J.Minnich,RN,BSN  [phone]      78yo male patient sent for admission from PCP office for low hemoglobin. Prior to admission, patient lived at home with spouse and was independent with ADLs. In to see patient. Reports using a cane for ambulation. Denies any home or dishcarge needs at present. CM will continue to monitor.

## 2011-11-13 NOTE — Progress Notes (Signed)
Patient IV and tele has been discontinued, patient and wife verbalizes understanding of discharge instructions_____________________________________________________________________________________D. Manson Passey RN

## 2011-11-13 NOTE — Progress Notes (Signed)
  Patient Name: [name]      SUBJECTIVE: feeling better post transufusion  Past Medical History  Diagnosis Date  . Atrial fibrillation     Paroxysmal, on Coumadin  . CAD (coronary artery disease)     CABGx6 10/2006  . Aortic stenosis     23-mm St. Jude Biocor porcine bioprosthetic at time of CABG 10/2006  . Hypertension   . Colon cancer     s/p colon resection  . Prostate cancer   . GERD (gastroesophageal reflux disease)   . Ischemic cardiomyopathy     EF 40% previously s/p BiV St. Jude PPM. Most recent echo 08/2009 - EF could not be estimated but appeared to be low-normal.  . Shortness of breath   . Anemia   . Blood transfusion     no hx of reaction to transfusions"    PHYSICAL EXAM Filed Vitals:   11/12/11 1159 11/12/11 1700 11/12/11 2039 11/13/11 0522  BP: 125/84 141/66 137/57 123/46  Pulse: 70 70 70 69  Temp: 98.3 F (36.8 C) 97.8 F (36.6 C) 98 F (36.7 C) 97.7 F (36.5 C)  TempSrc: Oral Oral Oral Oral  Resp: 18 18 19 20   Height:      Weight:    206 lb 14.4 oz (93.849 kg)  SpO2: 98% 98% 95% 94%    Well developed and nourished in no acute distress HENT normal Clear Regular rate and rhythm, no murmurs or gallops Abd-soft  No Clubbing cyanosis edema Skin-warm and dry A & Oriented  Grossly normal sensory and motor function      Intake/Output Summary (Last 24 hours) at 11/13/11 1055 Last data filed at 11/13/11 0855  Gross per 24 hour  Intake   1460 ml  Output    901 ml  Net    559 ml    LABS: Basic Metabolic Panel:  Lab 11/13/11 9604 11/12/11 0555 11/11/11 1130  NA 138 138 137  K 4.0 3.7 4.1  CL 102 101 103  CO2 25 24 21   GLUCOSE 102* 103* 112*  BUN 35* 33* 31*  CREATININE 2.14* 2.12* 1.89*  CALCIUM 9.7 9.7 --  MG -- -- --  PHOS -- -- --   Cardiac Enzymes:  Basename 11/11/11 1140  CKTOTAL --  CKMB --  CKMBINDEX --  TROPONINI <0.30   CBC:  Lab 11/13/11 0623 11/12/11 0555 11/11/11 1130  WBC 6.0 6.2 9.2  NEUTROABS -- -- --    HGB 9.0* 8.5* 7.2*  HCT 28.0* 26.5* 23.4*  MCV 92.4 92.3 97.1  PLT 46* 47* 65*   PROTIME:  Basename 11/13/11 0623 11/12/11 0555 11/11/11 1130  LABPROT 18.7* 19.9* 22.8*  INR 1.53* 1.66* 1.97*   Liver Function Tests:  Basename 11/11/11 1130  AST 18  ALT 10  ALKPHOS 85  BILITOT 0.7  PROT 8.2  ALBUMIN 3.6     ASSESSMENT AND PLAN: Stable post transuifion  followup with cardiology as precviously scheduled  Will s/o    Signed, Sherryl Manges MD  11/13/2011

## 2011-11-13 NOTE — Plan of Care (Signed)
Problem: Phase III Progression Outcomes Goal: Voiding independently Outcome: Completed/Met Date Met:  11/13/11 Patient uses urinal

## 2011-11-13 NOTE — Progress Notes (Signed)
This is only a social visit.  I have not seen [name] for several years.  I suspect that this anemia/low plt is multi-factorial.  He has iron def.  Did receive IV iron w/ FeraHeme dose of 1020mg .  He has renal insuff. I suspect that his EPO level is low.  The low ptl could be medication. Could be MDS.  I hate to have to do a BM Bx and will hold off as long as possible.  No labs back today.  I do not see any problem w/ him going home if he is stable.  He needs Coumadin - unfortunately.  I will keep appt for next week in the office.  Pete E.

## 2011-11-13 NOTE — Discharge Summary (Signed)
Discharge Summary  [name] MR#: [id number]  DOB:September 14, 1921  Date of Admission: 11/11/2011 Date of Discharge: 11/13/2011  Patient's PCP: Jeffrey Moh, MD, MD  Attending Physician:[name]  Consults: 1. [place] [facility]. 2. Hematology: Dr. Arlan Rice.  Discharge Diagnoses: Principal Problem:  *Anemia Active Problems:  CAD, AUTOLOGOUS BYPASS GRAFT  Atrial fibrillation  CRT-PACEMAKER ST J  HYPERTENSION  CORONARY ATHEROSCLEROSIS NATIVE CORONARY ARTERY  AORTIC VALVE REPLACEMENT, HX OF  Congestive heart failure (CHF)  Fatigue  thrombocytopenia  Brief Admitting History and Physical 78 y/o M with hx of PAF on Coumadin, bioprosthetic AVR, CAD s/p CABG, prior ICM (EF not estimated by prior echo but felt low-normal 08/2009) who presented to [facility] at the advice of his primary care doctor. He saw his PCP Dr. Renne Rice several days ago today with complaints of increased fatigue with DOE. At that visit he was referred to heme/onc (appt pending) and renal. The patient saw Dr. Lowell Rice for kidney insufficiency who apparently did labs and found that the patient's Hgb was low so the patient was instructed to proceed to ER. [name] had noted general lack of energy over the past 6 weeks as well as dyspnea even with low-grade activity like tying his shoes. He denied any orthopnea, PND, SOB at rest, CP, palpitations, LEE, or abdominal swelling.  In the [facility] ER, Hgb is 7.2, Plts are 65. Last CBC from 02/2011 showed Hgb of 9.7 and platelet count of 73. FOBT is negative & the patient states he had a negative hemoccult last week at Dr. Carolee Rice office as well. No melena, hematemesis, BRBPR, epistaxis, overt hematuria although he does have Hgb in urine here.   Discharge Medications Current Discharge Medication List    CONTINUE these medications which have NOT CHANGED   Details  carvedilol (COREG) 25 MG tablet Take 25 mg by mouth daily.    furosemide (LASIX) 20 MG tablet  Take 20 mg by mouth daily.     omeprazole (PRILOSEC) 20 MG capsule Take 20 mg by mouth daily.     simvastatin (ZOCOR) 40 MG tablet Take 40 mg by mouth at bedtime.      Tamsulosin HCl (FLOMAX) 0.4 MG CAPS Take 0.4 mg by mouth daily.      vitamin B-12 (CYANOCOBALAMIN) 1000 MCG tablet Take 1,000 mcg by mouth daily.    warfarin (COUMADIN) 5 MG tablet Take 5 mg by mouth daily.         Hospital Course: 1. Symptomatic anemia: Patient has chronic anemia with hemoglobins in 2010 in the 10 g/dL range. He now presented with hemoglobin of 7.2. Unclear if this was a slow decline which it seems to be versus rapid drop. There are no clinical symptoms suggesting bleeding. Unfortunately no anemia panel was sent prior to blood transfusion. Patient is status post 2 units of packed red blood cells with appropriate rise in his hemoglobin. His anemia may be multifactorial secondary to chronic kidney disease, chronic disease. Other possibilities are slow GI bleed versus hemolysis. Patient received a dose of IV iron. Hematology consulted and will followup as an outpatient. Patient has an appointment on February 13. Patient is asymptomatic of his anemia at this time. May consider outpatient gastroenterology consultation as deemed necessary. Patient indicates he has had a negative colonoscopy 2-3 years ago by Dr. Virginia Rice. 2. Chronic thrombocytopenia: Along with anemia may have to consider hematological conditions such as MDS. Hematology followup as an outpatient for further evaluation. Stable.  3. Chronic kidney disease stage III: Creatinine is  stable. Outpatient followup with nephrology.  4. Atrial fibrillation, currently in V. paced rhythm. Resumed Coumadin. Advise patient to followup with [name] regarding INR checked on Monday, February 11 for Coumadin dose adjustment. Prior to hospitalization his anticoagulation was apparently stable. He will get a higher dose of Coumadin today prior to discharge. 5. History of ischemic  cardiomyopathy status post biventricular pacemaker  6. History of coronary artery disease status post CABG  7. History of bioprosthetic aortic valve replacement.  8. History of chronic systolic congestive heart failure: Clinically compensated. Lasix resumed.   Day of Discharge BP 123/46  Pulse 69  Temp(Src) 97.7 F (36.5 C) (Oral)  Resp 20  Ht 5\' 10"  (1.778 m)  Wt 93.849 kg (206 lb 14.4 oz)  BMI 29.69 kg/m2  SpO2 94%  General Exam: Comfortable. Sitting on a chair.  Respiratory System: Clear. No increased work of breathing.  Cardiovascular System: First and second heart sounds heard. Regular rate and rhythm. No JVD/murmurs or pedal edema. Telemetry shows V. paced rhythm.  Gastrointestinal System: Abdomen is non distended, soft and normal bowel sounds heard.  Central Nervous System: Alert and oriented. No focal neurological deficits.   Results for orders placed during the hospital encounter of 11/11/11 (from the past 48 hour(s))  TYPE AND SCREEN     Status: Normal   Collection Time   11/11/11  1:20 PM      Component Value Range Comment   ABO/RH(D) B NEG      Antibody Screen NEG      Sample Expiration 11/14/2011      Unit Number [id number]      Blood Component Type RED CELLS,LR      Unit division 00      Status of Unit ISSUED,FINAL      Transfusion Status OK TO TRANSFUSE      Crossmatch Result Compatible      Unit Number [id number]      Blood Component Type RED CELLS,LR      Unit division 00      Status of Unit ISSUED,FINAL      Transfusion Status OK TO TRANSFUSE      Crossmatch Result Compatible     URINALYSIS, ROUTINE W REFLEX MICROSCOPIC     Status: Abnormal   Collection Time   11/11/11  1:45 PM      Component Value Range Comment   Color, Urine YELLOW  YELLOW     APPearance CLEAR  CLEAR     Specific Gravity, Urine 1.010  1.005 - 1.030     pH 6.5  5.0 - 8.0     Glucose, UA NEGATIVE  NEGATIVE (mg/dL)    Hgb urine dipstick MODERATE (*) NEGATIVE     Bilirubin Urine  NEGATIVE  NEGATIVE     Ketones, ur NEGATIVE  NEGATIVE (mg/dL)    Protein, ur NEGATIVE  NEGATIVE (mg/dL)    Urobilinogen, UA 0.2  0.0 - 1.0 (mg/dL)    Nitrite NEGATIVE  NEGATIVE     Leukocytes, UA NEGATIVE  NEGATIVE    URINE MICROSCOPIC-ADD ON     Status: Normal   Collection Time   11/11/11  1:45 PM      Component Value Range Comment   Squamous Epithelial / LPF RARE  RARE     WBC, UA 0-2  <3 (WBC/hpf)    RBC / HPF 3-6  <3 (RBC/hpf)   IRON AND TIBC     Status: Abnormal   Collection Time   11/11/11  3:59 PM  Component Value Range Comment   Iron 63  42 - 135 (ug/dL)    TIBC 782  956 - 213 (ug/dL)    Saturation Ratios 15 (*) 20 - 55 (%)    UIBC 348  125 - 400 (ug/dL)   FERRITIN     Status: Normal   Collection Time   11/11/11  3:59 PM      Component Value Range Comment   Ferritin 35  22 - 322 (ng/mL)   PREPARE RBC (CROSSMATCH)     Status: Normal   Collection Time   11/11/11  4:30 PM      Component Value Range Comment   Order Confirmation ORDER PROCESSED BY BLOOD BANK     CBC     Status: Abnormal   Collection Time   11/12/11  5:55 AM      Component Value Range Comment   WBC 6.2  4.0 - 10.5 (K/uL)    RBC 2.87 (*) 4.22 - 5.81 (MIL/uL)    Hemoglobin 8.5 (*) 13.0 - 17.0 (g/dL)    HCT 08.6 (*) 57.8 - 52.0 (%)    MCV 92.3  78.0 - 100.0 (fL)    MCH 29.6  26.0 - 34.0 (pg)    MCHC 32.1  30.0 - 36.0 (g/dL)    RDW 46.9 (*) 62.9 - 15.5 (%)    Platelets 47 (*) 150 - 400 (K/uL)   BASIC METABOLIC PANEL     Status: Abnormal   Collection Time   11/12/11  5:55 AM      Component Value Range Comment   Sodium 138  135 - 145 (mEq/L)    Potassium 3.7  3.5 - 5.1 (mEq/L)    Chloride 101  96 - 112 (mEq/L)    CO2 24  19 - 32 (mEq/L)    Glucose, Bld 103 (*) 70 - 99 (mg/dL)    BUN 33 (*) 6 - 23 (mg/dL)    Creatinine, Ser 5.28 (*) 0.50 - 1.35 (mg/dL)    Calcium 9.7  8.4 - 10.5 (mg/dL)    GFR calc non Af Amer 26 (*) >90 (mL/min)    GFR calc Af Amer 30 (*) >90 (mL/min)   PROTIME-INR     Status: Abnormal     Collection Time   11/12/11  5:55 AM      Component Value Range Comment   Prothrombin Time 19.9 (*) 11.6 - 15.2 (seconds)    INR 1.66 (*) 0.00 - 1.49    RETICULOCYTES     Status: Abnormal   Collection Time   11/12/11  4:12 PM      Component Value Range Comment   Retic Ct Pct 3.0  0.4 - 3.1 (%)    RBC. 3.04 (*) 4.22 - 5.81 (MIL/uL)    Retic Count, Manual 91.2  19.0 - 186.0 (K/uL)   CBC     Status: Abnormal   Collection Time   11/13/11  6:23 AM      Component Value Range Comment   WBC 6.0  4.0 - 10.5 (K/uL)    RBC 3.03 (*) 4.22 - 5.81 (MIL/uL)    Hemoglobin 9.0 (*) 13.0 - 17.0 (g/dL)    HCT 41.3 (*) 24.4 - 52.0 (%)    MCV 92.4  78.0 - 100.0 (fL)    MCH 29.7  26.0 - 34.0 (pg)    MCHC 32.1  30.0 - 36.0 (g/dL)    RDW 01.0 (*) 27.2 - 15.5 (%)    Platelets 46 (*) 150 - 400 (  K/uL) CONSISTENT WITH PREVIOUS RESULT  BASIC METABOLIC PANEL     Status: Abnormal   Collection Time   11/13/11  6:23 AM      Component Value Range Comment   Sodium 138  135 - 145 (mEq/L)    Potassium 4.0  3.5 - 5.1 (mEq/L)    Chloride 102  96 - 112 (mEq/L)    CO2 25  19 - 32 (mEq/L)    Glucose, Bld 102 (*) 70 - 99 (mg/dL)    BUN 35 (*) 6 - 23 (mg/dL)    Creatinine, Ser 1.61 (*) 0.50 - 1.35 (mg/dL)    Calcium 9.7  8.4 - 10.5 (mg/dL)    GFR calc non Af Amer 26 (*) >90 (mL/min)    GFR calc Af Amer 30 (*) >90 (mL/min)   PROTIME-INR     Status: Abnormal   Collection Time   11/13/11  6:23 AM      Component Value Range Comment   Prothrombin Time 18.7 (*) 11.6 - 15.2 (seconds)    INR 1.53 (*) 0.00 - 1.49     Lab data: 1. Troponin less than 0.30. Pro BNP 3705. 2. Anemia panel showed iron 63, total iron binding capacity 411, ferritin 35. 3. Liver function tests were unremarkable. 4. Patient's baseline creatinine is probably between 1.6 and 1.9. 5. Reticulocyte count 91. 6. Erythropoietin level 172   Dg Chest 2 View  11/11/2011  *RADIOLOGY REPORT*  Clinical Data: Shortness of breath.  Anemia.  CHEST - 2 VIEW   Comparison: 11/03/2011  Findings: The pacer device appears unchanged.  The patient is rotated to the right on today's exam, resulting in reduced diagnostic sensitivity and specificity.   Atherosclerotic calcification of the aortic arch noted.  There is evidence of prior CABG.  Pleural and hemidiaphragmatic calcifications suggest calcified pleural plaques bilaterally.  The borderline cardiomegaly noted. Prominent upper zone pulmonary vasculature is suspicious for pulmonary venous hypertension, without overt edema.  IMPRESSION:  1.  Borderline cardiomegaly with suspected pulmonary venous hypertension. 2.  Calcified pleural plaque suggesting prior asbestos exposure. 3.  Atherosclerosis.  Original Report Authenticated By: Dellia Cloud, M.D.     Disposition: Discharged home in stable condition  Diet: Heart healthy  Activity: Increase activity gradually   Follow-up Appts: Discharge Orders    Future Appointments: Provider: Department: Dept Phone: Center:   11/19/2011 3:30 PM Gwendolyn A. Maisie Fus [place] [phone] None   11/19/2011 4:30 PM Josph Macho, MD [facility] [phone] None   01/29/2012 9:45 AM Joice Lofts Caryl Bis, RN [facility] [phone] [facility]     Future Orders Please Complete By Expires   Diet - low sodium heart healthy      Increase activity slowly      Call MD for:  difficulty breathing, headache or visual disturbances         TESTS THAT NEED FOLLOW-UP CBC, BMP, PT and INR on 11/16/2010 at Dr. Janalyn Shy office.  Time spent on discharge, talking to the patient, and coordinating care: .   SignedMarcellus Scott, MD 11/13/2011, 12:46 PM

## 2011-11-17 ENCOUNTER — Encounter: Payer: Self-pay | Admitting: Internal Medicine

## 2011-11-19 ENCOUNTER — Ambulatory Visit (HOSPITAL_BASED_OUTPATIENT_CLINIC_OR_DEPARTMENT_OTHER): Payer: Medicare Other | Admitting: Hematology & Oncology

## 2011-11-19 ENCOUNTER — Other Ambulatory Visit (HOSPITAL_BASED_OUTPATIENT_CLINIC_OR_DEPARTMENT_OTHER): Payer: Medicare Other | Admitting: Lab

## 2011-11-19 ENCOUNTER — Ambulatory Visit: Payer: Medicare Other

## 2011-11-19 DIAGNOSIS — D509 Iron deficiency anemia, unspecified: Secondary | ICD-10-CM

## 2011-11-19 DIAGNOSIS — D631 Anemia in chronic kidney disease: Secondary | ICD-10-CM

## 2011-11-19 DIAGNOSIS — D469 Myelodysplastic syndrome, unspecified: Secondary | ICD-10-CM

## 2011-11-19 DIAGNOSIS — D649 Anemia, unspecified: Secondary | ICD-10-CM

## 2011-11-19 DIAGNOSIS — D696 Thrombocytopenia, unspecified: Secondary | ICD-10-CM

## 2011-11-19 DIAGNOSIS — N189 Chronic kidney disease, unspecified: Secondary | ICD-10-CM

## 2011-11-19 LAB — CBC WITH DIFFERENTIAL (CANCER CENTER ONLY)
MCH: 30.4 pg (ref 28.0–33.4)
MCHC: 31.2 g/dL — ABNORMAL LOW (ref 32.0–35.9)
MCV: 98 fL (ref 82–98)
Platelets: 48 10*3/uL — ABNORMAL LOW (ref 145–400)
RDW: 20.6 % — ABNORMAL HIGH (ref 11.1–15.7)

## 2011-11-19 LAB — MANUAL DIFFERENTIAL (CHCC SATELLITE)
ALC: 3.3 10*3/uL (ref 0.9–3.3)
ANC (CHCC HP manual diff): 1.1 10*3/uL — ABNORMAL LOW (ref 1.5–6.5)
MONO: 16 % — ABNORMAL HIGH (ref 0–13)
Metamyelocytes: 1 % — ABNORMAL HIGH (ref 0–0)
Other Cells: 16 % — ABNORMAL HIGH (ref 0–0)
PLT EST ~~LOC~~: DECREASED

## 2011-11-20 ENCOUNTER — Encounter: Payer: Self-pay | Admitting: Hematology & Oncology

## 2011-11-20 DIAGNOSIS — D696 Thrombocytopenia, unspecified: Secondary | ICD-10-CM

## 2011-11-20 DIAGNOSIS — D631 Anemia in chronic kidney disease: Secondary | ICD-10-CM

## 2011-11-20 DIAGNOSIS — N189 Chronic kidney disease, unspecified: Secondary | ICD-10-CM

## 2011-11-20 DIAGNOSIS — D509 Iron deficiency anemia, unspecified: Secondary | ICD-10-CM

## 2011-11-20 HISTORY — DX: Chronic kidney disease, unspecified: N18.9

## 2011-11-20 HISTORY — DX: Thrombocytopenia, unspecified: D69.6

## 2011-11-20 HISTORY — DX: Anemia in chronic kidney disease: D63.1

## 2011-11-20 HISTORY — DX: Iron deficiency anemia, unspecified: D50.9

## 2011-11-20 LAB — IRON AND TIBC
%SAT: 40 % (ref 20–55)
TIBC: 355 ug/dL (ref 215–435)
UIBC: 214 ug/dL (ref 125–400)

## 2011-11-20 LAB — COMPREHENSIVE METABOLIC PANEL
ALT: 21 U/L (ref 0–53)
AST: 25 U/L (ref 0–37)
Creatinine, Ser: 2.08 mg/dL — ABNORMAL HIGH (ref 0.50–1.35)
Total Bilirubin: 0.6 mg/dL (ref 0.3–1.2)

## 2011-11-20 NOTE — Progress Notes (Signed)
This office note has been dictated.

## 2011-11-20 NOTE — Progress Notes (Signed)
CC:   [name]. [name], M.D. Richard F. Caryn Section, M.D.  DIAGNOSES: 1. Anemia, multifactorial. 2. Thrombocytopenia.  HISTORY OF PRESENT ILLNESS:  [name] is an 78 year old white gentleman. He does have multiple medical problems.  He has chronic kidney disease. He has a history of past prostate cancer.  He has history of colon cancer.  He has coronary artery disease.  He is on Coumadin.  He recently was hospitalized because of dyspnea.  He had marked anemia. He also thrombocytopenia.  I initially saw him back in 2007.  At that point in time, he was seen for mild pancytopenia.  At that point in time, his white count was 3.1, hemoglobin 14, and platelet count was 100,000.  We have not seen him back since then.  When he was admitted to [facility] back on February 5th, I think his hemoglobin was 7.2,  platelet count was 65.  He was iron deficient. When he was came in, his ferritin was  only 35.  Iron saturation was 15.  He does have chronic renal insufficiency.  His BUN and creatinine were 33 and 2.1 when he came.  He did receive Feraheme at a dose of 1020 mg.  I think this was on the 7th.  His erythropoietin level in the hospital was 172.  He did get 2 units of blood.  We now are seeing him to try to help with his anemia and thrombocytopenia.  He denies any obvious bleeding.  He is on Coumadin.  He seems to be doing fairly well with Coumadin.  He has not noted any fever.  He has had no changes in his appetite. He is not a vegetarian.  He has occasional leg swelling.  He has had no obvious bony pain.  Does have some arthritic issues.  We are now seeing him again in consultation to help improve his quality of life and improve his blood counts.  PAST MEDICAL HISTORY: 1. Localized prostate cancer. This was back in 1995.  I think he did     receive some radiation therapy for this. 2. Localized colon cancer in 2000. 3. Periaortic stenosis. 4. Hypertension. 5. Chronic kidney  disease. 6. Hyperlipidemia. 7. GERD.  ALLERGIES:  None.  MEDICATIONS:  Coreg 25 mg p.o. daily, Lasix 20 mg p.o. daily, Prilosec 20 mg p.o. daily, Zocor 40 mg p.o. daily, Flomax 0.4 mg daily, vitamin B12 1000 mcg p.o. daily, Coumadin 5 mg p.o. daily, Reglan 5 mg p.o. q.h.s. p.r.n.  SOCIAL HISTORY:  Remarkable for no tobacco use.  There is no history of alcohol use.  He did own a body shop.  As such, there was exposure to a lot of chemicals, solvents and paints.  FAMILY HISTORY:  Remarkable for diabetes, and coronary artery disease.  REVIEW OF SYSTEMS:  As stated in history of present illness.  PHYSICAL EXAMINATION:  This is a elderly white gentleman in no obvious distress.  Vital signs show a temperature of 97.3, pulse 70, respiratory rate 16, blood pressure 102/52.  Weight is 209.  Head and neck: Normocephalic, atraumatic skull.  He has no ocular or oral lesions.  He has some slight conjunctival pallor.  Neck:  Supple with no adenopathy. Lungs:  Some scattered crackles bilaterally.  He has decent air movement bilaterally.  Cardiac:  Regular rate and rhythm with occasional extra beat.  There is a 2/6 systolic ejection murmur.  Abdomen:  Soft with good bowel sounds.  There is no fluid wave.  There is no palpable  abdominal mass.  There is no palpable hepatosplenomegaly. Back:  No tenderness over the spine, ribs, or hips.  Extremities:  No clubbing. Extremities show some trace edema in his lower extremities.  He has some stasis dermatitis-type changes in his lower extremities.  Skin:  Some actinic keratosis that are scattered.  He has some scattered ecchymoses. Neurologic:  No focal neurological deficits.  LABORATORY STUDIES:  White count 6.7, hemoglobin 9.6, hematocrit 30.8, platelet count 48,000. Peripheral smear shows a mild anisocytosis and poikilocytosis.  There are no nucleated red blood cells.  I see no teardrop cells.  He has no target cells.  I see no rouleaux formation.   White cells appear with some increase in atypical lymphocytes.  He has some large lymphocytes. There may be some increase in monocytes.  I do not see any blasts. There are no hypersegmented polys.  Platelets are decreased.  He has a couple large platelets.  Platelets appear well granulated.  IMPRESSION:  [name] is an 78 year old gentleman with I think 2 separate hematologic issues.  He has anemia.  I suspect that this might be a combination of different factors.  He was iron deficient in the hospital.  He did get iron.  His blood count is better.  However, I think we need to get his blood count up a little bit higher.  I am rechecking his erythropoietin level.  I have to believe that his erythropoietin level is on the low side and if so, then he would be a good candidate for ESA.  I suppose he also may have myelodysplasia.  On his blood smear, he has these atypical mononuclear type cells.  It is possible he may have an underlying hematologic malignancy.  I think the only way we would really know this is to do a bone marrow biopsy, which I just would want to avoid if we can, given his age and his overall performance status (ECOG 3).  One possibility that might "tie things together" is a hairy cell leukemia.  He does not have splenomegaly by my exam, however.  Again, I think the only way we would really know this would be to do a bone marrow test.  Alternatively, we could consider doing a flow cytometry on his blood.  I would prefer to hold off on this as this is quite expensive and certainly may not affect our management strategies.  I do not see a real problem with his thrombocytopenia and him being on Coumadin.  These would affect 2 totally different pathways with respect to  blood coagulation.  I just do not see that him being on Coumadin would be an issue with his thrombocytopenia.  If he were on aspirin, that would be a different story.  It was nice to see [name] again.  He  is a nice guy.  He was in, I think, [place].  We talked a lot about this.  We will be back in touch with [name] once we get the lab results back. Again, hopefully, we do not have to do a bone marrow test on him. Hopefully we can just use ESA and get his blood count up that way.  We will have to be cognizant of his platelet count.  I do not want to see his platelet count go much lower. If so, then we may have to consider a bone marrow test.  I spent a good hour or more with [name]. Again, he is a real nice  guy.    ______________________________ Josph Macho, M.D. PRE/MEDQ  D:  11/20/2011  T:  11/20/2011  Job:  [id number]  ADDENDUM:  Epo level only 64.  Will try Aranesp.

## 2011-11-25 ENCOUNTER — Other Ambulatory Visit: Payer: Self-pay | Admitting: Internal Medicine

## 2011-11-25 MED ORDER — FUROSEMIDE 20 MG PO TABS: 20.0000 mg | ORAL_TABLET | Freq: Every day | ORAL | Status: DC

## 2011-11-26 ENCOUNTER — Telehealth: Payer: Self-pay | Admitting: Hematology & Oncology

## 2011-11-26 NOTE — Telephone Encounter (Signed)
Faxed last office note and labs to [facility] in response to medical records request.

## 2011-11-26 NOTE — Telephone Encounter (Signed)
Received authorization for release of medical information from [facility] requesting last office note and labs.

## 2011-12-11 ENCOUNTER — Encounter: Payer: Self-pay | Admitting: Internal Medicine

## 2011-12-11 ENCOUNTER — Ambulatory Visit (INDEPENDENT_AMBULATORY_CARE_PROVIDER_SITE_OTHER): Payer: Medicare Other | Admitting: Internal Medicine

## 2011-12-11 DIAGNOSIS — I5032 Chronic diastolic (congestive) heart failure: Secondary | ICD-10-CM

## 2011-12-11 DIAGNOSIS — I509 Heart failure, unspecified: Secondary | ICD-10-CM

## 2011-12-11 DIAGNOSIS — I4891 Unspecified atrial fibrillation: Secondary | ICD-10-CM

## 2011-12-11 DIAGNOSIS — Z950 Presence of cardiac pacemaker: Secondary | ICD-10-CM

## 2011-12-11 NOTE — Assessment & Plan Note (Signed)
No sign assoc symptoms with adequate rate control  Continue current course

## 2011-12-11 NOTE — Assessment & Plan Note (Signed)
The patient's device was interrogated.  The information was reviewed. No changes were made in the programming.   As above with afib now persistent

## 2011-12-11 NOTE — Assessment & Plan Note (Signed)
Stable

## 2011-12-11 NOTE — Progress Notes (Signed)
  HPI  [name] is a 78 y.o. male is seen following CRT P. implantation for symptomatic bradycardia in this state of ischemic heart disease prior bypass surgery status post bioprosthetic aortic valve and ejection fraction of 40%.  He had paroxysmal atrial fibrillation associated with congestive heart failure   Now he has by remote monitoring more presistent afib  This is largely asymptomatic  He was treated and is being evaluated for anemia in the cotext of renal insuff  Cr 2.1 feb/13  Review of prior data demonstrates that his echo cardiogram last fall demonstrated normal left ventricular function   Past Medical History  Diagnosis Date  . Atrial fibrillation     Paroxysmal, on Coumadin  . CAD (coronary artery disease)     CABGx6 10/2006  . Aortic stenosis     23-mm [employer] Biocor porcine bioprosthetic at time of CABG 10/2006  . Hypertension   . Colon cancer     s/p colon resection  . Prostate cancer   . GERD (gastroesophageal reflux disease)   . Ischemic cardiomyopathy     EF 40% previously s/p BiV [employer] Jude PPM. Most recent echo 08/2009 - EF could not be estimated but appeared to be low-normal.  . Shortness of breath   . Anemia   . Blood transfusion     no hx of reaction to transfusions"  . Anemia, iron deficiency 11/20/2011  . Anemia of renal disease 11/20/2011  . Thrombocytopenia 11/20/2011    Past Surgical History  Procedure Date  . Insert / replace / remove pacemaker     biventricular, [employer] Jude  . Coronary artery bypass graft   . Aortic valve replacement   . Left eye cataract extraction   . Appendectomy   . Hemicolectomy 2000    right  . Cardiac surgery     Current Outpatient Prescriptions  Medication Sig Dispense Refill  . furosemide (LASIX) 20 MG tablet Take 1 tablet (20 mg total) by mouth daily.  30 tablet  6  . metoCLOPramide (REGLAN) 5 MG tablet Take 5 mg by mouth at bedtime as needed.      . mirtazapine (REMERON) 15 MG tablet Take 15 mg by mouth at  bedtime.      Marland Kitchen omeprazole (PRILOSEC) 20 MG capsule Take 20 mg by mouth daily.       . simvastatin (ZOCOR) 40 MG tablet Take 40 mg by mouth at bedtime.        . Tamsulosin HCl (FLOMAX) 0.4 MG CAPS Take 0.4 mg by mouth daily.        . vitamin B-12 (CYANOCOBALAMIN) 1000 MCG tablet Take 1,000 mcg by mouth daily.      Marland Kitchen warfarin (COUMADIN) 5 MG tablet Take 5 mg by mouth daily.         No Known Allergies  Review of Systems negative except from HPI and PMH  Physical Exam Well developed and well nourished in no acute distress HENT normal E scleral and icterus clear Neck Supple Clear to ausculation Regular rate and rhythm, no murmurs gallops or rub Soft with active bowel sounds No clubbing cyanosis and edema Alert and oriented, grossly normal motor and sensory function; although he walks with a cane Skin Warm and Dry   Assessment and  Plan

## 2011-12-12 ENCOUNTER — Other Ambulatory Visit: Payer: Self-pay | Admitting: Hematology & Oncology

## 2011-12-12 DIAGNOSIS — N189 Chronic kidney disease, unspecified: Secondary | ICD-10-CM

## 2011-12-12 DIAGNOSIS — D631 Anemia in chronic kidney disease: Secondary | ICD-10-CM

## 2011-12-15 ENCOUNTER — Telehealth: Payer: Self-pay | Admitting: Hematology & Oncology

## 2011-12-15 NOTE — Telephone Encounter (Signed)
Left pt message with 3-12 and 4-2 appointment times

## 2011-12-16 ENCOUNTER — Ambulatory Visit (HOSPITAL_BASED_OUTPATIENT_CLINIC_OR_DEPARTMENT_OTHER): Payer: Medicare Other

## 2011-12-16 ENCOUNTER — Other Ambulatory Visit (HOSPITAL_BASED_OUTPATIENT_CLINIC_OR_DEPARTMENT_OTHER): Payer: Medicare Other | Admitting: Lab

## 2011-12-16 DIAGNOSIS — D509 Iron deficiency anemia, unspecified: Secondary | ICD-10-CM

## 2011-12-16 DIAGNOSIS — D649 Anemia, unspecified: Secondary | ICD-10-CM

## 2011-12-16 DIAGNOSIS — N189 Chronic kidney disease, unspecified: Secondary | ICD-10-CM

## 2011-12-16 LAB — MANUAL DIFFERENTIAL (CHCC SATELLITE)
ALC: 3.8 10*3/uL — ABNORMAL HIGH (ref 0.9–3.3)
ANC (CHCC HP manual diff): 1 10*3/uL — ABNORMAL LOW (ref 1.5–6.5)
LYMPH: 55 % — ABNORMAL HIGH (ref 14–48)
PLT EST ~~LOC~~: DECREASED

## 2011-12-16 LAB — CBC WITH DIFFERENTIAL (CANCER CENTER ONLY)
HCT: 31.4 % — ABNORMAL LOW (ref 38.7–49.9)
HGB: 10 g/dL — ABNORMAL LOW (ref 13.0–17.1)
MCV: 98 fL (ref 82–98)
RDW: 20.7 % — ABNORMAL HIGH (ref 11.1–15.7)
WBC: 6.9 10*3/uL (ref 4.0–10.0)

## 2011-12-16 LAB — IRON AND TIBC
%SAT: 38 % (ref 20–55)
Iron: 96 ug/dL (ref 42–165)
TIBC: 255 ug/dL (ref 215–435)
UIBC: 159 ug/dL (ref 125–400)

## 2011-12-16 LAB — RETICULOCYTES (CHCC): Retic Ct Pct: 0.9 % (ref 0.4–2.3)

## 2011-12-16 MED ORDER — DARBEPOETIN ALFA-POLYSORBATE 300 MCG/0.6ML IJ SOLN
300.0000 ug | Freq: Once | INTRAMUSCULAR | Status: AC
  Administered 2011-12-16: 300 ug via SUBCUTANEOUS

## 2011-12-17 ENCOUNTER — Other Ambulatory Visit: Payer: Self-pay

## 2011-12-26 ENCOUNTER — Encounter: Payer: Self-pay | Admitting: Hematology & Oncology

## 2012-01-06 ENCOUNTER — Other Ambulatory Visit (HOSPITAL_BASED_OUTPATIENT_CLINIC_OR_DEPARTMENT_OTHER): Payer: Medicare Other | Admitting: Lab

## 2012-01-06 ENCOUNTER — Ambulatory Visit (HOSPITAL_BASED_OUTPATIENT_CLINIC_OR_DEPARTMENT_OTHER): Payer: Medicare Other | Admitting: Hematology & Oncology

## 2012-01-06 ENCOUNTER — Ambulatory Visit (HOSPITAL_BASED_OUTPATIENT_CLINIC_OR_DEPARTMENT_OTHER): Payer: Medicare Other

## 2012-01-06 VITALS — BP 111/63 | HR 69 | Temp 96.7°F | Ht 68.0 in | Wt 210.0 lb

## 2012-01-06 DIAGNOSIS — N189 Chronic kidney disease, unspecified: Secondary | ICD-10-CM

## 2012-01-06 DIAGNOSIS — D4620 Refractory anemia with excess of blasts, unspecified: Secondary | ICD-10-CM

## 2012-01-06 DIAGNOSIS — D649 Anemia, unspecified: Secondary | ICD-10-CM

## 2012-01-06 DIAGNOSIS — N289 Disorder of kidney and ureter, unspecified: Secondary | ICD-10-CM

## 2012-01-06 DIAGNOSIS — D509 Iron deficiency anemia, unspecified: Secondary | ICD-10-CM

## 2012-01-06 DIAGNOSIS — D696 Thrombocytopenia, unspecified: Secondary | ICD-10-CM

## 2012-01-06 LAB — MANUAL DIFFERENTIAL (CHCC SATELLITE)
LYMPH: 58 % — ABNORMAL HIGH (ref 14–48)
MONO: 20 % — ABNORMAL HIGH (ref 0–13)
PLT EST ~~LOC~~: DECREASED
SEG: 20 % — ABNORMAL LOW (ref 40–75)

## 2012-01-06 LAB — CBC WITH DIFFERENTIAL (CANCER CENTER ONLY)
HCT: 31.4 % — ABNORMAL LOW (ref 38.7–49.9)
MCH: 32.5 pg (ref 28.0–33.4)
MCHC: 32.2 g/dL (ref 32.0–35.9)
MCV: 101 fL — ABNORMAL HIGH (ref 82–98)
Platelets: 44 10*3/uL — ABNORMAL LOW (ref 145–400)
RDW: 21.2 % — ABNORMAL HIGH (ref 11.1–15.7)

## 2012-01-06 LAB — RETICULOCYTES (CHCC): ABS Retic: 51 10*3/uL (ref 19.0–186.0)

## 2012-01-06 MED ORDER — DARBEPOETIN ALFA-POLYSORBATE 300 MCG/0.6ML IJ SOLN
300.0000 ug | Freq: Once | INTRAMUSCULAR | Status: AC
  Administered 2012-01-06: 300 ug via SUBCUTANEOUS

## 2012-01-06 NOTE — Progress Notes (Signed)
CC:   [name]. [name], M.D. Richard F. Caryn Section, M.D.  DIAGNOSES: 1. Anemia of renal insufficiency.  2.  Thrombocytopenia, possible     myelodysplasia.  CURRENT THERAPY:  Aranesp 300 mcg subcu as needed for hemoglobin less than 11.  INTERVAL HISTORY:  [name] comes in for followup.  We did start him on Aranesp.  He got his 1st dose on March 12th.  We are also gave him a dose of iron back on 11/12/2011.  He feels a little bit better.  He has had no problems bleeding.  When he was followed in March, his ferritin was 282.  Iron saturation was 38%.  He does have a low erythropoietin level; it is 64.  He has had no bleeding.  He is on Coumadin.  He is on a few other medications.  He has had no fever.  PHYSICAL EXAM:  General:  This is an elderly white gentleman in no obvious distress.  Vital Signs: 96.7, pulse 69, respiratory rate 18, blood pressure 111/63.  Weight is 210.  Head and Neck:  Normocephalic, atraumatic skull.  There are no ocular or oral lesions.  There are no palpable cervical or supraclavicular lymph nodes.  Lungs:  Clear bilaterally.  Cardiac Exam:  Regular rate  and rhythm with a normal S1, S2.  There are no murmurs, rubs, or bruits.  Abdominal Exam:  Soft with good bowel sounds.  There is no palpable abdominal mass.  No palpable hepatosplenomegaly is noted.  Extremities:  Trace edema in his legs.  LABORATORY STUDIES:  White cell count is 5.9, hemoglobin 10.1, hematocrit 31.4, platelet count 44,000.  MCV is 101.  IMPRESSION:  [name] is an 78 year old gentleman with multiple issues. He does have some renal insufficiency.  His creatinine clearance is 26. As such, we have him on Aranesp.  He has thrombocytopenia.  This may be medication related.  This certainly could be myelodysplastic issues.  However, he is totally asymptomatic.  We will continue to have him come back every 3 weeks for right now.  I will plan to see him back in 6 weeks for followup and lab  work, and possible injection.    ______________________________ Josph Macho, M.D. PRE/MEDQ  D:  01/06/2012  T:  01/06/2012  Job:  [id number]

## 2012-01-06 NOTE — Patient Instructions (Signed)
Darbepoetin Alfa injection What is this medicine? DARBEPOETIN ALFA (dar be POE e tin AL fa) helps your body make more red blood cells. It is used to treat anemia caused by chronic kidney failure and chemotherapy. This medicine may be used for other purposes; ask your health care provider or pharmacist if you have questions. What should I tell my health care provider before I take this medicine? They need to know if you have any of these conditions: -blood clotting disorders or history of blood clots -cancer patient not on chemotherapy -cystic fibrosis -heart disease, such as angina, heart failure, or a history of a heart attack -hemoglobin level of 12 g/dL or greater -high blood pressure -low levels of folate, iron, or vitamin B12 -seizures -an unusual or allergic reaction to darbepoetin, erythropoietin, albumin, hamster proteins, latex, other medicines, foods, dyes, or preservatives -pregnant or trying to get pregnant -breast-feeding How should I use this medicine? This medicine is for injection into a vein or under the skin. It is usually given by a health care professional in a hospital or clinic setting. If you get this medicine at home, you will be taught how to prepare and give this medicine. Do not shake the solution before you withdraw a dose. Use exactly as directed. Take your medicine at regular intervals. Do not take your medicine more often than directed. It is important that you put your used needles and syringes in a special sharps container. Do not put them in a trash can. If you do not have a sharps container, call your pharmacist or healthcare provider to get one. Talk to your pediatrician regarding the use of this medicine in children. While this medicine may be used in children as young as 1 year for selected conditions, precautions do apply. Overdosage: If you think you have taken too much of this medicine contact a poison control center or emergency room at once. NOTE:  This medicine is only for you. Do not share this medicine with others. What if I miss a dose? If you miss a dose, take it as soon as you can. If it is almost time for your next dose, take only that dose. Do not take double or extra doses. What may interact with this medicine? Do not take this medicine with any of the following medications: -epoetin alfa This list may not describe all possible interactions. Give your health care provider a list of all the medicines, herbs, non-prescription drugs, or dietary supplements you use. Also tell them if you smoke, drink alcohol, or use illegal drugs. Some items may interact with your medicine. What should I watch for while using this medicine? Visit your prescriber or health care professional for regular checks on your progress and for the needed blood tests and blood pressure measurements. It is especially important for the doctor to make sure your hemoglobin level is in the desired range, to limit the risk of potential side effects and to give you the best benefit. Keep all appointments for any recommended tests. Check your blood pressure as directed. Ask your doctor what your blood pressure should be and when you should contact him or her. As your body makes more red blood cells, you may need to take iron, folic acid, or vitamin B supplements. Ask your doctor or health care provider which products are right for you. If you have kidney disease continue dietary restrictions, even though this medication can make you feel better. Talk with your doctor or health care professional about the   foods you eat and the vitamins that you take. What side effects may I notice from receiving this medicine? Side effects that you should report to your doctor or health care professional as soon as possible: -allergic reactions like skin rash, itching or hives, swelling of the face, lips, or tongue -breathing problems -changes in vision -chest pain -confusion, trouble speaking  or understanding -feeling faint or lightheaded, falls -high blood pressure -muscle aches or pains -pain, swelling, warmth in the leg -rapid weight gain -severe headaches -sudden numbness or weakness of the face, arm or leg -trouble walking, dizziness, loss of balance or coordination -seizures (convulsions) -swelling of the ankles, feet, hands -unusually weak or tired Side effects that usually do not require medical attention (report to your doctor or health care professional if they continue or are bothersome): -diarrhea -fever, chills (flu-like symptoms) -headaches -nausea, vomiting -redness, stinging, or swelling at site where injected This list may not describe all possible side effects. Call your doctor for medical advice about side effects. You may report side effects to FDA at 1-800-FDA-1088. Where should I keep my medicine? Keep out of the reach of children. Store in a refrigerator between 2 and 8 degrees C (36 and 46 degrees F). Do not freeze. Do not shake. Throw away any unused portion if using a single-dose vial. Throw away any unused medicine after the expiration date. NOTE: This sheet is a summary. It may not cover all possible information. If you have questions about this medicine, talk to your doctor, pharmacist, or health care provider.  2012, [employer]vier/[employer] Standard. (09/05/2008 10:23:57 AM) 

## 2012-01-06 NOTE — Progress Notes (Signed)
This office note has been dictated.

## 2012-01-27 ENCOUNTER — Ambulatory Visit (HOSPITAL_BASED_OUTPATIENT_CLINIC_OR_DEPARTMENT_OTHER): Payer: Medicare Other

## 2012-01-27 ENCOUNTER — Other Ambulatory Visit (HOSPITAL_BASED_OUTPATIENT_CLINIC_OR_DEPARTMENT_OTHER): Payer: Medicare Other | Admitting: Lab

## 2012-01-27 VITALS — BP 141/70 | HR 70 | Temp 97.1°F

## 2012-01-27 DIAGNOSIS — D631 Anemia in chronic kidney disease: Secondary | ICD-10-CM

## 2012-01-27 DIAGNOSIS — D649 Anemia, unspecified: Secondary | ICD-10-CM

## 2012-01-27 DIAGNOSIS — D509 Iron deficiency anemia, unspecified: Secondary | ICD-10-CM

## 2012-01-27 DIAGNOSIS — N289 Disorder of kidney and ureter, unspecified: Secondary | ICD-10-CM

## 2012-01-27 DIAGNOSIS — D4620 Refractory anemia with excess of blasts, unspecified: Secondary | ICD-10-CM

## 2012-01-27 LAB — IRON AND TIBC
Iron: 65 ug/dL (ref 42–165)
UIBC: 186 ug/dL (ref 125–400)

## 2012-01-27 LAB — RETICULOCYTES (CHCC)
RBC.: 3.11 MIL/uL — ABNORMAL LOW (ref 4.22–5.81)
Retic Ct Pct: 1.6 % (ref 0.4–2.3)

## 2012-01-27 LAB — MANUAL DIFFERENTIAL (CHCC SATELLITE)
LYMPH: 55 % — ABNORMAL HIGH (ref 14–48)
MONO: 28 % — ABNORMAL HIGH (ref 0–13)
SEG: 17 % — ABNORMAL LOW (ref 40–75)

## 2012-01-27 LAB — FERRITIN: Ferritin: 150 ng/mL (ref 22–322)

## 2012-01-27 LAB — CBC WITH DIFFERENTIAL (CANCER CENTER ONLY)
Platelets: 52 10*3/uL — ABNORMAL LOW (ref 145–400)
RDW: 20.5 % — ABNORMAL HIGH (ref 11.1–15.7)
WBC: 5.3 10*3/uL (ref 4.0–10.0)

## 2012-01-27 MED ORDER — DARBEPOETIN ALFA-POLYSORBATE 300 MCG/0.6ML IJ SOLN
300.0000 ug | Freq: Once | INTRAMUSCULAR | Status: AC
  Administered 2012-01-27: 300 ug via SUBCUTANEOUS

## 2012-01-29 ENCOUNTER — Encounter: Payer: Self-pay | Admitting: Internal Medicine

## 2012-01-29 ENCOUNTER — Ambulatory Visit (INDEPENDENT_AMBULATORY_CARE_PROVIDER_SITE_OTHER): Payer: Medicare Other | Admitting: *Deleted

## 2012-01-29 DIAGNOSIS — I4891 Unspecified atrial fibrillation: Secondary | ICD-10-CM

## 2012-01-29 DIAGNOSIS — I495 Sick sinus syndrome: Secondary | ICD-10-CM

## 2012-01-30 LAB — REMOTE PACEMAKER DEVICE
AL IMPEDENCE PM: 300 Ohm
BATTERY VOLTAGE: 2.96 V
DEVICE MODEL PM: 2317387
LV LEAD IMPEDENCE PM: 740 Ohm
RV LEAD AMPLITUDE: 9.1 mv

## 2012-02-03 NOTE — Progress Notes (Signed)
Remote pacer check  

## 2012-02-06 IMAGING — CR DG CHEST 2V
2 series · 2 of 2 positions shown · non-contrast
Comparison: 11/03/2011

CLINICAL DATA: Shortness of breath.  Anemia.

CHEST - 2 VIEW

[w chest pa]
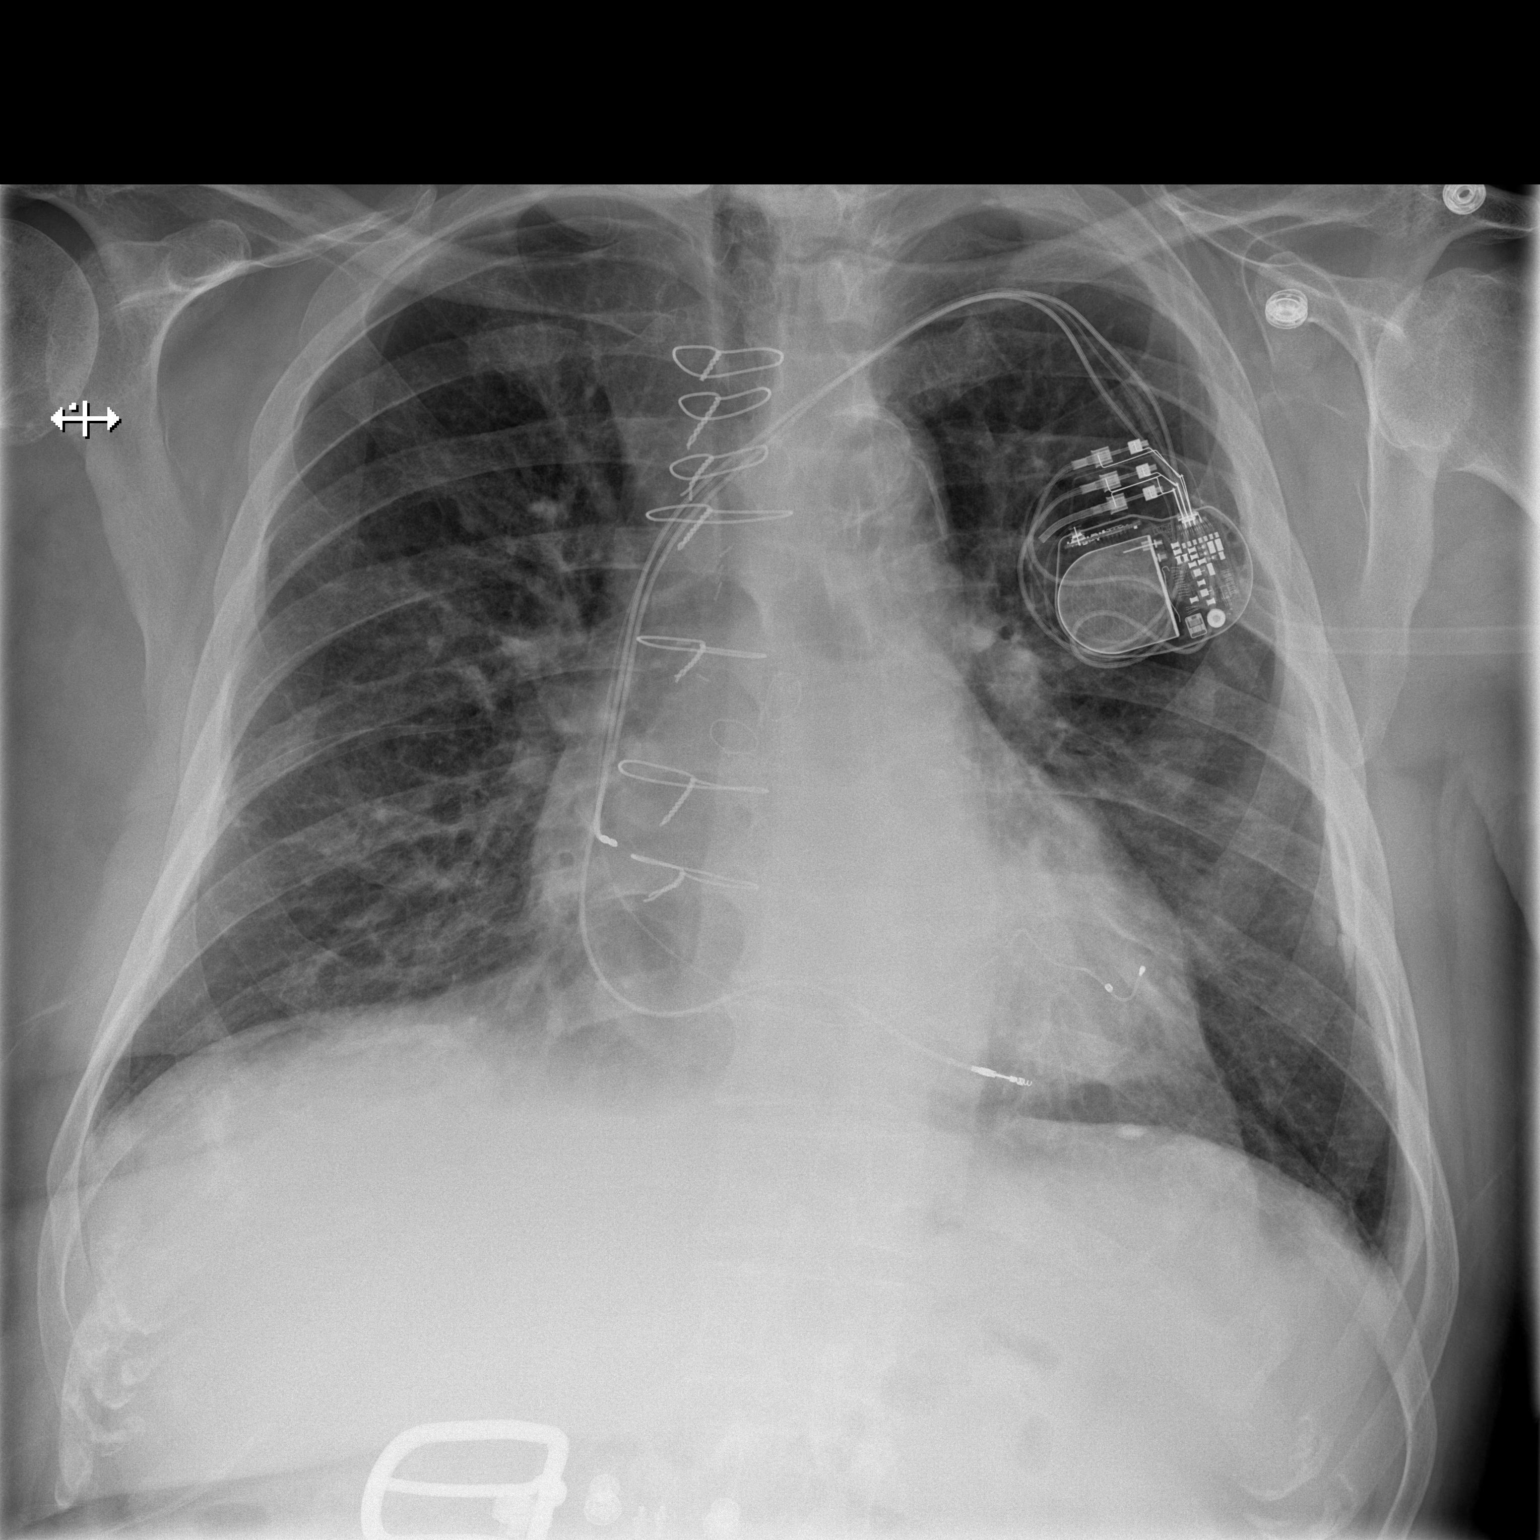

[w chest lat]
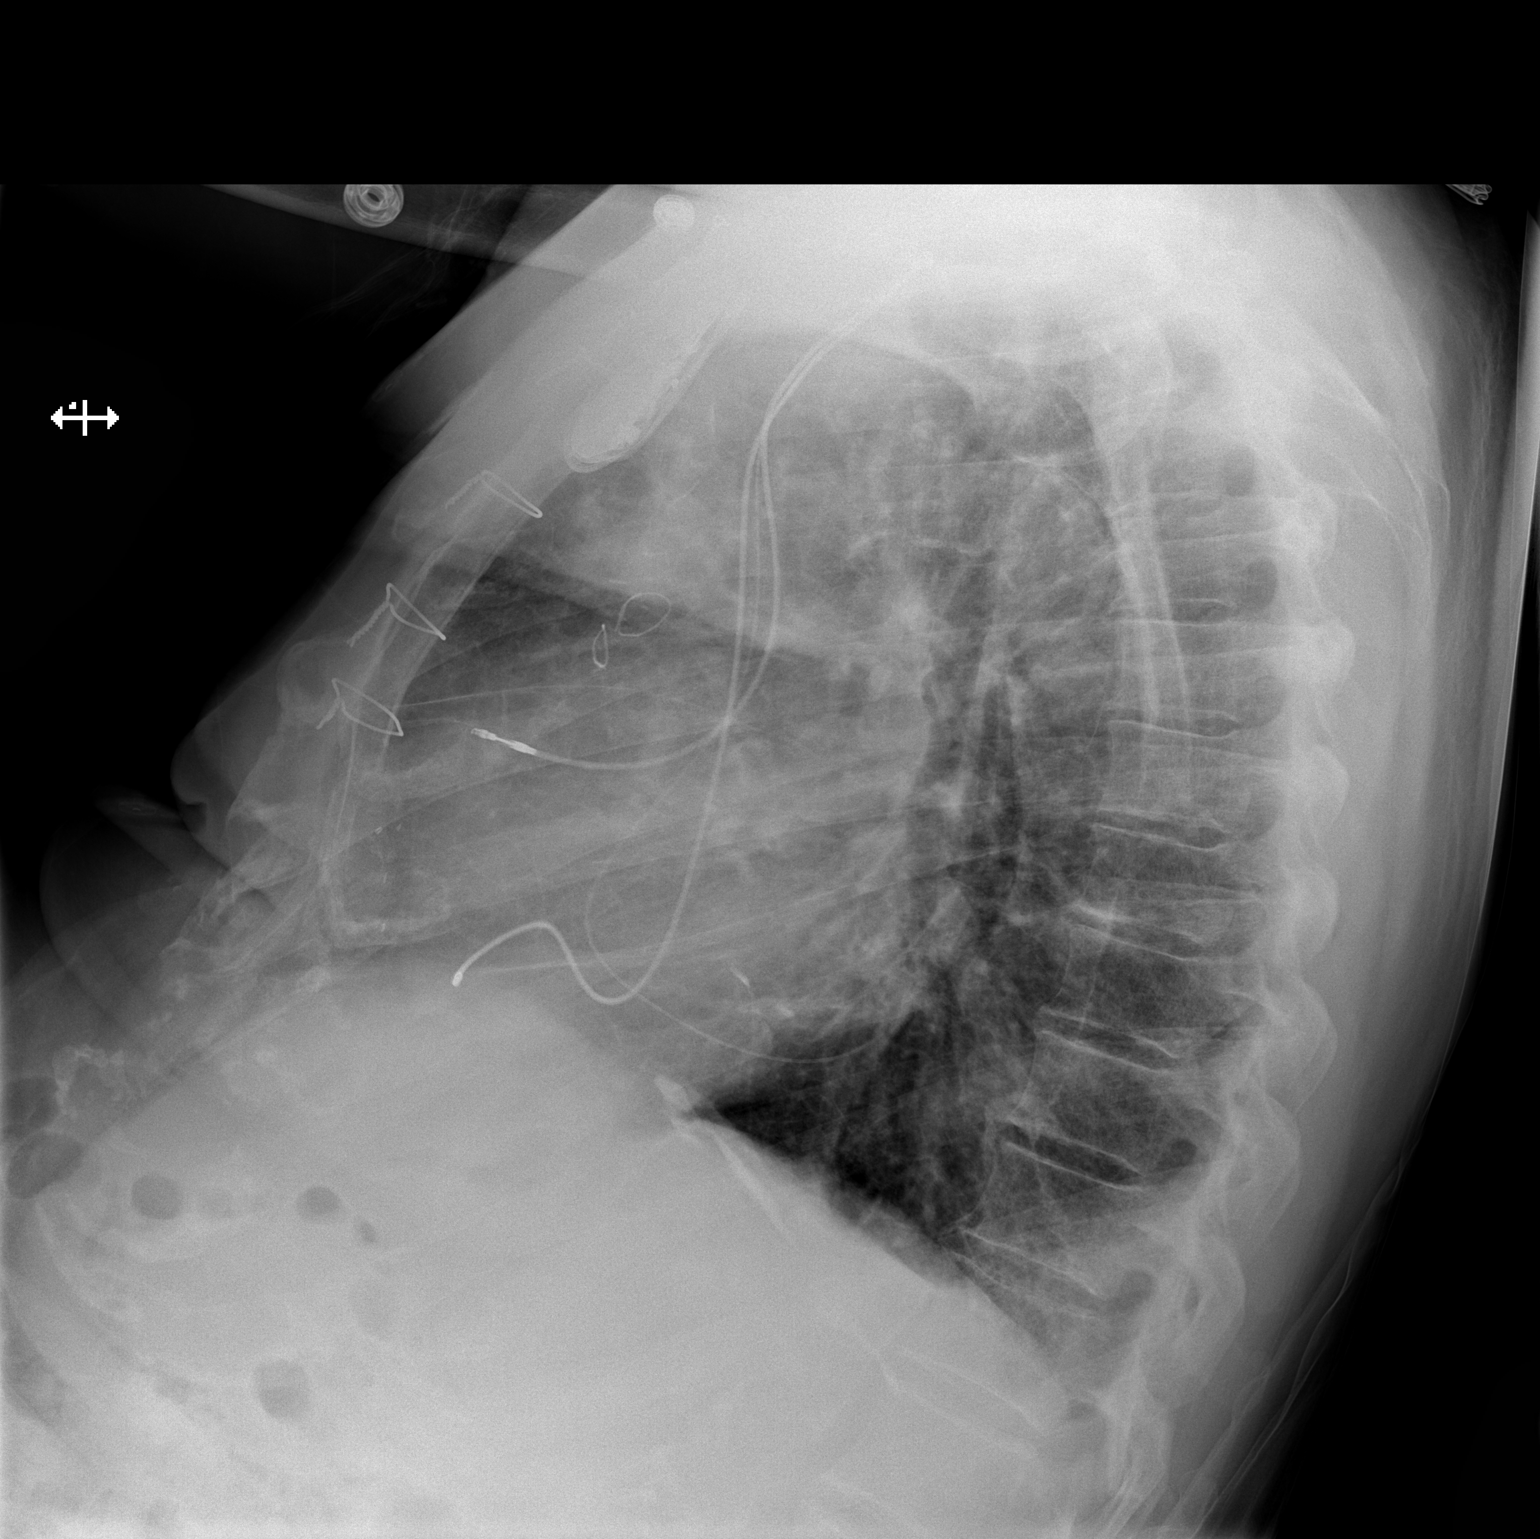

[2 of 2 positions shown; findings below may reference images not displayed]

FINDINGS: The pacer device appears unchanged.

The patient is rotated to the right on today's exam, resulting in
reduced diagnostic sensitivity and specificity.   Atherosclerotic
calcification of the aortic arch noted.  There is evidence of prior
CABG.

Pleural and hemidiaphragmatic calcifications suggest calcified
pleural plaques bilaterally.  The borderline cardiomegaly noted.
Prominent upper zone pulmonary vasculature is suspicious for
pulmonary venous hypertension, without overt edema.
IMPRESSION: 1.  Borderline cardiomegaly with suspected pulmonary venous
hypertension.
2.  Calcified pleural plaque suggesting prior asbestos exposure.
3.  Atherosclerosis.

## 2012-02-09 ENCOUNTER — Telehealth: Payer: Self-pay | Admitting: *Deleted

## 2012-02-09 NOTE — Telephone Encounter (Addendum)
Message copied by Mirian Capuchin on Mon Feb 09, 2012  5:20 PM ------      Message from: Arlan Organ R      Created: Wed Jan 28, 2012  6:52 PM       Call - iron is dropping .  He needs Feraheme 1020mg  x 1 dose in 1-2 weeks.  Please set this up!!!  Cindee Lame This message given to pt's wife.  She states they have an appt to see Dr. Myna Hidalgo on 02/17/12.  They will get the [facility] after they see the doctor.  Wife voices understanding.

## 2012-02-12 ENCOUNTER — Other Ambulatory Visit: Payer: Self-pay | Admitting: *Deleted

## 2012-02-12 DIAGNOSIS — D509 Iron deficiency anemia, unspecified: Secondary | ICD-10-CM

## 2012-02-17 ENCOUNTER — Ambulatory Visit (HOSPITAL_BASED_OUTPATIENT_CLINIC_OR_DEPARTMENT_OTHER): Payer: Medicare Other | Admitting: Hematology & Oncology

## 2012-02-17 ENCOUNTER — Ambulatory Visit: Payer: Medicare Other

## 2012-02-17 ENCOUNTER — Other Ambulatory Visit (HOSPITAL_BASED_OUTPATIENT_CLINIC_OR_DEPARTMENT_OTHER): Payer: Medicare Other | Admitting: Lab

## 2012-02-17 ENCOUNTER — Other Ambulatory Visit: Payer: Medicare Other | Admitting: Lab

## 2012-02-17 ENCOUNTER — Ambulatory Visit (HOSPITAL_BASED_OUTPATIENT_CLINIC_OR_DEPARTMENT_OTHER): Payer: Medicare Other

## 2012-02-17 ENCOUNTER — Ambulatory Visit: Payer: Medicare Other | Admitting: Hematology & Oncology

## 2012-02-17 VITALS — BP 135/66 | HR 69 | Temp 96.9°F | Ht 68.0 in | Wt 205.0 lb

## 2012-02-17 DIAGNOSIS — D696 Thrombocytopenia, unspecified: Secondary | ICD-10-CM

## 2012-02-17 DIAGNOSIS — D631 Anemia in chronic kidney disease: Secondary | ICD-10-CM

## 2012-02-17 DIAGNOSIS — D4620 Refractory anemia with excess of blasts, unspecified: Secondary | ICD-10-CM

## 2012-02-17 DIAGNOSIS — D509 Iron deficiency anemia, unspecified: Secondary | ICD-10-CM

## 2012-02-17 LAB — MANUAL DIFFERENTIAL (CHCC SATELLITE)
Eos: 1 % (ref 0–7)
MONO: 16 % — ABNORMAL HIGH (ref 0–13)
nRBC: 1 % — ABNORMAL HIGH (ref 0–0)

## 2012-02-17 LAB — CBC WITH DIFFERENTIAL (CANCER CENTER ONLY)
HCT: 30.6 % — ABNORMAL LOW (ref 38.7–49.9)
HGB: 9.9 g/dL — ABNORMAL LOW (ref 13.0–17.1)
MCH: 33.2 pg (ref 28.0–33.4)
MCHC: 32.4 g/dL (ref 32.0–35.9)

## 2012-02-17 LAB — RETICULOCYTES (CHCC)
ABS Retic: 49.1 10*3/uL (ref 19.0–186.0)
RBC.: 3.07 MIL/uL — ABNORMAL LOW (ref 4.22–5.81)
Retic Ct Pct: 1.6 % (ref 0.4–2.3)

## 2012-02-17 MED ORDER — SODIUM CHLORIDE 0.9 % IV SOLN
1020.0000 mg | Freq: Once | INTRAVENOUS | Status: AC
  Administered 2012-02-17: 1020 mg via INTRAVENOUS
  Filled 2012-02-17: qty 34

## 2012-02-17 MED ORDER — DARBEPOETIN ALFA-POLYSORBATE 300 MCG/0.6ML IJ SOLN
300.0000 ug | Freq: Once | INTRAMUSCULAR | Status: DC
  Administered 2012-02-17: 300 ug via SUBCUTANEOUS

## 2012-02-17 NOTE — Progress Notes (Signed)
This office note has been dictated.

## 2012-02-17 NOTE — Patient Instructions (Signed)
Ferumoxytol injection What is this medicine? FERUMOXYTOL is an iron complex. Iron is used to make healthy red blood cells, which carry oxygen and nutrients throughout the body. This medicine is used to treat iron deficiency anemia in people with chronic kidney disease. This medicine may be used for other purposes; ask your health care provider or pharmacist if you have questions. What should I tell my health care provider before I take this medicine? They need to know if you have any of these conditions: -anemia not caused by low iron levels -high levels of iron in the blood -magnetic resonance imaging (MRI) test scheduled -an unusual or allergic reaction to iron, other medicines, foods, dyes, or preservatives -pregnant or trying to get pregnant -breast-feeding How should I use this medicine? This medicine is for infusion into a vein. It is given by a health care professional in a hospital or clinic setting. Talk to your pediatrician regarding the use of this medicine in children. Special care may be needed. Overdosage: If you think you've taken too much of this medicine contact a poison control center or emergency room at once. Overdosage: If you think you have taken too much of this medicine contact a poison control center or emergency room at once. NOTE: This medicine is only for you. Do not share this medicine with others. What if I miss a dose? It is important not to miss your dose. Call your doctor or health care professional if you are unable to keep an appointment. What may interact with this medicine? This medicine may interact with the following medications: -other iron products This list may not describe all possible interactions. Give your health care provider a list of all the medicines, herbs, non-prescription drugs, or dietary supplements you use. Also tell them if you smoke, drink alcohol, or use illegal drugs. Some items may interact with your medicine. What should I watch  for while using this medicine? Visit your doctor or healthcare professional regularly. Tell your doctor or healthcare professional if your symptoms do not start to get better or if they get worse. You may need blood work done while you are taking this medicine. You may need to follow a special diet. Talk to your doctor. Foods that contain iron include: whole grains/cereals, dried fruits, beans, or peas, leafy green vegetables, and organ meats (liver, kidney). What side effects may I notice from receiving this medicine? Side effects that you should report to your doctor or health care professional as soon as possible: -allergic reactions like skin rash, itching or hives, swelling of the face, lips, or tongue -breathing problems -changes in blood pressure -feeling faint or lightheaded, falls -fever or chills -flushing, sweating, or hot feelings -swelling of the ankles or feet Side effects that usually do not require medical attention (Report these to your doctor or health care professional if they continue or are bothersome.): -diarrhea -headache -nausea, vomiting -stomach pain This list may not describe all possible side effects. Call your doctor for medical advice about side effects. You may report side effects to FDA at 1-800-FDA-1088. Where should I keep my medicine? This drug is given in a hospital or clinic and will not be stored at home. NOTE: This sheet is a summary. It may not cover all possible information. If you have questions about this medicine, talk to your doctor, pharmacist, or health care provider.  2012, [employer]vier/Gold Standard. (06/14/2008 9:48:25 PM) 

## 2012-02-18 NOTE — Progress Notes (Signed)
CC:   [name]. [name], M.D. Richard F. Caryn Section, M.D.  DIAGNOSES: 1. Anemia of renal insufficiency. 2. Refractory anemia with multilineage dysplasia.  CURRENT THERAPY:  Aranesp 300 mcg subcu as needed for hemoglobin less than 11.  INTERIM HISTORY:  [name] comes in for his followup.  We initially saw him a couple of months ago.  He is on Aranesp.  His anemia is improving slowly but surely.  He has had some thrombocytopenia.  There has been holding steady.  He has been asymptomatic.  He does have occasional leg swelling.  He has had no fevers.  He has had no nausea, vomiting.  His appetite seems to be doing okay.  Of note, we did give him iron back in February.  His ferritin was on the low side.  We gave him 1020 mg of Feraheme.  PHYSICAL EXAM:  This is an elderly white gentleman in no obvious distress.  Vital signs:  96.9, pulse 69, respiratory rate 20, blood pressure 135/66.  Weight is 205.  Head and neck:  Shows a normocephalic, atraumatic skull.  There are no ocular or oral lesions.  There are no palpable cervical or supraclavicular lymph nodes.  Lungs:  Clear bilaterally.  Cardiac:  Regular rate and rhythm with a normal S1, S2. He has a 1/6 systolic ejection murmur.  Abdomen:  Soft with good bowel sounds.  There is no palpable abdominal mass.  There is no palpable hepatosplenomegaly.  Extremities:  Shows 1+ edema.  Skin:  Numerous seborrheic keratoses and actinic keratoses.  LABORATORY STUDIES:  White cell count 6, hemoglobin 10, hematocrit 30.6, platelet count 48.  MCV is 103.  IMPRESSION:  [name] is an 78 year old gentleman with refractory anemia with multilineage dysplasia.  I do believe that he has myelodysplasia. He does have the thrombocytopenia.  We will continue to have him come in every 3-4 weeks.  We will give him iron today.  I suspect that there is some element of iron deficiency that we are going to have to deal with.  His ferritin was down to 150 which,  for him, is on the low side.  Iron saturation was down to 26%.  I will see [name] back myself in another 6 weeks or so.    ______________________________ Josph Macho, M.D. PRE/MEDQ  D:  02/17/2012  T:  02/17/2012  Job:  [id number]

## 2012-02-23 ENCOUNTER — Encounter: Payer: Self-pay | Admitting: *Deleted

## 2012-03-09 ENCOUNTER — Ambulatory Visit: Payer: Medicare Other

## 2012-03-09 ENCOUNTER — Other Ambulatory Visit: Payer: Medicare Other | Admitting: Lab

## 2012-03-16 ENCOUNTER — Other Ambulatory Visit (HOSPITAL_BASED_OUTPATIENT_CLINIC_OR_DEPARTMENT_OTHER): Payer: Medicare Other | Admitting: Lab

## 2012-03-16 ENCOUNTER — Ambulatory Visit (HOSPITAL_BASED_OUTPATIENT_CLINIC_OR_DEPARTMENT_OTHER): Payer: Medicare Other

## 2012-03-16 VITALS — BP 151/74 | HR 69 | Temp 97.0°F

## 2012-03-16 DIAGNOSIS — D649 Anemia, unspecified: Secondary | ICD-10-CM

## 2012-03-16 DIAGNOSIS — D631 Anemia in chronic kidney disease: Secondary | ICD-10-CM

## 2012-03-16 DIAGNOSIS — N289 Disorder of kidney and ureter, unspecified: Secondary | ICD-10-CM

## 2012-03-16 DIAGNOSIS — D509 Iron deficiency anemia, unspecified: Secondary | ICD-10-CM

## 2012-03-16 DIAGNOSIS — N189 Chronic kidney disease, unspecified: Secondary | ICD-10-CM

## 2012-03-16 LAB — MANUAL DIFFERENTIAL (CHCC SATELLITE)
LYMPH: 65 % — ABNORMAL HIGH (ref 14–48)
MONO: 22 % — ABNORMAL HIGH (ref 0–13)
SEG: 12 % — ABNORMAL LOW (ref 40–75)

## 2012-03-16 LAB — CBC WITH DIFFERENTIAL (CANCER CENTER ONLY)
HGB: 10.1 g/dL — ABNORMAL LOW (ref 13.0–17.1)
Platelets: 46 10*3/uL — ABNORMAL LOW (ref 145–400)
RBC: 2.93 10*6/uL — ABNORMAL LOW (ref 4.20–5.70)
WBC: 6.2 10*3/uL (ref 4.0–10.0)

## 2012-03-16 MED ORDER — DARBEPOETIN ALFA-POLYSORBATE 300 MCG/0.6ML IJ SOLN
300.0000 ug | Freq: Once | INTRAMUSCULAR | Status: AC
  Administered 2012-03-16: 300 ug via SUBCUTANEOUS

## 2012-03-16 NOTE — Patient Instructions (Signed)
Darbepoetin Alfa injection What is this medicine? DARBEPOETIN ALFA (dar be POE e tin AL fa) helps your body make more red blood cells. It is used to treat anemia caused by chronic kidney failure and chemotherapy. This medicine may be used for other purposes; ask your health care provider or pharmacist if you have questions. What should I tell my health care provider before I take this medicine? They need to know if you have any of these conditions: -blood clotting disorders or history of blood clots -cancer patient not on chemotherapy -cystic fibrosis -heart disease, such as angina, heart failure, or a history of a heart attack -hemoglobin level of 12 g/dL or greater -high blood pressure -low levels of folate, iron, or vitamin B12 -seizures -an unusual or allergic reaction to darbepoetin, erythropoietin, albumin, hamster proteins, latex, other medicines, foods, dyes, or preservatives -pregnant or trying to get pregnant -breast-feeding How should I use this medicine? This medicine is for injection into a vein or under the skin. It is usually given by a health care professional in a hospital or clinic setting. If you get this medicine at home, you will be taught how to prepare and give this medicine. Do not shake the solution before you withdraw a dose. Use exactly as directed. Take your medicine at regular intervals. Do not take your medicine more often than directed. It is important that you put your used needles and syringes in a special sharps container. Do not put them in a trash can. If you do not have a sharps container, call your pharmacist or healthcare provider to get one. Talk to your pediatrician regarding the use of this medicine in children. While this medicine may be used in children as young as 1 year for selected conditions, precautions do apply. Overdosage: If you think you have taken too much of this medicine contact a poison control center or emergency room at once. NOTE:  This medicine is only for you. Do not share this medicine with others. What if I miss a dose? If you miss a dose, take it as soon as you can. If it is almost time for your next dose, take only that dose. Do not take double or extra doses. What may interact with this medicine? Do not take this medicine with any of the following medications: -epoetin alfa This list may not describe all possible interactions. Give your health care provider a list of all the medicines, herbs, non-prescription drugs, or dietary supplements you use. Also tell them if you smoke, drink alcohol, or use illegal drugs. Some items may interact with your medicine. What should I watch for while using this medicine? Visit your prescriber or health care professional for regular checks on your progress and for the needed blood tests and blood pressure measurements. It is especially important for the doctor to make sure your hemoglobin level is in the desired range, to limit the risk of potential side effects and to give you the best benefit. Keep all appointments for any recommended tests. Check your blood pressure as directed. Ask your doctor what your blood pressure should be and when you should contact him or her. As your body makes more red blood cells, you may need to take iron, folic acid, or vitamin B supplements. Ask your doctor or health care provider which products are right for you. If you have kidney disease continue dietary restrictions, even though this medication can make you feel better. Talk with your doctor or health care professional about the   foods you eat and the vitamins that you take. What side effects may I notice from receiving this medicine? Side effects that you should report to your doctor or health care professional as soon as possible: -allergic reactions like skin rash, itching or hives, swelling of the face, lips, or tongue -breathing problems -changes in vision -chest pain -confusion, trouble speaking  or understanding -feeling faint or lightheaded, falls -high blood pressure -muscle aches or pains -pain, swelling, warmth in the leg -rapid weight gain -severe headaches -sudden numbness or weakness of the face, arm or leg -trouble walking, dizziness, loss of balance or coordination -seizures (convulsions) -swelling of the ankles, feet, hands -unusually weak or tired Side effects that usually do not require medical attention (report to your doctor or health care professional if they continue or are bothersome): -diarrhea -fever, chills (flu-like symptoms) -headaches -nausea, vomiting -redness, stinging, or swelling at site where injected This list may not describe all possible side effects. Call your doctor for medical advice about side effects. You may report side effects to FDA at 1-800-FDA-1088. Where should I keep my medicine? Keep out of the reach of children. Store in a refrigerator between 2 and 8 degrees C (36 and 46 degrees F). Do not freeze. Do not shake. Throw away any unused portion if using a single-dose vial. Throw away any unused medicine after the expiration date. NOTE: This sheet is a summary. It may not cover all possible information. If you have questions about this medicine, talk to your doctor, pharmacist, or health care provider.  2012, [employer]vier/[employer] Standard. (09/05/2008 10:23:57 AM) 

## 2012-03-18 ENCOUNTER — Other Ambulatory Visit: Payer: Self-pay | Admitting: Hematology & Oncology

## 2012-03-30 ENCOUNTER — Ambulatory Visit: Payer: Medicare Other

## 2012-03-30 ENCOUNTER — Other Ambulatory Visit: Payer: Medicare Other | Admitting: Lab

## 2012-03-30 ENCOUNTER — Ambulatory Visit: Payer: Medicare Other | Admitting: Hematology & Oncology

## 2012-04-13 ENCOUNTER — Ambulatory Visit (HOSPITAL_BASED_OUTPATIENT_CLINIC_OR_DEPARTMENT_OTHER): Payer: Medicare Other | Admitting: Hematology & Oncology

## 2012-04-13 ENCOUNTER — Other Ambulatory Visit (HOSPITAL_BASED_OUTPATIENT_CLINIC_OR_DEPARTMENT_OTHER): Payer: Medicare Other | Admitting: Lab

## 2012-04-13 ENCOUNTER — Ambulatory Visit: Payer: Medicare Other

## 2012-04-13 VITALS — BP 101/56 | HR 70 | Temp 97.1°F | Ht 68.0 in | Wt 204.0 lb

## 2012-04-13 DIAGNOSIS — N189 Chronic kidney disease, unspecified: Secondary | ICD-10-CM

## 2012-04-13 DIAGNOSIS — D4620 Refractory anemia with excess of blasts, unspecified: Secondary | ICD-10-CM

## 2012-04-13 DIAGNOSIS — D469 Myelodysplastic syndrome, unspecified: Secondary | ICD-10-CM

## 2012-04-13 DIAGNOSIS — D649 Anemia, unspecified: Secondary | ICD-10-CM

## 2012-04-13 DIAGNOSIS — D631 Anemia in chronic kidney disease: Secondary | ICD-10-CM

## 2012-04-13 DIAGNOSIS — D696 Thrombocytopenia, unspecified: Secondary | ICD-10-CM

## 2012-04-13 DIAGNOSIS — D509 Iron deficiency anemia, unspecified: Secondary | ICD-10-CM

## 2012-04-13 DIAGNOSIS — N289 Disorder of kidney and ureter, unspecified: Secondary | ICD-10-CM

## 2012-04-13 LAB — CBC WITH DIFFERENTIAL (CANCER CENTER ONLY)
HCT: 30.6 % — ABNORMAL LOW (ref 38.7–49.9)
MCH: 34.6 pg — ABNORMAL HIGH (ref 28.0–33.4)
MCV: 107 fL — ABNORMAL HIGH (ref 82–98)
RDW: 16.7 % — ABNORMAL HIGH (ref 11.1–15.7)
WBC: 5.7 10*3/uL (ref 4.0–10.0)

## 2012-04-13 LAB — MANUAL DIFFERENTIAL (CHCC SATELLITE)
ALC: 3.4 10*3/uL — ABNORMAL HIGH (ref 0.9–3.3)
MONO: 16 % — ABNORMAL HIGH (ref 0–13)
PLT EST ~~LOC~~: DECREASED
SEG: 21 % — ABNORMAL LOW (ref 40–75)

## 2012-04-13 LAB — RETICULOCYTES (CHCC): RBC.: 2.93 MIL/uL — ABNORMAL LOW (ref 4.22–5.81)

## 2012-04-13 LAB — IRON AND TIBC
Iron: 92 ug/dL (ref 42–165)
UIBC: 186 ug/dL (ref 125–400)

## 2012-04-13 NOTE — Progress Notes (Signed)
This office note has been dictated.

## 2012-04-29 NOTE — Progress Notes (Signed)
CC:   [name]. [name], M.D. Richard F. Caryn Section, M.D.  DIAGNOSES: 1. Anemia of renal insufficiency. 2. Refractory anemia with multilineage dysplasia.  CURRENT THERAPY:  Aranesp 300 mcg subcu as needed for hemoglobin less than 11.  INTERIM HISTORY:  [name] comes in for followup.  He is doing okay.  He has had some fatigue.  He has had no obvious bleeding.  He has had no nausea and vomiting.  His appetite has been okay.  He has had some leg swelling.  His last iron was given I think back in February of this year.  He gets Aranesp every 2 or 3 months.  PHYSICAL EXAMINATION:  This is an elderly white gentleman in no obvious distress.  Vital signs:  97.1, pulse 70, respiratory rate 22, blood pressure 101/57.  Weight is 204.  Head and neck:  Normocephalic, atraumatic skull.  There are no ocular or oral lesions.  There are no palpable cervical or supraclavicular lymph nodes.  Lungs:  Clear bilaterally.  Cardiac:  Regular rate and rhythm with a normal S1 and S2. He has a 1/6 systolic ejection murmur.  Abdomen:  Soft with good bowel sounds.  There is no palpable abdominal mass.  There is no fluid wave. I cannot palpate his liver or spleen.  Back:  Some slight kyphosis.  He has no tenderness over the spine, ribs, or hips.  Extremities:  Some chronic 1+ edema in his lower legs.  He has decent range of motion of his joints.  He has osteoarthritic changes in his joints.  Skin: Numerous seborrheic keratoses and actinic keratoses.  LABORATORY STUDIES:  White cell count is 5.7, hemoglobin 9.9, hematocrit 30.6, platelet count 36,000.  MCV is 107.  Peripheral smear shows no blasts.  He does have no nucleated red blood cells.  I see no teardrop cells.  He has no rouleaux low formation.  He has no hypersegmented polys.  He has an increase in lymphocytes.  These appear to be mature.  Platelets are decreased in number.  I see no large platelets.  Platelets appear to be well-granulated.  IMPRESSION:   [name] is an 78 year old gentleman with multifactorial anemia.  I suspect that his main problem is going to be myelodysplasia. He has the refractory anemia with multilineage dysplasia.  He has a marked elevated MCV.  We are going to have to watch his platelets closely.  I want to make sure that does not continue to trend downward.  I would hate to do a bone marrow test on him.  He is 78 years old.  I think if we find anything difficult, we would not be able to treat aggressively because of his overall performance status (ECOG 2-3).  Of note, his iron studies show a ferritin of 357 with an iron saturation of 33%.  I want to get him back to see [place] in about 3 weeks' time.  He will hold on Aranesp today.  Of note, his last iron was actually given back in May.    ______________________________ [name], M.D. PRE/MEDQ  D:  04/28/2012  T:  04/29/2012  Job:  [id number]

## 2012-05-04 ENCOUNTER — Ambulatory Visit (HOSPITAL_BASED_OUTPATIENT_CLINIC_OR_DEPARTMENT_OTHER): Payer: Medicare Other | Admitting: Hematology & Oncology

## 2012-05-04 ENCOUNTER — Other Ambulatory Visit (HOSPITAL_BASED_OUTPATIENT_CLINIC_OR_DEPARTMENT_OTHER): Payer: Medicare Other | Admitting: Lab

## 2012-05-04 VITALS — BP 118/60 | HR 75 | Temp 97.1°F | Ht 68.0 in | Wt 202.0 lb

## 2012-05-04 DIAGNOSIS — D469 Myelodysplastic syndrome, unspecified: Secondary | ICD-10-CM

## 2012-05-04 DIAGNOSIS — N289 Disorder of kidney and ureter, unspecified: Secondary | ICD-10-CM

## 2012-05-04 DIAGNOSIS — D649 Anemia, unspecified: Secondary | ICD-10-CM

## 2012-05-04 DIAGNOSIS — D4620 Refractory anemia with excess of blasts, unspecified: Secondary | ICD-10-CM

## 2012-05-04 DIAGNOSIS — D509 Iron deficiency anemia, unspecified: Secondary | ICD-10-CM

## 2012-05-04 DIAGNOSIS — N189 Chronic kidney disease, unspecified: Secondary | ICD-10-CM

## 2012-05-04 DIAGNOSIS — D631 Anemia in chronic kidney disease: Secondary | ICD-10-CM

## 2012-05-04 DIAGNOSIS — D46Z Other myelodysplastic syndromes: Secondary | ICD-10-CM

## 2012-05-04 DIAGNOSIS — D696 Thrombocytopenia, unspecified: Secondary | ICD-10-CM

## 2012-05-04 LAB — CBC WITH DIFFERENTIAL (CANCER CENTER ONLY)
HCT: 31.6 % — ABNORMAL LOW (ref 38.7–49.9)
HGB: 10.3 g/dL — ABNORMAL LOW (ref 13.0–17.1)
MCH: 34.6 pg — ABNORMAL HIGH (ref 28.0–33.4)
MCHC: 32.6 g/dL (ref 32.0–35.9)
MCV: 106 fL — ABNORMAL HIGH (ref 82–98)
RDW: 16.2 % — ABNORMAL HIGH (ref 11.1–15.7)

## 2012-05-04 LAB — MANUAL DIFFERENTIAL (CHCC SATELLITE)
ANC (CHCC HP manual diff): 0.8 10*3/uL — ABNORMAL LOW (ref 1.5–6.5)
Band Neutrophils: 1 % (ref 0–10)

## 2012-05-04 LAB — FERRITIN: Ferritin: 334 ng/mL — ABNORMAL HIGH (ref 22–322)

## 2012-05-04 LAB — RETICULOCYTES (CHCC)
ABS Retic: 24.4 10*3/uL (ref 19.0–186.0)
Retic Ct Pct: 0.8 % (ref 0.4–2.3)

## 2012-05-04 LAB — IRON AND TIBC: %SAT: 37 % (ref 20–55)

## 2012-05-04 NOTE — Progress Notes (Signed)
CC:   [name]. [name], M.D. Richard F. Caryn Section, M.D.  DIAGNOSES: 1. Anemia of renal insufficiency. 2. Refractory anemia with multilineage dysplasia.  CURRENT THERAPY:  Aranesp 300 mcg subcu as needed for hemoglobin less than 10.  INTERIM HISTORY:  Mr. Wayson comes in for followup.  He is feeling better. He has a little bit more energy.  When he was last here I think back in early June, he did get Aranesp.  His last iron was given back in May.  He has had no problems with bleeding.  His appetite has been good.  He has had no pain problems.  He is on Coumadin.  He does bruise quite a bit.  PHYSICAL EXAMINATION:  General:  This is an elderly white gentleman in no obvious distress.  Vital signs:  97.1, pulse 75, respiratory rate 20, blood pressure 118/60.  Weight is 202.  Head and neck:  Shows a normocephalic, atraumatic skull.  There are no ocular or oral lesions. There are no palpable cervical or supraclavicular lymph nodes.  Lungs: Clear bilaterally.  Cardiac:  Regular rate and rhythm with occasional extra beat.  He has a 1/6 systolic ejection murmur.  Abdomen:  Soft with good bowel sounds.  There is no palpable abdominal mass.  There is no fluid wave.  There is no palpable hepatosplenomegaly.  Extremities: Shows no clubbing, cyanosis or edema.  He has some osteoarthritic changes in his joints.  Neurological:  Shows no focal neurological deficits.  Skin:  Exam does show some ecchymoses on extremities.  LABORATORY STUDIES:  White cell count is 6.1, hemoglobin 10.3, hematocrit 31.6, platelet count 40,000.  IMPRESSION:  Mr. Stebner is an 78 year old gentleman with multifactorial anemia.  He does have refractory anemia with multilineage dysplasia.  He also has some renal insufficiency.  Being on Coumadin sounds like it is contributing to some bleeding.  I think for today we can hold off on giving him any Aranesp.  I think we can just follow him along.  He is not too keen on coming back  too often.  I think we can probably get him back after Labor Day now.  I think he should be okay until September.   ______________________________ Josph Macho, M.D. PRE/MEDQ  D:  05/04/2012  T:  05/04/2012  Job:  [id number]

## 2012-05-04 NOTE — Progress Notes (Signed)
This office note has been dictated.

## 2012-05-06 ENCOUNTER — Encounter: Payer: Self-pay | Admitting: Internal Medicine

## 2012-05-06 ENCOUNTER — Ambulatory Visit (INDEPENDENT_AMBULATORY_CARE_PROVIDER_SITE_OTHER): Payer: Medicare Other | Admitting: *Deleted

## 2012-05-06 DIAGNOSIS — I495 Sick sinus syndrome: Secondary | ICD-10-CM

## 2012-05-06 DIAGNOSIS — Z950 Presence of cardiac pacemaker: Secondary | ICD-10-CM

## 2012-05-07 LAB — REMOTE PACEMAKER DEVICE
ATRIAL PACING PM: 3.6
BAMS-0001: 150 {beats}/min
LV LEAD THRESHOLD: 1.625 V
RV LEAD IMPEDENCE PM: 580 Ohm
VENTRICULAR PACING PM: 100

## 2012-06-02 ENCOUNTER — Encounter: Payer: Self-pay | Admitting: *Deleted

## 2012-07-06 ENCOUNTER — Ambulatory Visit (HOSPITAL_BASED_OUTPATIENT_CLINIC_OR_DEPARTMENT_OTHER): Payer: Medicare Other | Admitting: Lab

## 2012-07-06 ENCOUNTER — Ambulatory Visit (HOSPITAL_BASED_OUTPATIENT_CLINIC_OR_DEPARTMENT_OTHER): Payer: Medicare Other | Admitting: Medical

## 2012-07-06 ENCOUNTER — Ambulatory Visit: Payer: Medicare Other

## 2012-07-06 VITALS — BP 132/66 | HR 60 | Temp 97.5°F | Resp 20 | Ht 68.0 in | Wt 201.0 lb

## 2012-07-06 DIAGNOSIS — D649 Anemia, unspecified: Secondary | ICD-10-CM

## 2012-07-06 DIAGNOSIS — D46Z Other myelodysplastic syndromes: Secondary | ICD-10-CM

## 2012-07-06 DIAGNOSIS — D4620 Refractory anemia with excess of blasts, unspecified: Secondary | ICD-10-CM

## 2012-07-06 DIAGNOSIS — D509 Iron deficiency anemia, unspecified: Secondary | ICD-10-CM

## 2012-07-06 DIAGNOSIS — N189 Chronic kidney disease, unspecified: Secondary | ICD-10-CM

## 2012-07-06 DIAGNOSIS — N289 Disorder of kidney and ureter, unspecified: Secondary | ICD-10-CM

## 2012-07-06 DIAGNOSIS — D631 Anemia in chronic kidney disease: Secondary | ICD-10-CM

## 2012-07-06 LAB — MANUAL DIFFERENTIAL (CHCC SATELLITE)
ANC (CHCC HP manual diff): 1.6 10*3/uL (ref 1.5–6.5)
Band Neutrophils: 2 % (ref 0–10)
Myelocytes: 1 % — ABNORMAL HIGH (ref 0–0)
PLT EST ~~LOC~~: DECREASED

## 2012-07-06 LAB — IRON AND TIBC
%SAT: 30 % (ref 20–55)
Iron: 70 ug/dL (ref 42–165)
TIBC: 233 ug/dL (ref 215–435)
UIBC: 163 ug/dL (ref 125–400)

## 2012-07-06 LAB — CBC WITH DIFFERENTIAL (CANCER CENTER ONLY)
Platelets: 46 10*3/uL — ABNORMAL LOW (ref 145–400)
RBC: 2.52 10*6/uL — ABNORMAL LOW (ref 4.20–5.70)
WBC: 6 10*3/uL (ref 4.0–10.0)

## 2012-07-06 MED ORDER — DARBEPOETIN ALFA-POLYSORBATE 300 MCG/0.6ML IJ SOLN
300.0000 ug | Freq: Once | INTRAMUSCULAR | Status: AC
  Administered 2012-07-06: 300 ug via SUBCUTANEOUS

## 2012-07-06 NOTE — Progress Notes (Signed)
Diagnoses: #1 anemia of renal insufficiency. #2 refractory anemia, with multi-lineage dysplasia.  Current therapy: Aranesp 300 mcg subcutaneous as needed.  For hemoglobin less than 10.  Interim history: [name] presents today for an office followup visit.  Overall, he, reports, that he's been doing relatively well.  He remains on Coumadin and does bruise quite easily.  He denies any obvious, or abnormal bleeding.  He, reports, he has a good appetite.  He denies any nausea, vomiting, diarrhea, constipation.  He denies any problems with pain.  He denies any cough, chest pain, or shortness of breath.  He denies any fevers, chills, or night sweats.  He denies any headaches, visual changes, or rashes.  He denies any abnormal, weakness, or fatigue.  It looks like he had some IV iron.  Back on May 14, and his last Aranesp injection was 03/16/2012.  His hemoglobin is on the lower side.  Today at 8.8.  As such, we will go ahead and give him an Aranesp injection.  We are also checking his iron panel.  Review of Systems: Pt. Denies any changes in their vision, hearing, adenopathy, fevers, chills, nausea, vomiting, diarrhea, constipation, chest pain, shortness of breath, passing blood, passing out, blacking out,  any changes in skin, joints, neurologic or psychiatric except as noted.  Physical Exam: This is an 78 year old, elderly, white gentleman, in no obvious distress Vitals: Temperature 97.5 degrees, pulse 60, respirations 20, blood pressure 132/66, weight 201 pounds HEENT reveals a normocephalic, atraumatic skull, no scleral icterus, no oral lesions  Neck is supple without any cervical or supraclavicular adenopathy.  Lungs are clear to auscultation bilaterally. There are no wheezes, rales or rhonci Cardiac is regular rate and rhythm with a normal S1 and S2. There are no murmurs, rubs, or bruits.  Abdomen is soft with good bowel sounds, there is no palpable mass. There is no palpable hepatosplenomegaly.  There is no palpable fluid wave.  Musculoskeletal no tenderness of the spine, ribs, or hips.  Extremities there are no clubbing, cyanosis, or edema.  Skin no petechia, purpura or ecchymosis Neurologic is nonfocal.  Laboratory Data: White count 6.0, hemoglobin 8.8, hematocrit 27.4, platelets 46,000  Current Outpatient Prescriptions on File Prior to Visit  Medication Sig Dispense Refill  . carvedilol (COREG) 25 MG tablet Take 25 mg by mouth once.      . furosemide (LASIX) 20 MG tablet Take 1 tablet (20 mg total) by mouth daily.  30 tablet  6  . metoCLOPramide (REGLAN) 5 MG tablet Take 5 mg by mouth at bedtime as needed.      . mirtazapine (REMERON) 15 MG tablet Take 15 mg by mouth at bedtime.      Marland Kitchen omeprazole (PRILOSEC) 20 MG capsule Take 20 mg by mouth daily.       . simvastatin (ZOCOR) 40 MG tablet Take 40 mg by mouth at bedtime.        . Tamsulosin HCl (FLOMAX) 0.4 MG CAPS Take 0.4 mg by mouth daily.        . vitamin B-12 (CYANOCOBALAMIN) 1000 MCG tablet Take 1,000 mcg by mouth daily.      Marland Kitchen warfarin (COUMADIN) 5 MG tablet Take 5 mg by mouth daily.        Assessment/Plan: This is a pleasant, 78 year old, elderly, white gentleman, with the following issues:  #1 multifactorial anemia.  He does have refractory anemia, with multi-lineage dysplasia.  He, also has some renal insufficiency.  His hemoglobin is on the lower side.  Today at 8.8.  We will go ahead and give him an Aranesp injection 300 mg subcutaneous.  We are also checking his iron studies and if he requires IV iron.  We will give him a call.  #2 followup.  We will follow back up with [name] in 2 months, but before then should there be questions or concerns.

## 2012-07-06 NOTE — Patient Instructions (Signed)
Darbepoetin Alfa injection What is this medicine? DARBEPOETIN ALFA (dar be POE e tin AL fa) helps your body make more red blood cells. It is used to treat anemia caused by chronic kidney failure and chemotherapy. This medicine may be used for other purposes; ask your health care provider or pharmacist if you have questions. What should I tell my health care provider before I take this medicine? They need to know if you have any of these conditions: -blood clotting disorders or history of blood clots -cancer patient not on chemotherapy -cystic fibrosis -heart disease, such as angina, heart failure, or a history of a heart attack -hemoglobin level of 12 g/dL or greater -high blood pressure -low levels of folate, iron, or vitamin B12 -seizures -an unusual or allergic reaction to darbepoetin, erythropoietin, albumin, hamster proteins, latex, other medicines, foods, dyes, or preservatives -pregnant or trying to get pregnant -breast-feeding How should I use this medicine? This medicine is for injection into a vein or under the skin. It is usually given by a health care professional in a hospital or clinic setting. If you get this medicine at home, you will be taught how to prepare and give this medicine. Do not shake the solution before you withdraw a dose. Use exactly as directed. Take your medicine at regular intervals. Do not take your medicine more often than directed. It is important that you put your used needles and syringes in a special sharps container. Do not put them in a trash can. If you do not have a sharps container, call your pharmacist or healthcare provider to get one. Talk to your pediatrician regarding the use of this medicine in children. While this medicine may be used in children as young as 1 year for selected conditions, precautions do apply. Overdosage: If you think you have taken too much of this medicine contact a poison control center or emergency room at once. NOTE:  This medicine is only for you. Do not share this medicine with others. What if I miss a dose? If you miss a dose, take it as soon as you can. If it is almost time for your next dose, take only that dose. Do not take double or extra doses. What may interact with this medicine? Do not take this medicine with any of the following medications: -epoetin alfa This list may not describe all possible interactions. Give your health care provider a list of all the medicines, herbs, non-prescription drugs, or dietary supplements you use. Also tell them if you smoke, drink alcohol, or use illegal drugs. Some items may interact with your medicine. What should I watch for while using this medicine? Visit your prescriber or health care professional for regular checks on your progress and for the needed blood tests and blood pressure measurements. It is especially important for the doctor to make sure your hemoglobin level is in the desired range, to limit the risk of potential side effects and to give you the best benefit. Keep all appointments for any recommended tests. Check your blood pressure as directed. Ask your doctor what your blood pressure should be and when you should contact him or her. As your body makes more red blood cells, you may need to take iron, folic acid, or vitamin B supplements. Ask your doctor or health care provider which products are right for you. If you have kidney disease continue dietary restrictions, even though this medication can make you feel better. Talk with your doctor or health care professional about the   foods you eat and the vitamins that you take. What side effects may I notice from receiving this medicine? Side effects that you should report to your doctor or health care professional as soon as possible: -allergic reactions like skin rash, itching or hives, swelling of the face, lips, or tongue -breathing problems -changes in vision -chest pain -confusion, trouble speaking  or understanding -feeling faint or lightheaded, falls -high blood pressure -muscle aches or pains -pain, swelling, warmth in the leg -rapid weight gain -severe headaches -sudden numbness or weakness of the face, arm or leg -trouble walking, dizziness, loss of balance or coordination -seizures (convulsions) -swelling of the ankles, feet, hands -unusually weak or tired Side effects that usually do not require medical attention (report to your doctor or health care professional if they continue or are bothersome): -diarrhea -fever, chills (flu-like symptoms) -headaches -nausea, vomiting -redness, stinging, or swelling at site where injected This list may not describe all possible side effects. Call your doctor for medical advice about side effects. You may report side effects to FDA at 1-800-FDA-1088. Where should I keep my medicine? Keep out of the reach of children. Store in a refrigerator between 2 and 8 degrees C (36 and 46 degrees F). Do not freeze. Do not shake. Throw away any unused portion if using a single-dose vial. Throw away any unused medicine after the expiration date. NOTE: This sheet is a summary. It may not cover all possible information. If you have questions about this medicine, talk to your doctor, pharmacist, or health care provider.  2012, [employer]vier/[employer] Standard. (09/05/2008 10:23:57 AM) 

## 2012-07-21 ENCOUNTER — Encounter: Payer: Self-pay | Admitting: Internal Medicine

## 2012-07-21 ENCOUNTER — Ambulatory Visit (INDEPENDENT_AMBULATORY_CARE_PROVIDER_SITE_OTHER): Payer: Medicare Other | Admitting: Internal Medicine

## 2012-07-21 VITALS — BP 117/58 | HR 78 | Ht 70.0 in | Wt 201.2 lb

## 2012-07-21 DIAGNOSIS — I10 Essential (primary) hypertension: Secondary | ICD-10-CM

## 2012-07-21 DIAGNOSIS — Z950 Presence of cardiac pacemaker: Secondary | ICD-10-CM

## 2012-07-21 DIAGNOSIS — I359 Nonrheumatic aortic valve disorder, unspecified: Secondary | ICD-10-CM

## 2012-07-21 DIAGNOSIS — I4891 Unspecified atrial fibrillation: Secondary | ICD-10-CM

## 2012-07-21 DIAGNOSIS — Z954 Presence of other heart-valve replacement: Secondary | ICD-10-CM

## 2012-07-21 DIAGNOSIS — I2510 Atherosclerotic heart disease of native coronary artery without angina pectoris: Secondary | ICD-10-CM

## 2012-07-21 LAB — PACEMAKER DEVICE OBSERVATION
AL IMPEDENCE PM: 325 Ohm
ATRIAL PACING PM: 4.9
BAMS-0001: 150 {beats}/min
BAMS-0003: 70 {beats}/min
BATTERY VOLTAGE: 2.9328 V
DEVICE MODEL PM: 2317387
RV LEAD IMPEDENCE PM: 562.5 Ohm

## 2012-07-21 NOTE — Assessment & Plan Note (Signed)
Well controlled at present

## 2012-07-21 NOTE — Progress Notes (Signed)
Patient Care Team: Londell Moh, MD as PCP - General (Internal Medicine)   HPI  [name] is a 78 y.o. male is seen following CRT P. implantation for symptomatic bradycardia in this state of ischemic heart disease prior bypass surgery status post bioprosthetic aortic valve and ejection fraction of 40%.  He has now persistent atrial fibrillation  The patient denies chest pain, shortness of breath, nocturnal dyspnea, orthopnea or peripheral edema.  There have been no palpitations, lightheadedness or syncope.    He is being treated for anemia attributable to renal insufficiency and some kind of dysplastic syndrome   Review of prior data demonstrates that his echo cardiogram 2012l demonstrated normal left ventricular function Statin therapy is followed by Dr. [name]   Past Medical History  Diagnosis Date  . Atrial fibrillation     Paroxysmal, on Coumadin  . CAD (coronary artery disease)     CABGx6 10/2006  . Aortic stenosis     23-mm [employer] Biocor porcine bioprosthetic at time of CABG 10/2006  . Hypertension   . Colon cancer     s/p colon resection  . Prostate cancer   . GERD (gastroesophageal reflux disease)   . Ischemic cardiomyopathy     EF 40% previously s/p BiV [employer] PPM. Most recent echo 08/2009 - EF could not be estimated but appeared to be low-normal.  . Shortness of breath   . Anemia   . Blood transfusion     no hx of reaction to transfusions"  . Anemia, iron deficiency 11/20/2011  . Anemia of renal disease 11/20/2011  . Thrombocytopenia 11/20/2011    Past Surgical History  Procedure Date  . Insert / replace / remove pacemaker     biventricular, [employer]  . Coronary artery bypass graft   . Aortic valve replacement   . Left eye cataract extraction   . Appendectomy   . Hemicolectomy 2000    right  . Cardiac surgery     Current Outpatient Prescriptions  Medication Sig Dispense Refill  . carvedilol (COREG) 25 MG tablet Take 25 mg by mouth once.       . furosemide (LASIX) 20 MG tablet Take 1 tablet (20 mg total) by mouth daily.  30 tablet  6  . metoCLOPramide (REGLAN) 5 MG tablet Take 5 mg by mouth at bedtime as needed.      . mirtazapine (REMERON) 15 MG tablet Take 15 mg by mouth at bedtime.      Marland Kitchen omeprazole (PRILOSEC) 20 MG capsule Take 20 mg by mouth daily.       . simvastatin (ZOCOR) 40 MG tablet Take 40 mg by mouth at bedtime.        . Tamsulosin HCl (FLOMAX) 0.4 MG CAPS Take 0.4 mg by mouth daily.        . vitamin B-12 (CYANOCOBALAMIN) 1000 MCG tablet Take 1,000 mcg by mouth daily.      Marland Kitchen warfarin (COUMADIN) 5 MG tablet Take 5 mg by mouth daily.         No Known Allergies  Review of Systems negative except from HPI and PMH  Physical Exam BP 117/58  Pulse 78  Ht 5\' 10"  (1.778 m)  Wt 201 lb 3.2 oz (91.264 kg)  BMI 28.87 kg/m2 Well developed and well nourished in no acute distress HENT normal E scleral and icterus clear Neck Supple JVP flat; carotids brisk and full Clear to ausculation Regular rate and rhythm, no murmurs gallops or rub Soft with  active bowel sounds No clubbing cyanosis Trace Edema Alert and oriented, grossly normal motor and sensory function Skin Warm and Dry    Assessment and  Plan

## 2012-07-21 NOTE — Assessment & Plan Note (Signed)
Clinically stable. 

## 2012-07-21 NOTE — Patient Instructions (Addendum)
Remote monitoring is used to monitor your Pacemaker of ICD from home. This monitoring reduces the number of office visits required to check your device to one time per year. It allows [place] to keep an eye on the functioning of your device to ensure it is working properly. You are scheduled for a device check from home on October 26, 2011. You may send your transmission at any time that day. If you have a wireless device, the transmission will be sent automatically. After your physician reviews your transmission, you will receive a postcard with your next transmission date.   Your physician wants you to follow-up in: 1 year with Dr. Graciela Husbands.  You will receive a reminder letter in the mail two months in advance. If you don't receive a letter, please call our office to schedule the follow-up appointment.

## 2012-07-21 NOTE — Assessment & Plan Note (Signed)
Atrial fibrillation is permanent; he is on anticoagulation. We'll continue

## 2012-07-21 NOTE — Assessment & Plan Note (Signed)
The patient's device was interrogated.  The information was reviewed. No changes were made in the programming.    

## 2012-07-21 NOTE — Assessment & Plan Note (Signed)
No ischemic symptoms 

## 2012-09-07 ENCOUNTER — Other Ambulatory Visit: Payer: Self-pay | Admitting: Medical

## 2012-09-07 ENCOUNTER — Ambulatory Visit (HOSPITAL_BASED_OUTPATIENT_CLINIC_OR_DEPARTMENT_OTHER): Payer: Medicare Other | Admitting: Medical

## 2012-09-07 ENCOUNTER — Other Ambulatory Visit (HOSPITAL_BASED_OUTPATIENT_CLINIC_OR_DEPARTMENT_OTHER): Payer: Medicare Other | Admitting: Lab

## 2012-09-07 ENCOUNTER — Ambulatory Visit (HOSPITAL_BASED_OUTPATIENT_CLINIC_OR_DEPARTMENT_OTHER): Payer: Medicare Other

## 2012-09-07 ENCOUNTER — Ambulatory Visit: Payer: Medicare Other

## 2012-09-07 VITALS — BP 117/58 | HR 70 | Temp 97.6°F | Resp 20 | Ht 70.0 in | Wt 201.0 lb

## 2012-09-07 DIAGNOSIS — N289 Disorder of kidney and ureter, unspecified: Secondary | ICD-10-CM

## 2012-09-07 DIAGNOSIS — D631 Anemia in chronic kidney disease: Secondary | ICD-10-CM

## 2012-09-07 DIAGNOSIS — D4620 Refractory anemia with excess of blasts, unspecified: Secondary | ICD-10-CM

## 2012-09-07 DIAGNOSIS — D509 Iron deficiency anemia, unspecified: Secondary | ICD-10-CM

## 2012-09-07 DIAGNOSIS — D649 Anemia, unspecified: Secondary | ICD-10-CM

## 2012-09-07 LAB — IRON AND TIBC: %SAT: 34 % (ref 20–55)

## 2012-09-07 LAB — CBC WITH DIFFERENTIAL (CANCER CENTER ONLY)
HCT: 27.6 % — ABNORMAL LOW (ref 38.7–49.9)
HGB: 8.9 g/dL — ABNORMAL LOW (ref 13.0–17.1)
RBC: 2.55 10*6/uL — ABNORMAL LOW (ref 4.20–5.70)

## 2012-09-07 LAB — MANUAL DIFFERENTIAL (CHCC SATELLITE)
ANC (CHCC HP manual diff): 1.2 10*3/uL — ABNORMAL LOW (ref 1.5–6.5)
Band Neutrophils: 1 % (ref 0–10)
Myelocytes: 1 % — ABNORMAL HIGH (ref 0–0)
SEG: 16 % — ABNORMAL LOW (ref 40–75)

## 2012-09-07 LAB — FERRITIN: Ferritin: 254 ng/mL (ref 22–322)

## 2012-09-07 LAB — RETICULOCYTES (CHCC)
ABS Retic: 39.5 10*3/uL (ref 19.0–186.0)
RBC.: 2.63 MIL/uL — ABNORMAL LOW (ref 4.22–5.81)

## 2012-09-07 MED ORDER — SODIUM CHLORIDE 0.9 % IV SOLN
Freq: Once | INTRAVENOUS | Status: AC
  Administered 2012-09-07: 11:00:00 via INTRAVENOUS

## 2012-09-07 MED ORDER — DARBEPOETIN ALFA-POLYSORBATE 300 MCG/0.6ML IJ SOLN
300.0000 ug | Freq: Once | INTRAMUSCULAR | Status: AC
  Administered 2012-09-07: 300 ug via SUBCUTANEOUS

## 2012-09-07 MED ORDER — FERUMOXYTOL INJECTION 510 MG/17 ML
510.0000 mg | Freq: Once | INTRAVENOUS | Status: AC
  Administered 2012-09-07: 510 mg via INTRAVENOUS
  Filled 2012-09-07: qty 17

## 2012-09-07 NOTE — Patient Instructions (Signed)
Darbepoetin Alfa injection What is this medicine? DARBEPOETIN ALFA (dar be POE e tin AL fa) helps your body make more red blood cells. It is used to treat anemia caused by chronic kidney failure and chemotherapy. This medicine may be used for other purposes; ask your health care provider or pharmacist if you have questions. What should I tell my health care provider before I take this medicine? They need to know if you have any of these conditions: -blood clotting disorders or history of blood clots -cancer patient not on chemotherapy -cystic fibrosis -heart disease, such as angina, heart failure, or a history of a heart attack -hemoglobin level of 12 g/dL or greater -high blood pressure -low levels of folate, iron, or vitamin B12 -seizures -an unusual or allergic reaction to darbepoetin, erythropoietin, albumin, hamster proteins, latex, other medicines, foods, dyes, or preservatives -pregnant or trying to get pregnant -breast-feeding How should I use this medicine? This medicine is for injection into a vein or under the skin. It is usually given by a health care professional in a hospital or clinic setting. If you get this medicine at home, you will be taught how to prepare and give this medicine. Do not shake the solution before you withdraw a dose. Use exactly as directed. Take your medicine at regular intervals. Do not take your medicine more often than directed. It is important that you put your used needles and syringes in a special sharps container. Do not put them in a trash can. If you do not have a sharps container, call your pharmacist or healthcare provider to get one. Talk to your pediatrician regarding the use of this medicine in children. While this medicine may be used in children as young as 1 year for selected conditions, precautions do apply. Overdosage: If you think you have taken too much of this medicine contact a poison control center or emergency room at once. NOTE:  This medicine is only for you. Do not share this medicine with others. What if I miss a dose? If you miss a dose, take it as soon as you can. If it is almost time for your next dose, take only that dose. Do not take double or extra doses. What may interact with this medicine? Do not take this medicine with any of the following medications: -epoetin alfa This list may not describe all possible interactions. Give your health care provider a list of all the medicines, herbs, non-prescription drugs, or dietary supplements you use. Also tell them if you smoke, drink alcohol, or use illegal drugs. Some items may interact with your medicine. What should I watch for while using this medicine? Visit your prescriber or health care professional for regular checks on your progress and for the needed blood tests and blood pressure measurements. It is especially important for the doctor to make sure your hemoglobin level is in the desired range, to limit the risk of potential side effects and to give you the best benefit. Keep all appointments for any recommended tests. Check your blood pressure as directed. Ask your doctor what your blood pressure should be and when you should contact him or her. As your body makes more red blood cells, you may need to take iron, folic acid, or vitamin B supplements. Ask your doctor or health care provider which products are right for you. If you have kidney disease continue dietary restrictions, even though this medication can make you feel better. Talk with your doctor or health care professional about the   foods you eat and the vitamins that you take. What side effects may I notice from receiving this medicine? Side effects that you should report to your doctor or health care professional as soon as possible: -allergic reactions like skin rash, itching or hives, swelling of the face, lips, or tongue -breathing problems -changes in vision -chest pain -confusion, trouble speaking  or understanding -feeling faint or lightheaded, falls -high blood pressure -muscle aches or pains -pain, swelling, warmth in the leg -rapid weight gain -severe headaches -sudden numbness or weakness of the face, arm or leg -trouble walking, dizziness, loss of balance or coordination -seizures (convulsions) -swelling of the ankles, feet, hands -unusually weak or tired Side effects that usually do not require medical attention (report to your doctor or health care professional if they continue or are bothersome): -diarrhea -fever, chills (flu-like symptoms) -headaches -nausea, vomiting -redness, stinging, or swelling at site where injected This list may not describe all possible side effects. Call your doctor for medical advice about side effects. You may report side effects to FDA at 1-800-FDA-1088. Where should I keep my medicine? Keep out of the reach of children. Store in a refrigerator between 2 and 8 degrees C (36 and 46 degrees F). Do not freeze. Do not shake. Throw away any unused portion if using a single-dose vial. Throw away any unused medicine after the expiration date. NOTE: This sheet is a summary. It may not cover all possible information. If you have questions about this medicine, talk to your doctor, pharmacist, or health care provider.  2012, [employer]vier/Gold Standard. (09/05/2008 10:23:57 AM)Ferumoxytol injection What is this medicine? FERUMOXYTOL is an iron complex. Iron is used to make healthy red blood cells, which carry oxygen and nutrients throughout the body. This medicine is used to treat iron deficiency anemia in people with chronic kidney disease. This medicine may be used for other purposes; ask your health care provider or pharmacist if you have questions. What should I tell my health care provider before I take this medicine? They need to know if you have any of these conditions: -anemia not caused by low iron levels -high levels of iron in the blood -magnetic  resonance imaging (MRI) test scheduled -an unusual or allergic reaction to iron, other medicines, foods, dyes, or preservatives -pregnant or trying to get pregnant -breast-feeding How should I use this medicine? This medicine is for infusion into a vein. It is given by a health care professional in a hospital or clinic setting. Talk to your pediatrician regarding the use of this medicine in children. Special care may be needed. Overdosage: If you think you've taken too much of this medicine contact a poison control center or emergency room at once. Overdosage: If you think you have taken too much of this medicine contact a poison control center or emergency room at once. NOTE: This medicine is only for you. Do not share this medicine with others. What if I miss a dose? It is important not to miss your dose. Call your doctor or health care professional if you are unable to keep an appointment. What may interact with this medicine? This medicine may interact with the following medications: -other iron products This list may not describe all possible interactions. Give your health care provider a list of all the medicines, herbs, non-prescription drugs, or dietary supplements you use. Also tell them if you smoke, drink alcohol, or use illegal drugs. Some items may interact with your medicine. What should I watch for while using this   medicine? Visit your doctor or healthcare professional regularly. Tell your doctor or healthcare professional if your symptoms do not start to get better or if they get worse. You may need blood work done while you are taking this medicine. You may need to follow a special diet. Talk to your doctor. Foods that contain iron include: whole grains/cereals, dried fruits, beans, or peas, leafy green vegetables, and organ meats (liver, kidney). What side effects may I notice from receiving this medicine? Side effects that you should report to your doctor or health care  professional as soon as possible: -allergic reactions like skin rash, itching or hives, swelling of the face, lips, or tongue -breathing problems -changes in blood pressure -feeling faint or lightheaded, falls -fever or chills -flushing, sweating, or hot feelings -swelling of the ankles or feet Side effects that usually do not require medical attention (Report these to your doctor or health care professional if they continue or are bothersome.): -diarrhea -headache -nausea, vomiting -stomach pain This list may not describe all possible side effects. Call your doctor for medical advice about side effects. You may report side effects to FDA at 1-800-FDA-1088. Where should I keep my medicine? This drug is given in a hospital or clinic and will not be stored at home. NOTE: This sheet is a summary. It may not cover all possible information. If you have questions about this medicine, talk to your doctor, pharmacist, or health care provider.  2012, [employer]vier/Gold Standard. (06/14/2008 9:48:25 PM) 

## 2012-09-07 NOTE — Progress Notes (Signed)
Diagnoses: #1 anemia of renal insufficiency. #2 refractory anemia, with multi-lineage dysplasia. #3 intermittent iron deficiency anemia.  Last dose of IV iron was back on May, 2013.  Current therapy: Aranesp 300 mcg subcutaneous as needed.  For hemoglobin less than 10.  Interim history: Mr. [name] presents today for an office followup visit.  Overall, he, reports, that he's been doing relatively well except for some fatigue.  He does report some dyspnea on exertion.  He remains on Coumadin and does bruise quite easily.  He denies any obvious, or abnormal bleeding.  He, reports, he has a good appetite.  He denies any nausea, vomiting, diarrhea, constipation.  He denies any problems with pain.  He denies any cough, chest pain, or shortness of breath.  He denies any fevers, chills, or night sweats.  He denies any headaches, visual changes, or rashes.  He denies any abnormal, weakness, or fatigue.  It looks like he had some IV iron.  Back on May 14, and his last Aranesp injection was 07/06/2012.  His hemoglobin is on the lower side today at 8.9, as such, we will go ahead and give him an Aranesp injection.  We are also checking his iron panel.   Review of Systems: Pt. Denies any changes in their vision, hearing, adenopathy, fevers, chills, nausea, vomiting, diarrhea, constipation, chest pain, shortness of breath, passing blood, passing out, blacking out,  any changes in skin, joints, neurologic or psychiatric except as noted.  Physical Exam: This is an 78 year old, elderly, white gentleman, in no obvious distress Vitals: Temperature 97.6 degrees, pulse 70, respirations 20, blood pressure 117/58, weight 201 pounds HEENT reveals a normocephalic, atraumatic skull, no scleral icterus, no oral lesions  Neck is supple without any cervical or supraclavicular adenopathy.  Lungs are clear to auscultation bilaterally. There are no wheezes, rales or rhonci Cardiac is regular rate and rhythm with a normal S1 and S2.  There are no murmurs, rubs, or bruits.  Abdomen is soft with good bowel sounds, there is no palpable mass. There is no palpable hepatosplenomegaly. There is no palpable fluid wave.  Musculoskeletal no tenderness of the spine, ribs, or hips.  Extremities there are no clubbing, cyanosis, or edema.  Skin no petechia, purpura or ecchymosis Neurologic is nonfocal.  Laboratory Data: White count 6.4, hemoglobin 8.9, hematocrit 27.6, platelets 41,000  Current Outpatient Prescriptions on File Prior to Visit  Medication Sig Dispense Refill  . carvedilol (COREG) 25 MG tablet Take 25 mg by mouth once.      . furosemide (LASIX) 20 MG tablet Take 1 tablet (20 mg total) by mouth daily.  30 tablet  6  . metoCLOPramide (REGLAN) 5 MG tablet Take 5 mg by mouth at bedtime as needed.      . mirtazapine (REMERON) 15 MG tablet Take 15 mg by mouth at bedtime.      Marland Kitchen omeprazole (PRILOSEC) 20 MG capsule Take 20 mg by mouth daily.       . simvastatin (ZOCOR) 40 MG tablet Take 40 mg by mouth at bedtime.        . Tamsulosin HCl (FLOMAX) 0.4 MG CAPS Take 0.4 mg by mouth daily.        . vitamin B-12 (CYANOCOBALAMIN) 1000 MCG tablet Take 1,000 mcg by mouth daily.      Marland Kitchen warfarin (COUMADIN) 5 MG tablet Take 5 mg by mouth daily.        Assessment/Plan: This is a pleasant, 78 year old, elderly, white gentleman, with the following issues:  #1  multifactorial anemia.  He does have refractory anemia, with multi-lineage dysplasia.  He, also has some renal insufficiency.  His hemoglobin is on the lower side at 8.9.  We will go ahead and give him an Aranesp injection 300 mg subcutaneous.   #2.  Intermittent iron deficiency.  His last dose of IV iron was back in may.  He may very well need IV iron. If he requires IV iron, we will give him a call.  #2 followup.  We will follow back up with [name] in 2 months, but before then should there be questions or concerns.

## 2012-10-01 ENCOUNTER — Other Ambulatory Visit: Payer: Self-pay

## 2012-10-01 MED ORDER — FUROSEMIDE 20 MG PO TABS: 20.0000 mg | ORAL_TABLET | Freq: Every day | ORAL | Status: DC

## 2012-10-25 ENCOUNTER — Ambulatory Visit (INDEPENDENT_AMBULATORY_CARE_PROVIDER_SITE_OTHER): Payer: Medicare Other | Admitting: *Deleted

## 2012-10-25 ENCOUNTER — Encounter: Payer: Self-pay | Admitting: Internal Medicine

## 2012-10-25 DIAGNOSIS — Z950 Presence of cardiac pacemaker: Secondary | ICD-10-CM

## 2012-10-25 DIAGNOSIS — I495 Sick sinus syndrome: Secondary | ICD-10-CM

## 2012-10-25 LAB — REMOTE PACEMAKER DEVICE
AL IMPEDENCE PM: 300 Ohm
ATRIAL PACING PM: 1
DEVICE MODEL PM: 2317387
LV LEAD IMPEDENCE PM: 750 Ohm
LV LEAD THRESHOLD: 1.5 V
RV LEAD IMPEDENCE PM: 540 Ohm
RV LEAD THRESHOLD: 0.625 V

## 2012-11-02 ENCOUNTER — Encounter: Payer: Self-pay | Admitting: *Deleted

## 2012-11-08 ENCOUNTER — Other Ambulatory Visit: Payer: Self-pay | Admitting: Hematology & Oncology

## 2012-11-08 ENCOUNTER — Other Ambulatory Visit (HOSPITAL_BASED_OUTPATIENT_CLINIC_OR_DEPARTMENT_OTHER): Payer: Medicare Other | Admitting: Lab

## 2012-11-08 ENCOUNTER — Other Ambulatory Visit (HOSPITAL_COMMUNITY)
Admission: RE | Admit: 2012-11-08 | Discharge: 2012-11-08 | Disposition: A | Discharge status: Home / Self Care | Payer: Medicare Other | Source: Ambulatory Visit | Attending: Hematology & Oncology | Admitting: Hematology & Oncology

## 2012-11-08 ENCOUNTER — Ambulatory Visit (HOSPITAL_BASED_OUTPATIENT_CLINIC_OR_DEPARTMENT_OTHER): Payer: Medicare Other | Admitting: Hematology & Oncology

## 2012-11-08 ENCOUNTER — Ambulatory Visit: Payer: Medicare Other

## 2012-11-08 VITALS — BP 115/56 | HR 69 | Temp 97.5°F | Resp 18 | Ht 70.0 in | Wt 207.0 lb

## 2012-11-08 DIAGNOSIS — D696 Thrombocytopenia, unspecified: Secondary | ICD-10-CM

## 2012-11-08 DIAGNOSIS — D509 Iron deficiency anemia, unspecified: Secondary | ICD-10-CM

## 2012-11-08 DIAGNOSIS — C9140 Hairy cell leukemia not having achieved remission: Secondary | ICD-10-CM | POA: Insufficient documentation

## 2012-11-08 DIAGNOSIS — D4620 Refractory anemia with excess of blasts, unspecified: Secondary | ICD-10-CM

## 2012-11-08 DIAGNOSIS — D631 Anemia in chronic kidney disease: Secondary | ICD-10-CM

## 2012-11-08 LAB — MANUAL DIFFERENTIAL (CHCC SATELLITE)
LYMPH: 45 % (ref 14–48)
MONO: 1 % (ref 0–13)
Variant Lymph: 35 % — ABNORMAL HIGH (ref 0–0)
nRBC: 1 % — ABNORMAL HIGH (ref 0–0)

## 2012-11-08 LAB — CBC WITH DIFFERENTIAL (CANCER CENTER ONLY)
HCT: 26.1 % — ABNORMAL LOW (ref 38.7–49.9)
MCH: 34.4 pg — ABNORMAL HIGH (ref 28.0–33.4)
MCV: 108 fL — ABNORMAL HIGH (ref 82–98)
RBC: 2.41 10*6/uL — ABNORMAL LOW (ref 4.20–5.70)
RDW: 16.7 % — ABNORMAL HIGH (ref 11.1–15.7)
WBC: 6.1 10*3/uL (ref 4.0–10.0)

## 2012-11-08 LAB — IRON AND TIBC
%SAT: 32 % (ref 20–55)
Iron: 86 ug/dL (ref 42–165)
TIBC: 268 ug/dL (ref 215–435)

## 2012-11-08 NOTE — Progress Notes (Signed)
This office note has been dictated.

## 2012-11-09 ENCOUNTER — Telehealth: Payer: Self-pay | Admitting: Hematology & Oncology

## 2012-11-09 NOTE — Telephone Encounter (Signed)
Left pt message moved 2-25 to 2-26

## 2012-11-09 NOTE — Progress Notes (Signed)
CC:   [name]. [name], M.D.  DIAGNOSES: 1. Refractory anemia with multilineage dysplasia. 2. Anemia of renal insufficiency. 3. Intermittent iron-deficiency anemia.  CURRENT THERAPY: 1. IV iron as indicated. 2. Aranesp 300 mcg subcu as needed.  INTERIM HISTORY:  Mr. Feenstra comes in for followup.  He feels okay.  He is a little bit tired.  There is no bleeding.  He had no problems over the holidays.  He does get around a little slowly, but overall he seems to be holding his own.  There is no fever.  Her appetite has been pretty decent.  He has had no nausea or vomiting.  He does continue on Coumadin.  PHYSICAL EXAMINATION:  General:  This is an elderly white gentleman in no obvious distress.  Vital signs:  Temperature of 97.5, pulse 68, respiratory rate 18, blood pressure 115/56.  Weight is 207.  Head and neck:  Normocephalic, atraumatic skull.  There are no ocular or oral lesions.  There are no palpable cervical or supraclavicular lymph nodes. Lungs:  Clear bilaterally.  Cardiac:  Regular rate and rhythm with occasional extra beat.  He has a 1/6 systolic ejection murmur.  Abdomen: Soft with good bowel sounds.  There is no palpable abdominal mass. There is no palpable hepatosplenomegaly.  Back:  Some kyphosis. Extremities:  Some trace edema.  LABORATORY STUDIES:  White count of 6.1, hemoglobin 8.3, hematocrit 26.1, platelet count 37,000.  MCV is 108.  IMPRESSION:  Mr. Meckes is an 79 year old gentleman.  He does have multiple medical issues.  He is on Coumadin.  He has been asymptomatic.  I do not think that the Aranesp is really helping him from my point of view.  He has been getting it and he has not had much of a response to the Aranesp.  I do not think iron deficiency is an issue for him.  I think we are going to have to watch him closely.  I just would hate to have to do a bone marrow on him; however, this might be ultimately necessary.  I am going to send his blood  off for flow cytometry today.  I want to see about sending off an erythropoietin level on him.  I guess this may be somewhat helpful.  I want to see him back in about 3 weeks' time.    ______________________________ Josph Macho, M.D. PRE/MEDQ  D:  11/08/2012  T:  11/09/2012  Job:  [id number]

## 2012-11-11 ENCOUNTER — Other Ambulatory Visit: Payer: Self-pay | Admitting: Hematology & Oncology

## 2012-11-11 DIAGNOSIS — C9140 Hairy cell leukemia not having achieved remission: Secondary | ICD-10-CM

## 2012-11-11 LAB — FLOW CYTOMETRY - CHCC SATELLITE

## 2012-11-12 ENCOUNTER — Ambulatory Visit (HOSPITAL_BASED_OUTPATIENT_CLINIC_OR_DEPARTMENT_OTHER): Payer: Medicare Other | Admitting: Hematology & Oncology

## 2012-11-12 ENCOUNTER — Other Ambulatory Visit (HOSPITAL_BASED_OUTPATIENT_CLINIC_OR_DEPARTMENT_OTHER): Payer: Medicare Other | Admitting: Lab

## 2012-11-12 ENCOUNTER — Encounter: Payer: Self-pay | Admitting: Hematology & Oncology

## 2012-11-12 VITALS — BP 123/57 | HR 70 | Temp 97.2°F | Resp 18 | Ht 70.0 in | Wt 203.0 lb

## 2012-11-12 DIAGNOSIS — I4891 Unspecified atrial fibrillation: Secondary | ICD-10-CM

## 2012-11-12 DIAGNOSIS — N289 Disorder of kidney and ureter, unspecified: Secondary | ICD-10-CM

## 2012-11-12 DIAGNOSIS — C9140 Hairy cell leukemia not having achieved remission: Secondary | ICD-10-CM

## 2012-11-12 HISTORY — DX: Hairy cell leukemia not having achieved remission: C91.40

## 2012-11-12 MED ORDER — FAMCICLOVIR 250 MG PO TABS: 250.0000 mg | ORAL_TABLET | Freq: Two times a day (BID) | ORAL | Status: DC

## 2012-11-12 MED ORDER — LEVOFLOXACIN 250 MG PO TABS: 250.0000 mg | ORAL_TABLET | Freq: Every day | ORAL | Status: DC

## 2012-11-12 NOTE — Progress Notes (Signed)
This office note has been dictated.

## 2012-11-12 NOTE — Patient Instructions (Signed)
General Information About Hairy Cell Leukemia Key Points for This Section Hairy cell leukemia is a type of cancer in which the bone marrow makes too many lymphocytes (a type of white blood cell).  Gender and age may affect the risk of hairy cell leukemia.  Signs and symptoms of hairy cell leukemia include infections, tiredness, and pain below the ribs.  Tests that examine the blood and bone marrow are used to detect (find) and diagnose hairy cell leukemia.  Certain factors affect treatment options and prognosis (chance of recovery).    Hairy cell leukemia is a type of cancer in which the bone marrow makes too many lymphocytes (a type of white blood cell).  Hairy cell leukemia is a cancer of the blood and bone marrow. This rare type of leukemia gets worse slowly or does not get worse at all. The disease is called hairy cell leukemia because the leukemia cells look "hairy" when viewed under a microscope.  Normally, the bone marrow makes blood stem cells (immature cells) that become mature blood cells over time. A blood stem cell may become a myeloid stem cell or a lymphoid stem cell.  A myeloid stem cell becomes one of three types of mature blood cells:  Red blood cells that carry oxygen and other substances to all tissues of the body.  White blood cells that fight infection and disease.  Platelets that form blood clots to stop bleeding.  A lymphoid stem cell becomes a lymphoblast cell and then into one of three types of lymphocytes (white blood cells):  B lymphocytes that make antibodies to help fight infection.  T lymphocytes that help B lymphocytes make antibodies to help fight infection.  Natural killer cells that attack cancer cells and viruses.  Enlarge  Blood cell development. A blood stem cell goes through several steps to become a red blood cell, platelet, or white blood cell.  In hairy cell leukemia, too many blood stem cells become lymphocytes. These lymphocytes are  abnormal and do not become healthy white blood cells. They are also called leukemia cells. The leukemia cells can build up in the blood and bone marrow so there is less room for healthy white blood cells, red blood cells, and platelets. This may cause infection, anemia, and easy bleeding. Some of the leukemia cells may collect in the spleen and cause it to swell.  This summary is about hairy cell leukemia. See the following PDQ summaries for information about other types of leukemia:  Adult Acute Lymphoblastic Leukemia Treatment.  Childhood Acute Lymphoblastic Leukemia Treatment.  Chronic Lymphocytic Leukemia Treatment.  Adult Acute Myeloid Leukemia Treatment.  Childhood Acute Myeloid Leukemia/Other Myeloid Malignancies Treatment.  Chronic Myelogenous Leukemia Treatment.  Gender and age may affect the risk of hairy cell leukemia.  Anything that increases your chance of getting a disease is called a risk factor. Having a risk factor does not mean that you will get cancer; not having risk factors doesnt mean that you will not get cancer. Talk with your doctor if you think you may be at risk. The cause of hairy cell leukemia is unknown. It occurs more often in older men.  Signs and symptoms of hairy cell leukemia include infections, tiredness, and pain below the ribs.  These and other signs and symptoms may be caused by hairy cell leukemia or by other conditions. Check with your doctor if you have any of the following:  Weakness or feeling tired.  Fever or frequent infections.  Easy  bruising or bleeding.  Shortness of breath.  Weight loss for no known reason.  Pain or a feeling of fullness below the ribs.  Painless lumps in the neck, underarm, stomach, or groin.  Tests that examine the blood and bone marrow are used to detect (find) and diagnose hairy cell leukemia.  The following tests and procedures may be used:  Physical exam and history : An exam of the body to check general signs of  health, including checking for signs of disease, such as a swollen spleen, lumps, or anything else that seems unusual. A history of the patients health habits and past illnesses and treatments will also be taken.  Complete blood count (CBC): A procedure in which a sample of blood is drawn and checked for the following:  The number of red blood cells, white blood cells, and platelets.  The amount of hemoglobin (the protein that carries oxygen) in the red blood cells.  The portion of the sample made up of red blood cells.  Enlarge  Complete blood count (CBC). Blood is collected by inserting a needle into a vein and allowing the blood to flow into a tube. The blood sample is sent to the laboratory and the red blood cells, white blood cells, and platelets are counted. The CBC is used to test for, diagnose, and monitor many different conditions.  Peripheral blood smear : A procedure in which a sample of blood is checked for cells that look "hairy," the number and kinds of white blood cells, the number of platelets, and changes in the shape of blood cells.  Blood chemistry studies : A procedure in which a blood sample is checked to measure the amounts of certain substances released into the blood by organs and tissues in the body. An unusual (higher or lower than normal) amount of a substance can be a sign of disease in the organ or tissue that makes it.  Bone marrow aspiration and biopsy : The removal of bone marrow, blood, and a small piece of bone by inserting a hollow needle into the hipbone or breastbone. A pathologist views the bone marrow, blood, and bone under a microscope to look for signs of cancer.  Enlarge  Bone marrow aspiration and biopsy. After a small area of skin is numbed, a Jamshidi needle (a long, hollow needle) is inserted into the patients hip bone. Samples of blood, bone, and bone marrow are removed for examination under a microscope.  Immunophenotyping : A laboratory test in which  the antigens or markers on the surface of a blood or bone marrow cell are checked to see what type of cell it is. This test is done to diagnose the specific type of leukemia by comparing the cancer cells to normal cells of the immune system.  Flow cytometry : A laboratory test that measures the number of cells in a sample, the percentage of live cells in a sample, and certain characteristics of cells, such as size, shape, and the presence of tumor markers on the cell surface. The cells are stained with a light-sensitive dye, placed in a fluid, and passed in a stream before a laser or other type of light. The measurements are based on how the light-sensitive dye reacts to the light.  Cytogenetic analysis : A laboratory test in which cells in a sample of tissue are viewed under a microscope to look for certain changes in the chromosomes.  CT scan (CAT scan): A procedure that makes a series of detailed pictures of  areas inside the body, taken from different angles. The pictures are made by a computer linked to an x-ray machine. A dye may be injected into a vein or swallowed to help the organs or tissues show up more clearly. This procedure is also called computed tomography, computerized tomography, or computerized axial tomography. A CT scan of the abdomen may be done to check for swollen lymph nodes or a swollen spleen.   Certain factors affect treatment options and prognosis (chance of recovery).  The treatment options may depend on the following:  The number of hairy (leukemia) cells and healthy blood cells in the blood and bone marrow.  Whether the spleen is swollen.  Whether there are signs or symptoms of leukemia, such as infection.  Whether the leukemia has recurred (come back) after previous treatment.  The prognosis (chance of recovery) depends on the following:  Whether the hairy cell leukemia does not grow or grows so slowly it does not need treatment.  Whether the hairy cell leukemia  responds to treatment.  Treatment often results in a long-lasting remission (a period during which some or all of the signs and symptoms of the leukemia are gone). If the leukemia returns after it has been in remission, retreatment often causes another remission.  Stages of Hairy Cell Leukemia Key Points for This Section There is no standard staging system for hairy cell leukemia.    There is no standard staging system for hairy cell leukemia.  Staging is the process used to find out how far the cancer has spread. Groups are used in place of stages for hairy cell leukemia. The disease is grouped as untreated, progressive, or refractory.  Untreated hairy cell leukemia  The hairy cell leukemia is newly diagnosed and has not been treated except to relieve signs or symptoms such as weight loss and infections. In untreated hairy cell leukemia, some or all of the following conditions occur:  Hairy (leukemia) cells are found in the blood and bone marrow.  The number of red blood cells, white blood cells, or platelets may be lower than normal.  The spleen may be larger than normal.  Progressive hairy cell leukemia  In progressive hairy cell leukemia, the leukemia has been treated with either chemotherapy or splenectomy (removal of the spleen) and one or both of the following conditions occur:  There is an increase in the number of hairy cells in the blood or bone marrow.  The number of red blood cells, white blood cells, or platelets in the blood is lower than normal.   Relapsed or Refractory Hairy Cell Leukemia  Relapsed hairy cell leukemia has come back after treatment. Refractory hairy cell leukemia has not responded to treatment.  Treatment Option Overview Key Points for This Section There are different types of treatment for patients with hairy cell leukemia.  Five types of standard treatment are used:  Watchful waiting  Chemotherapy  Biologic therapy  Surgery  Targeted  therapy New types of treatment are being tested in clinical trials.  Patients may want to think about taking part in a clinical trial.  Patients can enter clinical trials before, during, or after starting their cancer treatment.  Follow-up tests may be needed.    There are different types of treatment for patients with hairy cell leukemia.  Different types of treatment are available for patients with hairy cell leukemia. Some treatments are standard (the currently used treatment), and some are being tested in clinical trials. A treatment clinical trial is a research  study meant to help improve current treatments or obtain information on new treatments for patients with cancer. When clinical trials show that a new treatment is better than the standard treatment, the new treatment may become the standard treatment. Patients may want to think about taking part in a clinical trial. Some clinical trials are open only to patients who have not started treatment.  Five types of standard treatment are used:  Watchful waiting  Watchful waiting is closely monitoring a patient's condition, without giving any treatment until signs or symptoms appear or change.  Chemotherapy  Chemotherapy is a cancer treatment that uses drugs to stop the growth of cancer cells, either by killing the cells or by stopping them from dividing. When chemotherapy is taken by mouth or injected into a vein or muscle, the drugs enter the bloodstream and can reach cancer cells throughout the body (systemic chemotherapy). When chemotherapy is placed directly into the cerebrospinal fluid, an organ, or a body cavity such as the abdomen, the drugs mainly affect cancer cells in those areas (regional chemotherapy). The way the chemotherapy is given depends on the type and stage of the cancer being treated. Cladribine and pentostatin are anticancer drugs commonly used to treat hairy cell leukemia. These drugs may increase the risk of other  types of cancer, especially Hodgkin lymphoma and non-Hodgkin lymphoma. Long-term follow up for second cancers is very important.  Biologic therapy  Biologic therapy is a cancer treatment that uses the patients immune system to fight cancer. Substances made by the body or made in a laboratory are used to boost, direct, or restore the bodys natural defenses against cancer. This type of cancer treatment is also called biotherapy or immunotherapy. Interferon alfa is a biologic agent commonly used to treat hairy cell leukemia.  Surgery  Splenectomy is a surgical procedure to remove the spleen.  Targeted therapy  Targeted therapy is a treatment that uses drugs or other substances to identify and attack specific cancer cells without harming normal cells. Monoclonal antibody therapy is a type of targeted therapy used to treat hairy cell leukemia.  Monoclonal antibody therapy uses antibodies made in the laboratory from a single type of immune system cell. These antibodies can identify substances on cancer cells or normal substances that may help cancer cells grow. The antibodies attach to the substances and kill the cancer cells, block their growth, or keep them from spreading. Monoclonal antibodies are given by infusion. They may be used alone or to carry drugs, toxins, or radioactive material directly to cancer cells.  A monoclonal antibody called rituximab may be used for certain patients with hairy cell leukemia.  New types of treatment are being tested in clinical trials.  Information about clinical trials is available from the [employer] site.  Patients may want to think about taking part in a clinical trial.  For some patients, taking part in a clinical trial may be the best treatment choice. Clinical trials are part of the cancer research process. Clinical trials are done to find out if new cancer treatments are safe and effective or better than the standard treatment.  Many of today's  standard treatments for cancer are based on earlier clinical trials. Patients who take part in a clinical trial may receive the standard treatment or be among the first to receive a new treatment.  Patients who take part in clinical trials also help improve the way cancer will be treated in the future. Even when clinical trials do not lead to effective  new treatments, they often answer important questions and help move research forward.  Patients can enter clinical trials before, during, or after starting their cancer treatment.  Some clinical trials only include patients who have not yet received treatment. Other trials test treatments for patients whose cancer has not gotten better. There are also clinical trials that test new ways to stop cancer from recurring (coming back) or reduce the side effects of cancer treatment.  Clinical trials are taking place in many parts of the country. See the Treatment Options section that follows for links to current treatment clinical trials. These have been retrieved from [facility] listing of clinical trials.  Follow-up tests may be needed.  Some of the tests that were done to diagnose the cancer or to find out the stage of the cancer may be repeated. Some tests will be repeated in order to see how well the treatment is working. Decisions about whether to continue, change, or stop treatment may be based on the results of these tests. This is sometimes called re-staging.  Some of the tests will continue to be done from time to time after treatment has ended. The results of these tests can show if your condition has changed or if the cancer has recurred (come back). These tests are sometimes called follow-up tests or check-ups.  Treatment Options for Hairy Cell Leukemia   Untreated Hairy Cell Leukemia  If the patient's blood cell counts are not too low and there are no signs or symptoms, treatment may not be needed and the patient is carefully watched for changes  in his or her condition. If blood cell counts become too low or if signs or symptoms appear, initial treatment may include the following:  Chemotherapy.  Splenectomy.  Check for [place] clinical trials from NCI's list of cancer clinical trials that are now accepting patients with untreated hairy cell leukemia. For more specific results, refine the search by using other search features, such as the location of the trial, the type of treatment, or the name of the drug. Talk with your doctor about clinical trials that may be right for you. General information about clinical trials is available from the [employer] site. Progressive Hairy Cell Leukemia  Treatment for progressive hairy cell leukemia may include the following:  Chemotherapy.  Biologic therapy.  Splenectomy.  A clinical trial of chemotherapy and targeted therapy with a monoclonal antibody (rituximab).  Check for [place] clinical trials from NCI's list of cancer clinical trials that are now accepting patients with progressive hairy cell leukemia, initial treatment. For more specific results, refine the search by using other search features, such as the location of the trial, the type of treatment, or the name of the drug. Talk with your doctor about clinical trials that may be right for you. General information about clinical trials is available from the [employer] site. Relapsed or Refractory Hairy Cell Leukemia  Treatment of relapsed or refractory hairy cell leukemia may include the following:  Chemotherapy.  Biologic therapy.  Targeted therapy with a monoclonal antibody (rituximab).  High-dose chemotherapy.  A clinical trial of a new biologic therapy.  A clinical trial of chemotherapy and targeted therapy with a monoclonal antibody (rituximab).  Check for [place] clinical trials from NCI's list of cancer clinical trials that are now accepting patients with refractory hairy cell leukemia. For more specific results, refine the search by using  other search features, such as the location of the trial, the type of treatment, or the name of  the drug. Talk with your doctor about clinical trials that may be right for you. General information about clinical trials is available from the [employer] site.  To Learn More About Hairy Cell Leukemia  For more information from the [facility] about hairy cell leukemia, see the following:  Leukemia Home Page  Biological Therapies for Cancer  Understanding Cancer Series: Targeted Therapies (Advances in Targeted Therapy)  Targeted Cancer Therapies  For general cancer information and other resources from the [facility], see the following:  What You Need to Know About Cancer  Understanding Cancer Series: Cancer  Cancer Staging  Chemotherapy and You: Support for People With Cancer  Radiation Therapy and You: Support for People With Cancer  Coping with Cancer: Supportive and Palliative Care  Questions to Ask Your Doctor About Cancer  Cancer Library  Information For Survivors/Caregivers/Advocates   Changes to This Summary (08/04/2012)  The PDQ cancer information summaries are reviewed regularly and updated as new information becomes available. This section describes the latest changes made to this summary as of the date above.  Editorial changes were made to this summary.  About This PDQ Summary   About PDQ  Physician Data Query (PDQ) is the National Cancer Institute's (NCI's) comprehensive cancer information database. The PDQ database contains summaries of the latest published information on cancer prevention, detection, genetics, treatment, supportive care, and complementary and alternative medicine. Most summaries come in two versions. The health professional versions have detailed information written in technical language. The patient versions are written in easy-to-understand, nontechnical language. Both versions have cancer information that is accurate and up  to date and most versions are also available in [place].  PDQ is a service of the NCI. The NCI is part of the [employer] (NIH). NIH is the [employer]. The PDQ summaries are based on an independent review of the medical literature. They are not policy statements of the NCI or the NIH. Purpose of This Summary  This PDQ cancer information summary has current information about the treatment of hairy cell leukemia. It is meant to inform and help patients, families, and caregivers. It does not give formal guidelines or recommendations for making decisions about health care. Reviewers and Updates  Editorial Boards write the PDQ cancer information summaries and keep them up to date. These Boards are made up of experts in cancer treatment and other specialties related to cancer. The summaries are reviewed regularly and changes are made when there is new information. The date on each summary ("Date Last Modified") is the date of the most recent change.  The information in this patient summary was taken from the health professional version, which is reviewed regularly and updated as needed, by the PDQ Adult Treatment Editorial Board. Clinical Trial Information  A clinical trial is a study to answer a scientific question, such as whether one treatment is better than another. Trials are based on past studies and what has been learned in the laboratory. Each trial answers certain scientific questions in order to find new and better ways to help cancer patients. During treatment clinical trials, information is collected about the effects of a new treatment and how well it works. If a clinical trial shows that a new treatment is better than one currently being used, the new treatment may become "standard." Patients may want to think about taking part in a clinical trial. Some clinical trials are open only to patients who have not started treatment.  Clinical  trials are listed in PDQ and can be found online at [employer] site. Many cancer doctors who take part in clinical trials are also listed in PDQ. For more information, call the Cancer Information Service 1-800-4-CANCER (503-254-7771). Permission to Use This Summary  PDQ is a Publishing rights manager. The content of PDQ documents can be used freely as text. It cannot be identified as an NCI PDQ cancer information summary unless the whole summary is shown and it is updated regularly. However, a user would be allowed to write a sentence such as NCIs PDQ cancer information summary about breast cancer prevention states the risks in the following way: [include excerpt from the summary].  The best way to cite this PDQ summary is:  National Cancer Institute: PDQ Hairy Cell Leukemia Treatment. Lafe Garin, MD: [facility]. Date last modified <MM/DD/YYYY>. Available at: [email or web address].net/. Accessed <MM/DD/YYYY>.  Images in this summary are used with permission of the author(s), artist, and/or publisher for use in the PDQ summaries only. If you want to use an image from a PDQ summary and you are not using the whole summary, you must get permission from the owner. It cannot be given by the [facility]. Information about using the images in this summary, along with many other images related to cancer can be found in Visuals Online. Visuals Online is a collection of more than 2,000 scientific images. Disclaimer  The information in these summaries should not be used to make decisions about insurance reimbursement. More information on insurance coverage is available on Cancer.gov on the Coping with Cancer: Financial, Insurance, and Psychologist, forensic page. Contact us  More information about contacting us or receiving help with the Cancer.gov Web site can be found on our Contact us for Help page. Questions can also be submitted to Cancer.gov  through the Web sites [employer].  Get More Information From NCI  Call 1-800-4-CANCER  For more information, [place] residents may call the National Cancer Institute's (NCI's) Cancer Information Service toll-free at 1-800-4-CANCER (931)479-9666) Monday through Friday from 8:00 a.m. to 8:00 p.m., [place] Time. A trained Cancer Information Specialist is available to answer your questions.  Chat online  The [employer] online chat service provides Internet users with the ability to chat online with an Investment banker, operational. The service is available from 8:00 a.m. to 11:00 p.m. Eastern time, Monday through Friday. Information Specialists can help Internet users find information on NCI Web sites and answer questions about cancer.  Write to [place]  For more information from the NCI, please write to this address:  NCI Public Inquiries Office [phone] Dr. Room [phone] [facility] [address] [place] [address]  Search the NCI Web site  The NCI Web site provides online access to information on cancer, clinical trials, and other Web sites and organizations that offer support and resources for cancer patients and their families. For a quick search, use the search box in the upper right corner of each Web page. The results for a wide range of search terms will include a list of "Best Bets," editorially chosen [employer] that are most closely related to the search term entered.  There are also many other places to get materials and information about cancer treatment and services. Hospitals in your area may have information about local and regional agencies that have information on finances, getting to and from treatment, receiving care at home, and dealing with problems related to cancer treatment.  Find Publications  The NCI has booklets and other  materials for patients, health professionals, and the public. These publications discuss types of cancer, methods of cancer treatment, coping with  cancer, and clinical trials. Some publications provide information on tests for cancer, cancer causes and prevention, cancer statistics, and NCI research activities. NCI materials on these and other topics may be ordered online or printed directly from the NCI Publications Locator. These materials can also be ordered by telephone from the Cancer Information Service toll-free at 1-800-4-CANCER (502) 580-4635).

## 2012-11-13 NOTE — Progress Notes (Signed)
CC:   [name]. [name], M.D.  DIAGNOSIS:  Hairy cell leukemia.  CURRENT THERAPY:  The patient to start reduced dose weekly 2-CDA next week.  INTERIM HISTORY:  [name].  When he was here earlier this week, I did send off flow cytometry on his peripheral blood.  Shockingly enough, the blood did come back positive for hairy cell leukemia.  Under the microscope, he does have a few atypical lymphocytes that do appear to have hairy cell properties.  This would certainly explain the pancytopenia.  I do not think we need to put him through a bone marrow test as the flow cytometry gives me the information that I need.  He does feel a little tired.  When we saw him a couple days ago his hemoglobin was 8.3.  I want to try to avoid transfusing him, if I can.  We also checked an erythropoietin level on him.  This was okay at 143.  His ferritin was okay at a level of 312 with iron saturation of 32%.  I realize that [name] is 79 years Rice.  I realize that he does have other health issues.  He does have the renal insufficiency.  I feel that we probably do have a chance of helping him out with chemotherapy.  I really believe that reduced dose 2-CDA would be tolerated by him.  I had a very long talk with him and his wife.  I explained what hairy cell leukemia was.  I explained to him how it affects the bone marrow and blood counts.  I told him that with the treatment with 2-CDA, that the success rates are about 70-80%.  I realize that we cannot treat him aggressively.  However, if we can at least reduce the burden of hairy cell in the bone marrow, we should be able to help his blood counts.  He has had no bleeding.  He is on Coumadin.  I did give him prescriptions for Famvir and Levaquin to take prophylactically to try to help minimize risk of infection.  We will have to watch the Coumadin levels, given that he is on the Levaquin.  He has had no fever.  He  has had no problem with bowels or bladder.  PHYSICAL EXAMINATION:  General:  This is an elderly, somewhat frail white gentleman in no obvious distress.  Vital signs:  Temperature of 97.2, pulse 70, respiratory rate 18, blood pressure 123/57.  Weight is 203.  Head and neck:  Normocephalic, atraumatic skull.  There are no ocular or oral lesions.  There are no palpable cervical or supraclavicular lymph nodes.  Lungs:  Clear bilaterally.  There may be some slight decrease at the bases.  Cardiac:  Regular rate and rhythm with a normal S1 and S2.  He has a 1/6 systolic ejection murmur. Abdomen:  No splenomegaly.  Extremities:  Osteoarthritic changes secondary to age.  Neurological:  No focal neurological deficits.  Skin: No ecchymoses.  He has some actinic keratoses.  LABS:  We did not do labs this visit.  IMPRESSION:  [name] is an 79 year Rice gentleman who has hairy cell leukemia.  Again, this is proven by flow cytometry.  I want to try to make this as easy as possible for [name].  Again, his performance status is ECOG 2 at best.  We really have to be cautious with our dosing.  Again, I think that reduced dose 2-CDA would be reasonable for him.  I do  not think that nausea and vomiting should be all that bad.  Again, will have to watch his Coumadin.  We will go ahead and plan to get started next week.  We will see about 6 weeks of 2-CDA.  We may have to transfuse him during this course of therapy.  I want him to come back to see [place] in 2 weeks when he gets his second cycle of chemo.  I think we are probably going to have to see him weekly with each chemo treatment because of his age and co-morbid medical conditions.  I spent a good 45 minutes with him and his wife.  I answered all their questions.    ______________________________ Josph Macho, M.D. PRE/MEDQ  D:  11/12/2012  T:  11/13/2012  Job:  [id number]

## 2012-11-16 ENCOUNTER — Encounter: Payer: Self-pay | Admitting: *Deleted

## 2012-11-16 ENCOUNTER — Other Ambulatory Visit: Payer: Medicare Other

## 2012-11-17 ENCOUNTER — Ambulatory Visit (HOSPITAL_BASED_OUTPATIENT_CLINIC_OR_DEPARTMENT_OTHER): Payer: Medicare Other

## 2012-11-17 ENCOUNTER — Other Ambulatory Visit (HOSPITAL_BASED_OUTPATIENT_CLINIC_OR_DEPARTMENT_OTHER): Payer: Medicare Other | Admitting: Lab

## 2012-11-17 ENCOUNTER — Other Ambulatory Visit: Payer: Medicare Other

## 2012-11-17 VITALS — BP 136/63 | HR 69 | Temp 95.0°F | Resp 18

## 2012-11-17 DIAGNOSIS — C9140 Hairy cell leukemia not having achieved remission: Secondary | ICD-10-CM

## 2012-11-17 DIAGNOSIS — I4891 Unspecified atrial fibrillation: Secondary | ICD-10-CM

## 2012-11-17 DIAGNOSIS — Z5111 Encounter for antineoplastic chemotherapy: Secondary | ICD-10-CM

## 2012-11-17 LAB — MANUAL DIFFERENTIAL (CHCC SATELLITE)
Eos: 1 % (ref 0–7)
MONO: 1 % (ref 0–13)
SEG: 14 % — ABNORMAL LOW (ref 40–75)

## 2012-11-17 LAB — PROTIME-INR (CHCC SATELLITE)
INR: 2.9 (ref 2.0–3.5)
Protime: 34.8 Seconds — ABNORMAL HIGH (ref 10.6–13.4)

## 2012-11-17 LAB — CBC WITH DIFFERENTIAL (CANCER CENTER ONLY)
HCT: 26.7 % — ABNORMAL LOW (ref 38.7–49.9)
MCV: 108 fL — ABNORMAL HIGH (ref 82–98)
Platelets: 39 10*3/uL — ABNORMAL LOW (ref 145–400)
RBC: 2.47 10*6/uL — ABNORMAL LOW (ref 4.20–5.70)
WBC: 7.6 10*3/uL (ref 4.0–10.0)

## 2012-11-17 MED ORDER — SODIUM CHLORIDE 0.9 % IV SOLN
0.0750 mg/kg | Freq: Once | INTRAVENOUS | Status: AC
  Administered 2012-11-17: 7 mg via INTRAVENOUS
  Filled 2012-11-17: qty 7

## 2012-11-17 MED ORDER — PROCHLORPERAZINE MALEATE 10 MG PO TABS
10.0000 mg | ORAL_TABLET | Freq: Once | ORAL | Status: AC
  Administered 2012-11-17: 10 mg via ORAL

## 2012-11-17 MED ORDER — PROCHLORPERAZINE EDISYLATE 5 MG/ML IJ SOLN: 10.0000 mg | Freq: Once | INTRAMUSCULAR | Status: DC

## 2012-11-17 MED ORDER — SODIUM CHLORIDE 0.9 % IV SOLN
Freq: Once | INTRAVENOUS | Status: AC
Start: 2012-11-17 — End: 2012-11-17
  Administered 2012-11-17: 09:00:00 via INTRAVENOUS

## 2012-11-17 NOTE — Patient Instructions (Addendum)
[facility] Discharge Instructions for Patients Receiving Chemotherapy  Today you received the following chemotherapy agents Cladribine  To help prevent nausea and vomiting after your treatment, we encourage you to take your nausea medication:  Zofran 8mg  (ondanestron) - take 1 tab every 12 hours as needed for moderate nausea or vomiting.  Phenergan 12.5 mg (promethazine) - take 1 tab every 8 hours as needed for mild nausea or vomiting.  Other Meds  Colace - this is a stool softener Take 100mg  capsule 2-12 times a day as needed. If you have to take more than 6 capsules of Colace a day call the Cancer Center.  Senna - this is a mild laxative used to treat mild constipation. May take 2 tabs by mouth daily or up to twice a day as needed for mild constipation  If you develop nausea and vomiting that is not controlled by your nausea medication, call the clinic. If it is after clinic hours your family physician or the after hours number for the clinic or go to the Emergency Department.   BELOW ARE SYMPTOMS THAT SHOULD BE REPORTED IMMEDIATELY:  *FEVER GREATER THAN 100.5 F  *CHILLS WITH OR WITHOUT FEVER  NAUSEA AND VOMITING THAT IS NOT CONTROLLED WITH YOUR NAUSEA MEDICATION  *UNUSUAL SHORTNESS OF BREATH  *UNUSUAL BRUISING OR BLEEDING  TENDERNESS IN MOUTH AND THROAT WITH OR WITHOUT PRESENCE OF ULCERS  *URINARY PROBLEMS  *BOWEL PROBLEMS  UNUSUAL RASH Items with * indicate a potential emergency and should be followed up as soon as possible.    Cladribine injection for infusion What is this medicine? CLADRIBINE (KLA dri been) is a chemotherapy drug. This medicine reduces the growth of cancer cells and can suppress the immune system. It is used for treating leukemias. This medicine may be used for other purposes; ask your health care provider or pharmacist if you have questions. What should I tell my health care provider before I take this medicine? They need to know  if you have any of these conditions: -bleeding problems -infection (especially a virus infection such as chickenpox, cold sores, or herpes) -kidney disease -liver disease -an unusual or allergic reaction to cladribine, benzyl alcohol, other medicines, foods, dyes, or preservatives -pregnant or trying to get pregnant -breast-feeding How should I use this medicine? This drug is given as an infusion into a vein. It is administered in a hospital or clinic by a specially trained health care professional. Talk to your pediatrician regarding the use of this medicine in children. While this drug may be prescribed for children for selected conditions, precautions do apply. Overdosage: If you think you have taken too much of this medicine contact a poison control center or emergency room at once. NOTE: This medicine is only for you. Do not share this medicine with others. What if I miss a dose? It is important not to miss a dose. Call your doctor or health care professional if you are unable to keep an appointment. What may interact with this medicine? -vaccines Talk to your doctor or health care professional before taking any of these medicines: -acetaminophen -aspirin -ibuprofen -naproxen -ketoprofen This list may not describe all possible interactions. Give your health care provider a list of all the medicines, herbs, non-prescription drugs, or dietary supplements you use. Also tell them if you smoke, drink alcohol, or use illegal drugs. Some items may interact with your medicine. What should I watch for while using this medicine? This drug may make you feel generally unwell. This is  not uncommon, as chemotherapy can affect healthy cells as well as cancer cells. Report any side effects. Continue your course of treatment even though you feel ill unless your doctor tells you to stop. In some cases, you may be given additional medicines to help with side effects. Follow all directions for their  use. Call your doctor or health care professional for advice if you get a fever, chills or sore throat, or other symptoms of a cold or flu. Do not treat yourself. This drug decreases your body's ability to fight infections. Try to avoid being around people who are sick. This medicine may increase your risk to bruise or bleed. Call your doctor or health care professional if you notice any unusual bleeding. Be careful brushing and flossing your teeth or using a toothpick because you may get an infection or bleed more easily. If you have any dental work done, tell your dentist you are receiving this medicine. Avoid taking products that contain aspirin, acetaminophen, ibuprofen, naproxen, or ketoprofen unless instructed by your doctor. These medicines may hide a fever. Do not become pregnant while taking this medicine. Women should inform their doctor if they wish to become pregnant or think they might be pregnant. There is a potential for serious side effects to an unborn child. Talk to your health care professional or pharmacist for more information. Do not breast-feed an infant while taking this medicine. If you are a man, you should not father a child while receiving treatment. What side effects may I notice from receiving this medicine? Side effects that you should report to your doctor or health care professional as soon as possible: -allergic reactions like skin rash, itching or hives, swelling of the face, lips, or tongue -low blood counts - This drug may decrease the number of white blood cells, red blood cells and platelets. You may be at increased risk for infections and bleeding. -signs of infection - fever or chills, cough, sore throat, pain or difficulty passing urine -signs of decreased platelets or bleeding - bruising, pinpoint red spots on the skin, black, tarry stools, nosebleeds -signs of decreased red blood cells - unusually weak or tired, fainting spells, lightheadedness -abdominal  pain -breathing problems -dizziness -mouth sores -trouble passing urine or change in the amount of urine Side effects that usually do not require medical attention (report to your doctor or health care professional if they continue or are bothersome): -diarrhea -headache -loss of appetite -nausea, vomiting -pain or redness at the injection site -weak or tired This list may not describe all possible side effects. Call your doctor for medical advice about side effects. You may report side effects to FDA at 1-800-FDA-1088. Where should I keep my medicine? This drug is given in a hospital or clinic and will not be stored at home. NOTE: This sheet is a summary. It may not cover all possible information. If you have questions about this medicine, talk to your doctor, pharmacist, or health care provider.  2013, [employer]. (12/28/2007 2:42:56 PM) [facility] Discharge Instructions for Patients Receiving Chemotherapy  Today you received chemotherapy agents:  Leustatin  To help prevent nausea and vomiting after your treatment, we encourage you to take your nausea medication as prescribed. If you develop nausea and vomiting that is not controlled by your nausea medication, call the clinic. If it is after clinic hours your family physician or the after hours number for the clinic or go to the Emergency Department.   BELOW ARE SYMPTOMS THAT  SHOULD BE REPORTED IMMEDIATELY:  *FEVER GREATER THAN 100.5 F  *CHILLS WITH OR WITHOUT FEVER  NAUSEA AND VOMITING THAT IS NOT CONTROLLED WITH YOUR NAUSEA MEDICATION  *UNUSUAL SHORTNESS OF BREATH  *UNUSUAL BRUISING OR BLEEDING  TENDERNESS IN MOUTH AND THROAT WITH OR WITHOUT PRESENCE OF ULCERS  *URINARY PROBLEMS  *BOWEL PROBLEMS  UNUSUAL RASH Items with * indicate a potential emergency and should be followed up as soon as possible.   Please let the nurse know about any problems that you may have experienced. Feel free to  call the clinic you have any questions or concerns. The clinic phone number is [phone].   I have been informed and understand all the instructions given to me. I know to contact the clinic, my physician, or go to the Emergency Department if any problems should occur. I do not have any questions at this time, but understand that I may call the clinic during office hours   should I have any questions or need assistance in obtaining follow up care.    __________________________________________  _____________  __________ Signature of Patient or Authorized Representative            Date                   Time    __________________________________________ Nurse's Signature

## 2012-11-18 ENCOUNTER — Telehealth: Payer: Self-pay | Admitting: Oncology

## 2012-11-18 ENCOUNTER — Encounter: Payer: Self-pay | Admitting: Oncology

## 2012-11-18 NOTE — Progress Notes (Signed)
24 Hour Chemotherapy Follow up Call  Neomia Dear called at home following administration of Leustatin chemotherapy.  Spoke with wife.  Patient reports no complaints  Medications reviewed Zofran 8mg  every 12 hours as needed for nausea or vomiting and Phenergan 12.5mg  every 6 hours prn nausea.   Following interventions recommended -Call if questions arise.  Reviewed all post chemotherapy instructions with patient and when should call clinic or MD.  Patient verbalized understanding.

## 2012-11-23 ENCOUNTER — Ambulatory Visit (HOSPITAL_BASED_OUTPATIENT_CLINIC_OR_DEPARTMENT_OTHER): Payer: Medicare Other

## 2012-11-23 ENCOUNTER — Other Ambulatory Visit: Payer: Medicare Other | Admitting: Lab

## 2012-11-23 VITALS — BP 129/70 | HR 78 | Temp 97.8°F

## 2012-11-23 DIAGNOSIS — N189 Chronic kidney disease, unspecified: Secondary | ICD-10-CM

## 2012-11-23 DIAGNOSIS — I4891 Unspecified atrial fibrillation: Secondary | ICD-10-CM

## 2012-11-23 DIAGNOSIS — C9140 Hairy cell leukemia not having achieved remission: Secondary | ICD-10-CM

## 2012-11-23 DIAGNOSIS — Z5111 Encounter for antineoplastic chemotherapy: Secondary | ICD-10-CM

## 2012-11-23 LAB — MANUAL DIFFERENTIAL (CHCC SATELLITE)
ALC: 1.8 10*3/uL (ref 0.9–3.3)
ANC (CHCC HP manual diff): 0.9 10*3/uL — ABNORMAL LOW (ref 1.5–6.5)
Band Neutrophils: 1 % (ref 0–10)
Platelet Morphology: NORMAL

## 2012-11-23 LAB — CBC WITH DIFFERENTIAL (CANCER CENTER ONLY)
HCT: 26.2 % — ABNORMAL LOW (ref 38.7–49.9)
HGB: 8.4 g/dL — ABNORMAL LOW (ref 13.0–17.1)
MCH: 34.1 pg — ABNORMAL HIGH (ref 28.0–33.4)
MCHC: 32.1 g/dL (ref 32.0–35.9)
MCV: 107 fL — ABNORMAL HIGH (ref 82–98)
Platelets: 37 10*3/uL — ABNORMAL LOW (ref 145–400)
RBC: 2.46 10*6/uL — ABNORMAL LOW (ref 4.20–5.70)
RDW: 16.9 % — ABNORMAL HIGH (ref 11.1–15.7)
WBC: 3 10*3/uL — ABNORMAL LOW (ref 4.0–10.0)

## 2012-11-23 LAB — PROTIME-INR (CHCC SATELLITE)
INR: 1.4 — ABNORMAL LOW (ref 2.0–3.5)
Protime: 16.8 Seconds — ABNORMAL HIGH (ref 10.6–13.4)

## 2012-11-23 MED ORDER — PROCHLORPERAZINE MALEATE 10 MG PO TABS
10.0000 mg | ORAL_TABLET | Freq: Once | ORAL | Status: AC
  Administered 2012-11-23: 10 mg via ORAL

## 2012-11-23 MED ORDER — SODIUM CHLORIDE 0.9 % IV SOLN
0.0750 mg/kg | Freq: Once | INTRAVENOUS | Status: AC
  Administered 2012-11-23: 7 mg via INTRAVENOUS
  Filled 2012-11-23: qty 7

## 2012-11-23 MED ORDER — SODIUM CHLORIDE 0.9 % IV SOLN
Freq: Once | INTRAVENOUS | Status: AC
Start: 2012-11-23 — End: 2012-11-23
  Administered 2012-11-23: 11:00:00 via INTRAVENOUS

## 2012-11-23 NOTE — Patient Instructions (Signed)
[facility] Discharge Instructions for Patients Receiving Chemotherapy  Today you received the following chemotherapy agents Cladribine  To help prevent nausea and vomiting after your treatment, we encourage you to take your nausea medicine as prescribed   If you develop nausea and vomiting that is not controlled by your nausea medication, call the clinic. If it is after clinic hours your family physician or the after hours number for the clinic or go to the Emergency Department.   BELOW ARE SYMPTOMS THAT SHOULD BE REPORTED IMMEDIATELY:  *FEVER GREATER THAN 100.5 F  *CHILLS WITH OR WITHOUT FEVER  NAUSEA AND VOMITING THAT IS NOT CONTROLLED WITH YOUR NAUSEA MEDICATION  *UNUSUAL SHORTNESS OF BREATH  *UNUSUAL BRUISING OR BLEEDING  TENDERNESS IN MOUTH AND THROAT WITH OR WITHOUT PRESENCE OF ULCERS  *URINARY PROBLEMS  *BOWEL PROBLEMS  UNUSUAL RASH Items with * indicate a potential emergency and should be followed up as soon as possible.  One of the nurses will contact you 24 hours after your treatment. Please let the nurse know about any problems that you may have experienced. Feel free to call the clinic you have any questions or concerns. The clinic phone number is [phone].   I have been informed and understand all the instructions given to me. I know to contact the clinic, my physician, or go to the Emergency Department if any problems should occur. I do not have any questions at this time, but understand that I may call the clinic during office hours   should I have any questions or need assistance in obtaining follow up care.    __________________________________________  _____________  __________ Signature of Patient or Authorized Representative            Date                   Time    __________________________________________ Nurse's Signature    

## 2012-11-25 ENCOUNTER — Encounter: Payer: Self-pay | Admitting: Hematology & Oncology

## 2012-11-29 ENCOUNTER — Ambulatory Visit (HOSPITAL_BASED_OUTPATIENT_CLINIC_OR_DEPARTMENT_OTHER): Payer: Medicare Other

## 2012-11-29 ENCOUNTER — Ambulatory Visit (HOSPITAL_BASED_OUTPATIENT_CLINIC_OR_DEPARTMENT_OTHER): Payer: Medicare Other | Admitting: Medical

## 2012-11-29 ENCOUNTER — Other Ambulatory Visit (HOSPITAL_BASED_OUTPATIENT_CLINIC_OR_DEPARTMENT_OTHER): Payer: Medicare Other | Admitting: Lab

## 2012-11-29 ENCOUNTER — Other Ambulatory Visit: Payer: Self-pay | Admitting: Pharmacist

## 2012-11-29 VITALS — BP 134/56 | HR 76 | Temp 97.3°F | Resp 18 | Ht 70.0 in | Wt 203.0 lb

## 2012-11-29 DIAGNOSIS — C9140 Hairy cell leukemia not having achieved remission: Secondary | ICD-10-CM

## 2012-11-29 LAB — CBC WITH DIFFERENTIAL (CANCER CENTER ONLY)
BASO%: 0 % (ref 0.0–2.0)
HCT: 25.7 % — ABNORMAL LOW (ref 38.7–49.9)
LYMPH#: 0.5 10*3/uL — ABNORMAL LOW (ref 0.9–3.3)
MONO#: 0 10*3/uL — ABNORMAL LOW (ref 0.1–0.9)
NEUT%: 41.6 % (ref 40.0–80.0)
Platelets: 43 10*3/uL — ABNORMAL LOW (ref 145–400)
RBC: 2.42 10*6/uL — ABNORMAL LOW (ref 4.20–5.70)
RDW: 16.3 % — ABNORMAL HIGH (ref 11.1–15.7)
WBC: 0.9 10*3/uL — CL (ref 4.0–10.0)

## 2012-11-29 LAB — RETICULOCYTES (CHCC)
RBC.: 2.43 MIL/uL — ABNORMAL LOW (ref 4.22–5.81)
Retic Ct Pct: 1 % (ref 0.4–2.3)

## 2012-11-29 LAB — COMPREHENSIVE METABOLIC PANEL
AST: 19 U/L (ref 0–37)
Albumin: 3.7 g/dL (ref 3.5–5.2)
Alkaline Phosphatase: 133 U/L — ABNORMAL HIGH (ref 39–117)
Potassium: 4.2 mEq/L (ref 3.5–5.3)
Sodium: 138 mEq/L (ref 135–145)
Total Protein: 6.8 g/dL (ref 6.0–8.3)

## 2012-11-29 LAB — CHCC SATELLITE - SMEAR

## 2012-11-29 MED ORDER — SODIUM CHLORIDE 0.9 % IV SOLN
0.0750 mg/kg | Freq: Once | INTRAVENOUS | Status: AC
  Administered 2012-11-29: 7 mg via INTRAVENOUS
  Filled 2012-11-29: qty 7

## 2012-11-29 MED ORDER — PEGFILGRASTIM INJECTION 6 MG/0.6ML: 6.0000 mg | Freq: Once | SUBCUTANEOUS | Status: DC

## 2012-11-29 MED ORDER — SODIUM CHLORIDE 0.9 % IV SOLN
Freq: Once | INTRAVENOUS | Status: AC
  Administered 2012-11-29: 10:00:00 via INTRAVENOUS

## 2012-11-29 MED ORDER — PROCHLORPERAZINE MALEATE 10 MG PO TABS
10.0000 mg | ORAL_TABLET | Freq: Once | ORAL | Status: AC
  Administered 2012-11-29: 10 mg via ORAL

## 2012-11-29 NOTE — Progress Notes (Signed)
Ok for treatment today per dr. Myna Hidalgo.  Dr. Myna Hidalgo wants patient to receive Neulasta tomorrow. 11/30/12  Reviewed with patient the importance of coming tomorrow for his Neulasta shot.  Reminded him that his counts were extremely low and he was at risk for infection.  Patient reassured me that he would definitely be there for his shot tomorrow.

## 2012-11-29 NOTE — Progress Notes (Signed)
Diagnosis: Hairy cell leukemia.  Current therapy:Weekly 2-CDA (reduced dose) s/p 2 cycles  Interim history: [name] presents today for an office followup visit.  He is status post 2 weekly cycles of reduced dose 2-CDA.  So far, he is tolerated the chemotherapy well without any major toxicity.  He's not reporting any nausea.  He still has a good appetite.  His energy level remains low.  He is quite neutropenic.  His white count is 0.  0.9, with an ANC of 0.4.  He is not reporting any recent infections.  He denies any fevers.  He denies any upper respiratory symptoms.  I think we will go ahead and give him a Neulasta injection.  We will like him to come back tomorrow, however, he expressed, that he is unable to get a ride, and is requesting we give him the Neulasta injection today.  We did check with pharmacy and they said it would be okay to give today.  He remains on Coumadin.  He's not reporting any bleeding symptoms.  He's also on Famvir and Levaquin prophylactically to minimize his risk of infection.  He, reports, a good appetite.  He denies any nausea, vomiting, diarrhea, constipation, chest pain, shortness of breath, or cough.  He denies any fevers, chills, or night sweats.  He denies any obvious, or abnormal bleeding.  He denies any pain.  He denies any mouth sores.  He denies any headaches, visual changes, or rashes.  He does have some minimal chronic lower leg swelling.    Review of Systems: Constitutional:Negative for malaise/fatigue, fever, chills, weight loss, diaphoresis, activity change, appetite change, and unexpected weight change.  HEENT: Negative for double vision, blurred vision, visual loss, ear pain, tinnitus, congestion, rhinorrhea, epistaxis sore throat or sinus disease, oral pain/lesion, tongue soreness Respiratory: Negative for cough, chest tightness, shortness of breath, wheezing and stridor.  Cardiovascular: Negative for chest pain, palpitations, leg swelling, orthopnea, PND, DOE  or claudication Gastrointestinal: Negative for nausea, vomiting, abdominal pain, diarrhea, constipation, blood in stool, melena, hematochezia, abdominal distention, anal bleeding, rectal pain, anorexia and hematemesis.  Genitourinary: Negative for dysuria, frequency, hematuria,  Musculoskeletal: Negative for myalgias, back pain, joint swelling, arthralgias and gait problem.  Skin: Negative for rash, color change, pallor and wound.  Neurological:. Negative for dizziness/light-headedness, tremors, seizures, syncope, facial asymmetry, speech difficulty, weakness, numbness, headaches and paresthesias.  Hematological: Negative for adenopathy. Does not bruise/bleed easily.  Psychiatric/Behavioral:  Negative for depression, no loss of interest in normal activity or change in sleep pattern.   Physical Exam: This is an elderly, 79 year old, white gentleman, in no obvious distress Vitals: temperature 97.3 degrees, pulse 76, respirations 18, blood pressure 134/56, weight 203 pounds  HEENT reveals a normocephalic, atraumatic skull, no scleral icterus, no oral lesions  Neck is supple without any cervical or supraclavicular adenopathy.  Lungs are clear to auscultation bilaterally. There are no wheezes, rales or rhonci Cardiac is regular rate and rhythm with a normal S1 and S2. There are no murmurs, rubs, or bruits.  Abdomen is soft with good bowel sounds, there is no palpable mass. There is no palpable hepatosplenomegaly. There is no palpable fluid wave.  Musculoskeletal no tenderness of the spine, ribs, or hips.  Extremities there are no clubbing, cyanosis, or edema.  Skin no petechia, purpura or ecchymosis Neurologic is nonfocal.  Laboratory Data:  white count 0.9, hemoglobin 8.3, hematocrit 25.7, platelets 43,000, ANC 0.4   Current Outpatient Prescriptions on File Prior to Visit  Medication Sig Dispense Refill  .  carvedilol (COREG) 25 MG tablet Take 25 mg by mouth once.      . famciclovir (FAMVIR)  250 MG tablet Take 1 tablet (250 mg total) by mouth 2 (two) times daily.  30 tablet  6  . furosemide (LASIX) 20 MG tablet Take 1 tablet (20 mg total) by mouth daily.  30 tablet  6  . levofloxacin (LEVAQUIN) 250 MG tablet Take 1 tablet (250 mg total) by mouth daily.  30 tablet  4  . omeprazole (PRILOSEC) 20 MG capsule Take 20 mg by mouth daily.       . simvastatin (ZOCOR) 40 MG tablet Take 40 mg by mouth at bedtime.        . Tamsulosin HCl (FLOMAX) 0.4 MG CAPS Take 0.4 mg by mouth daily.        . vitamin B-12 (CYANOCOBALAMIN) 1000 MCG tablet Take 1,000 mcg by mouth daily.      Marland Kitchen warfarin (COUMADIN) 5 MG tablet Take 5 mg by mouth daily.        No current facility-administered medications on file prior to visit.   Assessment/Plan: This is a pleasant, 79 year old, white gentleman, with the following issues:  #1.Hairy Cell leukemia.  He will go ahead with his third cycle today.  Overall, he is tolerating the chemotherapy well without any major toxicity.  The overall plan, is to treat him for total of 6 weeks.  #2.  Pancytopenia.  This is secondary to #1, as well as chemotherapy.  We will go ahead and get [name] Neulasta injection today.    #3.  Anemia.  This is secondary to #1, as well as chemotherapy.  He is asymptomatic.  We will transfuse him as needed.  #4.  Followup.  We will follow back up with [name] in one week, but before then should there be questions or concerns.

## 2012-11-29 NOTE — Patient Instructions (Addendum)
[facility] Discharge Instructions for Patients Receiving Chemotherapy  Today you received the following chemotherapy agents Cladribine  To help prevent nausea and vomiting after your treatment, we encourage you to take your nausea medicine as prescribed   If you develop nausea and vomiting that is not controlled by your nausea medication, call the clinic. If it is after clinic hours your family physician or the after hours number for the clinic or go to the Emergency Department.   BELOW ARE SYMPTOMS THAT SHOULD BE REPORTED IMMEDIATELY:  *FEVER GREATER THAN 100.5 F  *CHILLS WITH OR WITHOUT FEVER  NAUSEA AND VOMITING THAT IS NOT CONTROLLED WITH YOUR NAUSEA MEDICATION  *UNUSUAL SHORTNESS OF BREATH  *UNUSUAL BRUISING OR BLEEDING  TENDERNESS IN MOUTH AND THROAT WITH OR WITHOUT PRESENCE OF ULCERS  *URINARY PROBLEMS  *BOWEL PROBLEMS  UNUSUAL RASH Items with * indicate a potential emergency and should be followed up as soon as possible.  One of the nurses will contact you 24 hours after your treatment. Please let the nurse know about any problems that you may have experienced. Feel free to call the clinic you have any questions or concerns. The clinic phone number is [phone].   I have been informed and understand all the instructions given to me. I know to contact the clinic, my physician, or go to the Emergency Department if any problems should occur. I do not have any questions at this time, but understand that I may call the clinic during office hours   should I have any questions or need assistance in obtaining follow up care.    __________________________________________  _____________  __________ Signature of Patient or Authorized Representative            Date                   Time    __________________________________________ Nurse's Signature

## 2012-11-30 ENCOUNTER — Ambulatory Visit (HOSPITAL_BASED_OUTPATIENT_CLINIC_OR_DEPARTMENT_OTHER): Payer: Medicare Other

## 2012-11-30 ENCOUNTER — Other Ambulatory Visit: Payer: Medicare Other | Admitting: Lab

## 2012-11-30 ENCOUNTER — Ambulatory Visit: Payer: Medicare Other | Admitting: Hematology & Oncology

## 2012-11-30 ENCOUNTER — Ambulatory Visit: Payer: Medicare Other

## 2012-11-30 VITALS — BP 116/54 | HR 70 | Temp 96.6°F | Resp 18

## 2012-11-30 MED ORDER — PEGFILGRASTIM INJECTION 6 MG/0.6ML
6.0000 mg | Freq: Once | SUBCUTANEOUS | Status: AC
  Administered 2012-11-30: 6 mg via SUBCUTANEOUS

## 2012-11-30 NOTE — Patient Instructions (Signed)

## 2012-12-01 ENCOUNTER — Ambulatory Visit: Payer: Medicare Other

## 2012-12-01 ENCOUNTER — Other Ambulatory Visit: Payer: Medicare Other | Admitting: Lab

## 2012-12-01 ENCOUNTER — Ambulatory Visit: Payer: Medicare Other | Admitting: Hematology & Oncology

## 2012-12-06 ENCOUNTER — Encounter (HOSPITAL_COMMUNITY)
Admission: RE | Admit: 2012-12-06 | Discharge: 2012-12-06 | Disposition: A | Discharge status: Home / Self Care | Payer: Medicare Other | Source: Ambulatory Visit | Attending: Hematology & Oncology | Admitting: Hematology & Oncology

## 2012-12-06 ENCOUNTER — Ambulatory Visit (HOSPITAL_BASED_OUTPATIENT_CLINIC_OR_DEPARTMENT_OTHER): Payer: Medicare Other | Admitting: Medical

## 2012-12-06 ENCOUNTER — Other Ambulatory Visit (HOSPITAL_BASED_OUTPATIENT_CLINIC_OR_DEPARTMENT_OTHER): Payer: Medicare Other | Admitting: Lab

## 2012-12-06 ENCOUNTER — Ambulatory Visit: Payer: Medicare Other

## 2012-12-06 VITALS — Temp 97.5°F | Ht 70.0 in | Wt 204.0 lb

## 2012-12-06 DIAGNOSIS — C9140 Hairy cell leukemia not having achieved remission: Secondary | ICD-10-CM

## 2012-12-06 DIAGNOSIS — D649 Anemia, unspecified: Secondary | ICD-10-CM | POA: Insufficient documentation

## 2012-12-06 LAB — CBC WITH DIFFERENTIAL (CANCER CENTER ONLY)
BASO#: 0 10*3/uL (ref 0.0–0.2)
BASO%: 0 % (ref 0.0–2.0)
Eosinophils Absolute: 0 10*3/uL (ref 0.0–0.5)
HCT: 25.3 % — ABNORMAL LOW (ref 38.7–49.9)
HGB: 8.1 g/dL — ABNORMAL LOW (ref 13.0–17.1)
LYMPH#: 0.4 10*3/uL — ABNORMAL LOW (ref 0.9–3.3)
LYMPH%: 26.9 % (ref 14.0–48.0)
MCV: 108 fL — ABNORMAL HIGH (ref 82–98)
MONO#: 0 10*3/uL — ABNORMAL LOW (ref 0.1–0.9)
NEUT%: 71 % (ref 40.0–80.0)
RBC: 2.34 10*6/uL — ABNORMAL LOW (ref 4.20–5.70)
RDW: 16.2 % — ABNORMAL HIGH (ref 11.1–15.7)
WBC: 1.5 10*3/uL — ABNORMAL LOW (ref 4.0–10.0)

## 2012-12-06 LAB — PROTIME-INR (CHCC SATELLITE)
INR: 1.5 — ABNORMAL LOW (ref 2.0–3.5)
Protime: 18 Seconds — ABNORMAL HIGH (ref 10.6–13.4)

## 2012-12-06 LAB — CMP (CANCER CENTER ONLY)
ALT(SGPT): 12 U/L (ref 10–47)
BUN, Bld: 31 mg/dL — ABNORMAL HIGH (ref 7–22)
CO2: 25 mEq/L (ref 18–33)
Calcium: 8.9 mg/dL (ref 8.0–10.3)
Chloride: 103 mEq/L (ref 98–108)
Creat: 1.9 mg/dl — ABNORMAL HIGH (ref 0.6–1.2)
Glucose, Bld: 122 mg/dL — ABNORMAL HIGH (ref 73–118)
Total Bilirubin: 1.1 mg/dl (ref 0.20–1.60)

## 2012-12-06 LAB — TECHNOLOGIST REVIEW CHCC SATELLITE

## 2012-12-06 NOTE — Progress Notes (Signed)
Diagnosis: Hairy cell leukemia.  Current therapy:Weekly 2-CDA (reduced dose) s/p 3 cycles  Interim history: [name] presents today for an office followup visit.  He is status post 3 weekly cycles of reduced dose 2-CDA.  So far, he is tolerated the chemotherapy well without any major toxicity.  He's not reporting any nausea.  He still has a good appetite.  His energy level remains low.  He is was neutropenic when we saw him last.   his white count was 0.  0.9, with an ANC of 0.4.  We did decide to go ahead and give him an injection the following day with Neulasta.  Unfortunately, we are unable to treat him with chemotherapy for a total of 14 days since his Neulasta injection.   His white count has increased some.  His white count today is 1.5 and an ANC of 1.0.  He is not reporting any recent infections.  He denies any fevers.  He denies any upper respiratory symptoms.  He remains on Coumadin.  He's not reporting any bleeding symptoms.  He's also on Famvir and Levaquin prophylactically to minimize his risk of infection.  He, reports, a good appetite.  He denies any nausea, vomiting, diarrhea, constipation, chest pain, shortness of breath, or cough.  He denies any fevers, chills, or night sweats.  He denies any obvious, or abnormal bleeding.  He denies any pain.  He denies any mouth sores.  He denies any headaches, visual changes, or rashes.  He does have some minimal chronic lower leg swelling.  Marland Kitchen  He does report low energy, as well as some dyspnea on exertion.  He did express that he would like to 2 units of packed red blood cells tomorrow.  We will go ahead and get him typed and crossed for tomorrow.    Review of Systems: Constitutional:Negative for malaise/fatigue, fever, chills, weight loss, diaphoresis, activity change, appetite change, and unexpected weight change.  HEENT: Negative for double vision, blurred vision, visual loss, ear pain, tinnitus, congestion, rhinorrhea, epistaxis sore throat or sinus  disease, oral pain/lesion, tongue soreness Respiratory: Negative for cough, chest tightness, shortness of breath, wheezing and stridor.  Cardiovascular: Negative for chest pain, palpitations, leg swelling, orthopnea, PND, DOE or claudication Gastrointestinal: Negative for nausea, vomiting, abdominal pain, diarrhea, constipation, blood in stool, melena, hematochezia, abdominal distention, anal bleeding, rectal pain, anorexia and hematemesis.  Genitourinary: Negative for dysuria, frequency, hematuria,  Musculoskeletal: Negative for myalgias, back pain, joint swelling, arthralgias and gait problem.  Skin: Negative for rash, color change, pallor and wound.  Neurological:. Negative for dizziness/light-headedness, tremors, seizures, syncope, facial asymmetry, speech difficulty, weakness, numbness, headaches and paresthesias.  Hematological: Negative for adenopathy. Does not bruise/bleed easily.  Psychiatric/Behavioral:  Negative for depression, no loss of interest in normal activity or change in sleep pattern.   Physical Exam: This is an elderly, 79 year old, white gentleman, in no obvious distress Vitals: temperature 97.5 degrees, pulse 66, respirations 18, blood pressure 122/45.  Weight 204 pounds  HEENT reveals a normocephalic, atraumatic skull, no scleral icterus, no oral lesions  Neck is supple without any cervical or supraclavicular adenopathy.  Lungs are clear to auscultation bilaterally. There are no wheezes, rales or rhonci Cardiac is regular rate and rhythm with a normal S1 and S2. There are no murmurs, rubs, or bruits.  Abdomen is soft with good bowel sounds, there is no palpable mass. There is no palpable hepatosplenomegaly. There is no palpable fluid wave.  Musculoskeletal no tenderness of the spine, ribs, or hips.  Extremities there are no clubbing, cyanosis, or edema.  Skin no petechia, purpura or ecchymosis Neurologic is nonfocal.  Laboratory Data:  white count 1.5, hemoglobin 8.1,  hematocrit 25.3, platelets 46,000  Current Outpatient Prescriptions on File Prior to Visit  Medication Sig Dispense Refill  . carvedilol (COREG) 25 MG tablet Take 25 mg by mouth once.      . famciclovir (FAMVIR) 250 MG tablet Take 1 tablet (250 mg total) by mouth 2 (two) times daily.  30 tablet  6  . furosemide (LASIX) 20 MG tablet Take 1 tablet (20 mg total) by mouth daily.  30 tablet  6  . levofloxacin (LEVAQUIN) 250 MG tablet Take 1 tablet (250 mg total) by mouth daily.  30 tablet  4  . omeprazole (PRILOSEC) 20 MG capsule Take 20 mg by mouth daily.       . simvastatin (ZOCOR) 40 MG tablet Take 40 mg by mouth at bedtime.        . Tamsulosin HCl (FLOMAX) 0.4 MG CAPS Take 0.4 mg by mouth daily.        . vitamin B-12 (CYANOCOBALAMIN) 1000 MCG tablet Take 1,000 mcg by mouth daily.      Marland Kitchen warfarin (COUMADIN) 5 MG tablet Take 5 mg by mouth daily.        No current facility-administered medications on file prior to visit.   Assessment/Plan: This is a pleasant, 79 year old, white gentleman, with the following issues:  #1.Hairy Cell leukemia.  Marland Kitchen  He is status post 3 cycles.  Overall, he is tolerating it quite well.  The overall plan, is to treat him for total of 6 weeks.  We will have to delay his treatment for today secondary to his recent Neulasta injection.    #2.  Pancytopenia.  This is secondary to #1, as well as chemotherapy.    #3.  Anemia.  This is secondary to #1, as well as chemotherapy.  He is symptomatic.  We will go ahead and get him transfused with 2 units of packed red blood cells tomorrow.    #4.  Followup.  We will follow back up with [name] in one week, but before then should there be questions or concerns.

## 2012-12-07 ENCOUNTER — Ambulatory Visit (HOSPITAL_BASED_OUTPATIENT_CLINIC_OR_DEPARTMENT_OTHER): Payer: Medicare Other

## 2012-12-07 ENCOUNTER — Encounter: Payer: Self-pay | Admitting: *Deleted

## 2012-12-07 VITALS — BP 145/70 | HR 70 | Temp 97.0°F | Resp 20

## 2012-12-07 LAB — ABO/RH: ABO/RH(D): B NEG

## 2012-12-07 MED ORDER — SODIUM CHLORIDE 0.9 % IV SOLN
250.0000 mL | Freq: Once | INTRAVENOUS | Status: AC
  Administered 2012-12-07: 250 mL via INTRAVENOUS

## 2012-12-07 MED ORDER — ACETAMINOPHEN 325 MG PO TABS
650.0000 mg | ORAL_TABLET | Freq: Once | ORAL | Status: AC
  Administered 2012-12-07: 650 mg via ORAL

## 2012-12-07 MED ORDER — DIPHENHYDRAMINE HCL 25 MG PO CAPS
25.0000 mg | ORAL_CAPSULE | Freq: Once | ORAL | Status: AC
  Administered 2012-12-07: 25 mg via ORAL

## 2012-12-07 NOTE — Patient Instructions (Signed)
Blood Transfusion   A blood transfusion replaces your blood or some of its parts. Blood is replaced when you have lost blood because of surgery, an accident, or for severe blood conditions like anemia.  You can donate blood to be used on yourself if you have a planned surgery. If you lose blood during that surgery, your own blood can be given back to you.  Any blood given to you is checked to make sure it matches your blood type. Your temperature, blood pressure, and heart rate (vital signs) will be checked often.   GET HELP RIGHT AWAY IF:    You feel sick to your stomach (nauseous) or throw up (vomit).   You have watery poop (diarrhea).   You have shortness of breath or trouble breathing.   You have blood in your pee (urine) or have dark colored pee.   You have chest pain or tightness.   Your eyes or skin turn yellow (jaundice).   You have a temperature by mouth above 102 F (38.9 C), not controlled by medicine.   You start to shake and have chills.   You develop a a red rash (hives) or feel itchy.   You develop lightheadedness or feel confused.   You develop back, joint, or muscle pain.   You do not feel hungry (lost appetite).   You feel tired, restless, or nervous.   You develop belly (abdominal) cramps.  Document Released: 12/19/2008 Document Revised: 12/15/2011 Document Reviewed: 12/19/2008  Exit[employer] Patient Information 2013 [employer].

## 2012-12-08 LAB — TYPE AND SCREEN: Unit division: 0

## 2012-12-13 ENCOUNTER — Other Ambulatory Visit (HOSPITAL_BASED_OUTPATIENT_CLINIC_OR_DEPARTMENT_OTHER): Payer: Medicare Other | Admitting: Lab

## 2012-12-13 ENCOUNTER — Ambulatory Visit (HOSPITAL_BASED_OUTPATIENT_CLINIC_OR_DEPARTMENT_OTHER): Payer: Medicare Other

## 2012-12-13 VITALS — BP 109/54 | HR 69 | Temp 97.0°F | Resp 18

## 2012-12-13 DIAGNOSIS — C9140 Hairy cell leukemia not having achieved remission: Secondary | ICD-10-CM

## 2012-12-13 DIAGNOSIS — Z5111 Encounter for antineoplastic chemotherapy: Secondary | ICD-10-CM

## 2012-12-13 LAB — CMP (CANCER CENTER ONLY)
CO2: 24 mEq/L (ref 18–33)
Creat: 2.2 mg/dl — ABNORMAL HIGH (ref 0.6–1.2)
Glucose, Bld: 149 mg/dL — ABNORMAL HIGH (ref 73–118)
Total Bilirubin: 0.9 mg/dl (ref 0.20–1.60)

## 2012-12-13 LAB — CBC WITH DIFFERENTIAL (CANCER CENTER ONLY)
BASO%: 0 % (ref 0.0–2.0)
EOS%: 0.7 % (ref 0.0–7.0)
HCT: 26.3 % — ABNORMAL LOW (ref 38.7–49.9)
LYMPH%: 22.4 % (ref 14.0–48.0)
MCHC: 32.3 g/dL (ref 32.0–35.9)
MCV: 105 fL — ABNORMAL HIGH (ref 82–98)
MONO%: 2.2 % (ref 0.0–13.0)
NEUT%: 74.7 % (ref 40.0–80.0)
RDW: 16.6 % — ABNORMAL HIGH (ref 11.1–15.7)

## 2012-12-13 MED ORDER — CLADRIBINE CHEMO INJECTION 1 MG/ML
0.0750 mg/kg | Freq: Once | INTRAVENOUS | Status: AC
  Administered 2012-12-13: 7 mg via INTRAVENOUS
  Filled 2012-12-13: qty 7

## 2012-12-13 MED ORDER — SODIUM CHLORIDE 0.9 % IV SOLN
Freq: Once | INTRAVENOUS | Status: AC
  Administered 2012-12-13: 10:00:00 via INTRAVENOUS

## 2012-12-13 MED ORDER — PROCHLORPERAZINE MALEATE 10 MG PO TABS
10.0000 mg | ORAL_TABLET | Freq: Once | ORAL | Status: AC
  Administered 2012-12-13: 10 mg via ORAL

## 2012-12-13 NOTE — Patient Instructions (Signed)
[facility] Discharge Instructions for Patients Receiving Chemotherapy  Today you received the following chemotherapy agents Cladribine  To help prevent nausea and vomiting after your treatment, we encourage you to take your nausea medicine as prescribed   If you develop nausea and vomiting that is not controlled by your nausea medication, call the clinic. If it is after clinic hours your family physician or the after hours number for the clinic or go to the Emergency Department.   BELOW ARE SYMPTOMS THAT SHOULD BE REPORTED IMMEDIATELY:  *FEVER GREATER THAN 100.5 F  *CHILLS WITH OR WITHOUT FEVER  NAUSEA AND VOMITING THAT IS NOT CONTROLLED WITH YOUR NAUSEA MEDICATION  *UNUSUAL SHORTNESS OF BREATH  *UNUSUAL BRUISING OR BLEEDING  TENDERNESS IN MOUTH AND THROAT WITH OR WITHOUT PRESENCE OF ULCERS  *URINARY PROBLEMS  *BOWEL PROBLEMS  UNUSUAL RASH Items with * indicate a potential emergency and should be followed up as soon as possible.  One of the nurses will contact you 24 hours after your treatment. Please let the nurse know about any problems that you may have experienced. Feel free to call the clinic you have any questions or concerns. The clinic phone number is [phone].   I have been informed and understand all the instructions given to me. I know to contact the clinic, my physician, or go to the Emergency Department if any problems should occur. I do not have any questions at this time, but understand that I may call the clinic during office hours   should I have any questions or need assistance in obtaining follow up care.    __________________________________________  _____________  __________ Signature of Patient or Authorized Representative            Date                   Time    __________________________________________ Nurse's Signature    

## 2012-12-16 ENCOUNTER — Other Ambulatory Visit: Payer: Self-pay | Admitting: Medical

## 2012-12-21 ENCOUNTER — Ambulatory Visit: Payer: Medicare Other

## 2012-12-21 ENCOUNTER — Ambulatory Visit: Payer: Medicare Other | Admitting: Medical

## 2012-12-21 ENCOUNTER — Other Ambulatory Visit: Payer: Medicare Other | Admitting: Lab

## 2012-12-22 ENCOUNTER — Encounter: Payer: Self-pay | Admitting: Hematology & Oncology

## 2012-12-23 ENCOUNTER — Ambulatory Visit (HOSPITAL_BASED_OUTPATIENT_CLINIC_OR_DEPARTMENT_OTHER): Payer: Medicare Other | Admitting: Medical

## 2012-12-23 ENCOUNTER — Ambulatory Visit (HOSPITAL_BASED_OUTPATIENT_CLINIC_OR_DEPARTMENT_OTHER): Payer: Medicare Other

## 2012-12-23 ENCOUNTER — Other Ambulatory Visit (HOSPITAL_BASED_OUTPATIENT_CLINIC_OR_DEPARTMENT_OTHER): Payer: Medicare Other | Admitting: Lab

## 2012-12-23 VITALS — BP 133/68 | HR 70 | Temp 97.5°F | Resp 18 | Ht 70.0 in | Wt 201.0 lb

## 2012-12-23 DIAGNOSIS — C9140 Hairy cell leukemia not having achieved remission: Secondary | ICD-10-CM

## 2012-12-23 LAB — PROTIME-INR (CHCC SATELLITE): INR: 3.2 (ref 2.0–3.5)

## 2012-12-23 LAB — COMPREHENSIVE METABOLIC PANEL
ALT: 14 U/L (ref 0–53)
AST: 18 U/L (ref 0–37)
Alkaline Phosphatase: 156 U/L — ABNORMAL HIGH (ref 39–117)
CO2: 23 mEq/L (ref 19–32)
Sodium: 139 mEq/L (ref 135–145)
Total Bilirubin: 0.8 mg/dL (ref 0.3–1.2)
Total Protein: 7.1 g/dL (ref 6.0–8.3)

## 2012-12-23 LAB — CBC WITH DIFFERENTIAL (CANCER CENTER ONLY)
Eosinophils Absolute: 0 10*3/uL (ref 0.0–0.5)
HCT: 27 % — ABNORMAL LOW (ref 38.7–49.9)
LYMPH#: 0.4 10*3/uL — ABNORMAL LOW (ref 0.9–3.3)
LYMPH%: 19.8 % (ref 14.0–48.0)
MCV: 103 fL — ABNORMAL HIGH (ref 82–98)
MONO#: 0 10*3/uL — ABNORMAL LOW (ref 0.1–0.9)
NEUT%: 76.9 % (ref 40.0–80.0)
Platelets: 87 10*3/uL — ABNORMAL LOW (ref 145–400)
RBC: 2.61 10*6/uL — ABNORMAL LOW (ref 4.20–5.70)
WBC: 1.8 10*3/uL — ABNORMAL LOW (ref 4.0–10.0)

## 2012-12-23 LAB — HOLD TUBE, BLOOD BANK - CHCC SATELLITE

## 2012-12-23 MED ORDER — PROCHLORPERAZINE MALEATE 10 MG PO TABS
10.0000 mg | ORAL_TABLET | Freq: Once | ORAL | Status: AC
  Administered 2012-12-23: 10 mg via ORAL

## 2012-12-23 MED ORDER — SODIUM CHLORIDE 0.9 % IV SOLN
Freq: Once | INTRAVENOUS | Status: AC
  Administered 2012-12-23: 11:00:00 via INTRAVENOUS

## 2012-12-23 MED ORDER — SODIUM CHLORIDE 0.9 % IV SOLN
0.0750 mg/kg | Freq: Once | INTRAVENOUS | Status: AC
  Administered 2012-12-23: 7 mg via INTRAVENOUS
  Filled 2012-12-23: qty 7

## 2012-12-23 NOTE — Patient Instructions (Addendum)
Cladribine injection for infusion  What is this medicine?  CLADRIBINE (KLA dri been) is a chemotherapy drug. This medicine reduces the growth of cancer cells and can suppress the immune system. It is used for treating leukemias.  This medicine may be used for other purposes; ask your health care provider or pharmacist if you have questions.  What should I tell my health care provider before I take this medicine?  They need to know if you have any of these conditions:  -bleeding problems  -infection (especially a virus infection such as chickenpox, cold sores, or herpes)  -kidney disease  -liver disease  -an unusual or allergic reaction to cladribine, benzyl alcohol, other medicines, foods, dyes, or preservatives  -pregnant or trying to get pregnant  -breast-feeding  How should I use this medicine?  This drug is given as an infusion into a vein. It is administered in a hospital or clinic by a specially trained health care professional.  Talk to your pediatrician regarding the use of this medicine in children. While this drug may be prescribed for children for selected conditions, precautions do apply.  Overdosage: If you think you have taken too much of this medicine contact a poison control center or emergency room at once.  NOTE: This medicine is only for you. Do not share this medicine with others.  What if I miss a dose?  It is important not to miss a dose. Call your doctor or health care professional if you are unable to keep an appointment.  What may interact with this medicine?  -vaccines  Talk to your doctor or health care professional before taking any of these medicines:  -acetaminophen  -aspirin  -ibuprofen  -naproxen  -ketoprofen  This list may not describe all possible interactions. Give your health care provider a list of all the medicines, herbs, non-prescription drugs, or dietary supplements you use. Also tell them if you smoke, drink alcohol, or use illegal drugs. Some items may interact with your  medicine.  What should I watch for while using this medicine?  This drug may make you feel generally unwell. This is not uncommon, as chemotherapy can affect healthy cells as well as cancer cells. Report any side effects. Continue your course of treatment even though you feel ill unless your doctor tells you to stop.  In some cases, you may be given additional medicines to help with side effects. Follow all directions for their use.  Call your doctor or health care professional for advice if you get a fever, chills or sore throat, or other symptoms of a cold or flu. Do not treat yourself. This drug decreases your body's ability to fight infections. Try to avoid being around people who are sick.  This medicine may increase your risk to bruise or bleed. Call your doctor or health care professional if you notice any unusual bleeding.  Be careful brushing and flossing your teeth or using a toothpick because you may get an infection or bleed more easily. If you have any dental work done, tell your dentist you are receiving this medicine.  Avoid taking products that contain aspirin, acetaminophen, ibuprofen, naproxen, or ketoprofen unless instructed by your doctor. These medicines may hide a fever.  Do not become pregnant while taking this medicine. Women should inform their doctor if they wish to become pregnant or think they might be pregnant. There is a potential for serious side effects to an unborn child. Talk to your health care professional or pharmacist for more information.   Do not breast-feed an infant while taking this medicine.  If you are a man, you should not father a child while receiving treatment.  What side effects may I notice from receiving this medicine?  Side effects that you should report to your doctor or health care professional as soon as possible:  -allergic reactions like skin rash, itching or hives, swelling of the face, lips, or tongue  -low blood counts - This drug may decrease the number of  white blood cells, red blood cells and platelets. You may be at increased risk for infections and bleeding.  -signs of infection - fever or chills, cough, sore throat, pain or difficulty passing urine  -signs of decreased platelets or bleeding - bruising, pinpoint red spots on the skin, black, tarry stools, nosebleeds  -signs of decreased red blood cells - unusually weak or tired, fainting spells, lightheadedness  -abdominal pain  -breathing problems  -dizziness  -mouth sores  -trouble passing urine or change in the amount of urine  Side effects that usually do not require medical attention (report to your doctor or health care professional if they continue or are bothersome):  -diarrhea  -headache  -loss of appetite  -nausea, vomiting  -pain or redness at the injection site  -weak or tired  This list may not describe all possible side effects. Call your doctor for medical advice about side effects. You may report side effects to FDA at 1-800-FDA-1088.  Where should I keep my medicine?  This drug is given in a hospital or clinic and will not be stored at home.  NOTE: This sheet is a summary. It may not cover all possible information. If you have questions about this medicine, talk to your doctor, pharmacist, or health care provider.   2013, [employer]. (12/28/2007 2:42:56 PM)

## 2012-12-23 NOTE — Progress Notes (Signed)
Diagnosis: Hairy cell leukemia.  Current therapy:Weekly 2-CDA (reduced dose) s/p 4 cycles  Interim history: [name] presents today for an office followup visit.  He is status post 4 weekly cycles of reduced dose 2-CDA.  So far, he is tolerating the chemotherapy well without any major toxicity.  He did have an episode of significant neutropenia back on 11/29/2012.  We did have to give him a Neulasta injection.  His counts have come up since then. He's not reporting any nausea.  He still has a good appetite.  His energy level is moderate.  , he does ride 5 miles a day on a stationary bike.    He is not reporting any recent infections.  He denies any fevers.  He denies any upper respiratory symptoms.  He remains on Coumadin.  He's not reporting any bleeding symptoms.  He's also on Famvir and Levaquin prophylactically to minimize his risk of infection.  He, reports, a good appetite.  He denies any nausea, vomiting, diarrhea, constipation, chest pain, shortness of breath, or cough.  He denies any fevers, chills, or night sweats.  He denies any obvious, or abnormal bleeding.  He denies any pain.  He denies any mouth sores.  He denies any headaches, visual changes, or rashes.  He does have some minimal chronic lower leg swelling.  .   Review of Systems: Constitutional:Negative for malaise/fatigue, fever, chills, weight loss, diaphoresis, activity change, appetite change, and unexpected weight change.  HEENT: Negative for double vision, blurred vision, visual loss, ear pain, tinnitus, congestion, rhinorrhea, epistaxis sore throat or sinus disease, oral pain/lesion, tongue soreness Respiratory: Negative for cough, chest tightness, shortness of breath, wheezing and stridor.  Cardiovascular: Negative for chest pain, palpitations, leg swelling, orthopnea, PND, DOE or claudication Gastrointestinal: Negative for nausea, vomiting, abdominal pain, diarrhea, constipation, blood in stool, melena, hematochezia, abdominal  distention, anal bleeding, rectal pain, anorexia and hematemesis.  Genitourinary: Negative for dysuria, frequency, hematuria,  Musculoskeletal: Negative for myalgias, back pain, joint swelling, arthralgias and gait problem.  Skin: Negative for rash, color change, pallor and wound.  Neurological:. Negative for dizziness/light-headedness, tremors, seizures, syncope, facial asymmetry, speech difficulty, weakness, numbness, headaches and paresthesias.  Hematological: Negative for adenopathy. Does not bruise/bleed easily.  Psychiatric/Behavioral:  Negative for depression, no loss of interest in normal activity or change in sleep pattern.   Physical Exam: This is an elderly, 79 year old, white gentleman, in no obvious distress Vitals:  Temperature 97.5 degrees, pulse 70, respirations 18, blood pressure 133/58, weight 201 pounds HEENT reveals a normocephalic, atraumatic skull, no scleral icterus, no oral lesions  Neck is supple without any cervical or supraclavicular adenopathy.  Lungs are clear to auscultation bilaterally. There are no wheezes, rales or rhonci Cardiac is regular rate and rhythm with a normal S1 and S2. There are no murmurs, rubs, or bruits.  Abdomen is soft with good bowel sounds, there is no palpable mass. There is no palpable hepatosplenomegaly. There is no palpable fluid wave.  Musculoskeletal no tenderness of the spine, ribs, or hips.  Extremities there are no clubbing, cyanosis, or edema.  Skin no petechia, purpura or ecchymosis Neurologic is nonfocal.  Laboratory Data:  White count 1.8, hemoglobin 8.6, hematocrit 27.0, platelets 87,000  Current Outpatient Prescriptions on File Prior to Visit  Medication Sig Dispense Refill  . carvedilol (COREG) 25 MG tablet Take 25 mg by mouth once.      . famciclovir (FAMVIR) 250 MG tablet Take 1 tablet (250 mg total) by mouth 2 (two) times daily.  30 tablet  6  . furosemide (LASIX) 20 MG tablet Take 1 tablet (20 mg total) by mouth daily.   30 tablet  6  . levofloxacin (LEVAQUIN) 250 MG tablet Take 1 tablet (250 mg total) by mouth daily.  30 tablet  4  . omeprazole (PRILOSEC) 20 MG capsule Take 20 mg by mouth daily.       . simvastatin (ZOCOR) 40 MG tablet Take 40 mg by mouth at bedtime.        . Tamsulosin HCl (FLOMAX) 0.4 MG CAPS Take 0.4 mg by mouth daily.        . vitamin B-12 (CYANOCOBALAMIN) 1000 MCG tablet Take 1,000 mcg by mouth daily.      Marland Kitchen warfarin (COUMADIN) 5 MG tablet Take 5 mg by mouth daily.        No current facility-administered medications on file prior to visit.   Assessment/Plan: This is a pleasant, 79 year old, white gentleman, with the following issues:  #1.Hairy Cell leukemia.  Marland Kitchen  He is status post 4 cycles.  Overall, he is tolerating it quite well.  He will go ahead and proceed with his fifth cycle today.  The overall plan, is to treat him for total of 6 weeks.     #2.  Pancytopenia.  This is secondary to #1, as well as chemotherapy.    #3.  Anemia.  This is secondary to #1, as well as chemotherapy.  .  We will continue to monitor this closely and transfuse him as needed.    #4.  Infection prophylaxis.  He continues on Levaquin.  #5.  Zoster prophylaxis.  He continues on Famvir.  #6.  Followup.  We will follow back up with [name] in one week, but before then should there be questions or concerns.

## 2012-12-30 ENCOUNTER — Other Ambulatory Visit (HOSPITAL_BASED_OUTPATIENT_CLINIC_OR_DEPARTMENT_OTHER): Payer: Medicare Other | Admitting: Lab

## 2012-12-30 ENCOUNTER — Ambulatory Visit (HOSPITAL_BASED_OUTPATIENT_CLINIC_OR_DEPARTMENT_OTHER): Payer: Medicare Other

## 2012-12-30 ENCOUNTER — Ambulatory Visit: Payer: Medicare Other

## 2012-12-30 ENCOUNTER — Other Ambulatory Visit: Payer: Medicare Other | Admitting: Lab

## 2012-12-30 ENCOUNTER — Ambulatory Visit (HOSPITAL_BASED_OUTPATIENT_CLINIC_OR_DEPARTMENT_OTHER): Payer: Medicare Other | Admitting: Hematology & Oncology

## 2012-12-30 VITALS — BP 129/50 | HR 70 | Temp 97.0°F | Resp 18 | Ht 70.0 in | Wt 200.0 lb

## 2012-12-30 DIAGNOSIS — C9140 Hairy cell leukemia not having achieved remission: Secondary | ICD-10-CM

## 2012-12-30 DIAGNOSIS — Z5111 Encounter for antineoplastic chemotherapy: Secondary | ICD-10-CM

## 2012-12-30 DIAGNOSIS — I4891 Unspecified atrial fibrillation: Secondary | ICD-10-CM

## 2012-12-30 LAB — CBC WITH DIFFERENTIAL (CANCER CENTER ONLY)
BASO#: 0 10e3/uL (ref 0.0–0.2)
BASO%: 0 % (ref 0.0–2.0)
EOS%: 2.5 % (ref 0.0–7.0)
Eosinophils Absolute: 0 10e3/uL (ref 0.0–0.5)
HCT: 25.8 % — ABNORMAL LOW (ref 38.7–49.9)
HGB: 8.4 g/dL — ABNORMAL LOW (ref 13.0–17.1)
LYMPH#: 0.2 10e3/uL — ABNORMAL LOW (ref 0.9–3.3)
LYMPH%: 15.1 % (ref 14.0–48.0)
MCH: 33.5 pg — ABNORMAL HIGH (ref 28.0–33.4)
MCHC: 32.6 g/dL (ref 32.0–35.9)
MCV: 103 fL — ABNORMAL HIGH (ref 82–98)
MONO#: 0 10e3/uL — ABNORMAL LOW (ref 0.1–0.9)
MONO%: 1.9 % (ref 0.0–13.0)
NEUT#: 1.3 10e3/uL — ABNORMAL LOW (ref 1.5–6.5)
NEUT%: 80.5 % — ABNORMAL HIGH (ref 40.0–80.0)
Platelets: 80 10e3/uL — ABNORMAL LOW (ref 145–400)
RBC: 2.51 10e6/uL — ABNORMAL LOW (ref 4.20–5.70)
RDW: 15.4 % (ref 11.1–15.7)
WBC: 1.6 10e3/uL — ABNORMAL LOW (ref 4.0–10.0)

## 2012-12-30 LAB — HOLD TUBE, BLOOD BANK - CHCC SATELLITE

## 2012-12-30 LAB — COMPREHENSIVE METABOLIC PANEL
ALT: 15 U/L (ref 0–53)
CO2: 20 mEq/L (ref 19–32)
Calcium: 8.8 mg/dL (ref 8.4–10.5)
Chloride: 109 mEq/L (ref 96–112)
Creatinine, Ser: 1.61 mg/dL — ABNORMAL HIGH (ref 0.50–1.35)
Glucose, Bld: 128 mg/dL — ABNORMAL HIGH (ref 70–99)
Total Bilirubin: 0.8 mg/dL (ref 0.3–1.2)

## 2012-12-30 LAB — PROTIME-INR (CHCC SATELLITE)
INR: 1.8 — ABNORMAL LOW (ref 2.0–3.5)
Protime: 21.6 s — ABNORMAL HIGH (ref 10.6–13.4)

## 2012-12-30 LAB — TECHNOLOGIST REVIEW CHCC SATELLITE

## 2012-12-30 MED ORDER — PROCHLORPERAZINE MALEATE 10 MG PO TABS
10.0000 mg | ORAL_TABLET | Freq: Once | ORAL | Status: AC
  Administered 2012-12-30: 10 mg via ORAL

## 2012-12-30 MED ORDER — SODIUM CHLORIDE 0.9 % IV SOLN
0.0750 mg/kg | Freq: Once | INTRAVENOUS | Status: AC
  Administered 2012-12-30: 7 mg via INTRAVENOUS
  Filled 2012-12-30: qty 7

## 2012-12-30 MED ORDER — SODIUM CHLORIDE 0.9 % IV SOLN
Freq: Once | INTRAVENOUS | Status: AC
  Administered 2012-12-30: 10:00:00 via INTRAVENOUS

## 2012-12-30 NOTE — Progress Notes (Signed)
CC:   [name]. [name], M.D.  DIAGNOSIS:  Hairy-cell leukemia.  CURRENT THERAPY:  The patient to complete his 6th week of 2-CDA today.  INTERIM HISTORY:  Mr. Ducey comes in for his followup.  He is doing okay. We have given him reduced dose of 2-CDA due to his age and overall performance status (ECOG 2-3).  He is feeling fairly well.  He has had no problems with fever.  He did need to get a dose of, I think, Neulasta about a month ago because of marked neutropenia.  He continues on Famvir and Levaquin.  He is on Coumadin, and this is not causing him any issues.  He has not noted any cough.  He has had no fever.  There has been no diarrhea.  He has noted a "scab" on the right side of his nose.  He does see Dr. Terri Piedra of Dermatology.  I recommended to Mr. Victoria that he see Dr. Terri Piedra for this.  PHYSICAL EXAMINATION:  General:  This is an elderly, somewhat frail- appearing white gentleman in no obvious distress.  Vital signs: Temperature of 97.9, pulse 70, respiratory rate 18, blood pressure 129/50.  Weight is 200 pounds.  Head and neck:  Normocephalic, atraumatic skull.  There are no ocular or oral lesions.  There are no palpable cervical or supraclavicular lymph nodes.  Lungs:  Clear bilaterally.  Cardiac:  Regular rate and rhythm, with occasional extra beat.  He has a 1/6 systolic ejection murmur.  Abdomen:  Soft, with good bowel sounds.  There is no fluid wave.  There is no guarding or rebound tenderness.  There is no palpable hepatosplenomegaly.  Extremities: Show no clubbing, cyanosis, or edema.  LABORATORY STUDIES:  White cell count 1.6, hemoglobin 8.4, hematocrit 25.8, platelet count 80,000.  His white cell differential shows 80 segs, 15 lymphs.  IMPRESSION:  Mr. Gilreath is an 79 year old gentleman with a hairy cell leukemia.  We have diagnosed this via a flow cytometry in his blood.  Again, because of his age and overall health issues and performance status, I have not been  too aggressive with his workup (i.e. bone marrow biopsy).  Again, we have given him reduced dose of 2-CDA to try to minimize complications.  Today will be his last dose of 2-CDA.  His lymphocyte count has come down nicely.  Platelet count has come up nicely.  I think that we should be able to see continued improvement in his blood work.  We will get him back in about a month.  I probably will not repeat a flow cytometry on his blood probably for a good 2 months or so.  I told him to continue on the Famvir and the Levaquin until we see him back, and then we will consider taking him off these.    ______________________________ Josph Macho, M.D. PRE/MEDQ  D:  12/29/2012  T:  12/30/2012  Job:  [id number]

## 2012-12-30 NOTE — Patient Instructions (Addendum)
[facility] Discharge Instructions for Patients Receiving Chemotherapy  Today you received the following chemotherapy agents Cladribine  To help prevent nausea and vomiting after your treatment, we encourage you to take your nausea medication as prescribed   If you develop nausea and vomiting that is not controlled by your nausea medication, call the clinic. If it is after clinic hours your family physician or the after hours number for the clinic or go to the Emergency Department.   BELOW ARE SYMPTOMS THAT SHOULD BE REPORTED IMMEDIATELY:  *FEVER GREATER THAN 100.5 F  *CHILLS WITH OR WITHOUT FEVER  NAUSEA AND VOMITING THAT IS NOT CONTROLLED WITH YOUR NAUSEA MEDICATION  *UNUSUAL SHORTNESS OF BREATH  *UNUSUAL BRUISING OR BLEEDING  TENDERNESS IN MOUTH AND THROAT WITH OR WITHOUT PRESENCE OF ULCERS  *URINARY PROBLEMS  *BOWEL PROBLEMS  UNUSUAL RASH Items with * indicate a potential emergency and should be followed up as soon as possible.  One of the nurses will contact you 24 hours after your treatment. Please let the nurse know about any problems that you may have experienced. Feel free to call the clinic you have any questions or concerns. The clinic phone number is [phone].   I have been informed and understand all the instructions given to me. I know to contact the clinic, my physician, or go to the Emergency Department if any problems should occur. I do not have any questions at this time, but understand that I may call the clinic during office hours   should I have any questions or need assistance in obtaining follow up care.    __________________________________________  _____________  __________ Signature of Patient or Authorized Representative            Date                   Time    __________________________________________ Nurse's Signature

## 2012-12-30 NOTE — Progress Notes (Signed)
This office note has been dictated.

## 2013-01-24 ENCOUNTER — Encounter: Payer: Self-pay | Admitting: Internal Medicine

## 2013-01-24 ENCOUNTER — Ambulatory Visit (INDEPENDENT_AMBULATORY_CARE_PROVIDER_SITE_OTHER): Payer: Medicare Other | Admitting: *Deleted

## 2013-01-24 ENCOUNTER — Other Ambulatory Visit: Payer: Self-pay

## 2013-01-24 DIAGNOSIS — I4891 Unspecified atrial fibrillation: Secondary | ICD-10-CM

## 2013-01-24 DIAGNOSIS — Z950 Presence of cardiac pacemaker: Secondary | ICD-10-CM

## 2013-01-24 LAB — REMOTE PACEMAKER DEVICE
ATRIAL PACING PM: 1
BAMS-0001: 150 {beats}/min
LV LEAD THRESHOLD: 1.75 V
RV LEAD IMPEDENCE PM: 560 Ohm
VENTRICULAR PACING PM: 98

## 2013-01-27 ENCOUNTER — Other Ambulatory Visit (HOSPITAL_BASED_OUTPATIENT_CLINIC_OR_DEPARTMENT_OTHER): Payer: Medicare Other | Admitting: Lab

## 2013-01-27 ENCOUNTER — Ambulatory Visit (HOSPITAL_BASED_OUTPATIENT_CLINIC_OR_DEPARTMENT_OTHER): Payer: Medicare Other | Admitting: Hematology & Oncology

## 2013-01-27 VITALS — BP 101/50 | HR 70 | Temp 97.8°F | Resp 18 | Ht 70.0 in | Wt 195.0 lb

## 2013-01-27 DIAGNOSIS — C9140 Hairy cell leukemia not having achieved remission: Secondary | ICD-10-CM

## 2013-01-27 LAB — BASIC METABOLIC PANEL - CANCER CENTER ONLY
CO2: 26 mEq/L (ref 18–33)
Chloride: 102 mEq/L (ref 98–108)
Creat: 2 mg/dl — ABNORMAL HIGH (ref 0.6–1.2)
Potassium: 4 mEq/L (ref 3.3–4.7)
Sodium: 143 mEq/L (ref 128–145)

## 2013-01-27 LAB — CBC WITH DIFFERENTIAL (CANCER CENTER ONLY)
BASO#: 0 10*3/uL (ref 0.0–0.2)
Eosinophils Absolute: 0.1 10*3/uL (ref 0.0–0.5)
HGB: 11 g/dL — ABNORMAL LOW (ref 13.0–17.1)
LYMPH#: 0.2 10*3/uL — ABNORMAL LOW (ref 0.9–3.3)
MCH: 31.4 pg (ref 28.0–33.4)
MONO#: 0.3 10*3/uL (ref 0.1–0.9)
MONO%: 8.7 % (ref 0.0–13.0)
NEUT#: 3.3 10*3/uL (ref 1.5–6.5)
Platelets: 97 10*3/uL — ABNORMAL LOW (ref 145–400)
RBC: 3.5 10*6/uL — ABNORMAL LOW (ref 4.20–5.70)
WBC: 3.9 10*3/uL — ABNORMAL LOW (ref 4.0–10.0)

## 2013-01-27 LAB — CHCC SATELLITE - SMEAR

## 2013-01-27 LAB — LACTATE DEHYDROGENASE: LDH: 101 U/L (ref 94–250)

## 2013-01-27 NOTE — Progress Notes (Signed)
This office note has been dictated.

## 2013-01-28 NOTE — Progress Notes (Signed)
CC:   [name]. [name], M.D.  DIAGNOSIS:  Hairy cell leukemia.  CURRENT THERAPY:  Patient is status post 1 cycle of 2-CdA.  INTERIM HISTORY:  [name] comes in for followup.  We last saw him about a month ago.  He is doing very nicely now.  He completed his last week of 2-CdA back in March.  He feels better.  He says he has a little bit more energy.  He has not noted any bleeding.  He has had no fever.  He has had no nausea or vomiting.  He has had no leg swelling.  He has not noticed any kind of rashes.  PHYSICAL EXAMINATION:  General:  This is an elderly white gentleman in no obvious distress.  Vital signs:  Temperature of 97.8, pulse 70, respiratory rate 18, blood pressure 101/50.  Weight is 195.  Head and neck:  Normocephalic, atraumatic skull.  There are no ocular or oral lesions.  There are no palpable cervical or supraclavicular lymph nodes. Lungs:  Clear bilaterally.  Cardiac:  Regular rate and rhythm with an occasional extra beat.  He has a 1/6 systolic ejection murmur.  Abdomen: Soft with good bowel sounds.  There is no palpable abdominal mass. There is no fluid wave.  There is no palpable hepatosplenomegaly. Extremities:  Some trace edema in his legs.  This is chronic.  Skin: Numerous actinic keratoses and some solar keratoses.  LABORATORY STUDIES:  White cell count is 3.9, hemoglobin 11, hemoglobin, hematocrit 35, platelet count 97,000.  White cell differential shows 87 segs, 6% lymphs, 8 monos.  BUN is 30, creatinine 2.0.  IMPRESSION:  [name] is an 79 year old gentleman with hairy cell leukemia.  We gave him weekly 2-CdA.  We had this dose-reduced secondary to his age and overall co-morbidities.  Again, he has responded hematologically.  His blood count is up.  He has not been this high with his hemoglobin since I first started seeing him. His platelet count is coming up.  His white cell count also is coming up.  At this point in time, I think we can probably  get him off the Famvir. I just do not think that his immune system is all that bad.  I think we also can get him off the Levaquin.  He does continue on Coumadin.  I think we can probably get him back now in 3 months.  I really believe that he should be okay, such that we do not have to have blood work done on him.  I am just so happy that he has responded.  This will certainly prolong his life.  Hopefully, he will continue to have a decent quality of life.    ______________________________ Josph Macho, M.D. PRE/MEDQ  D:  01/27/2013  T:  01/28/2013  Job:  [id number]

## 2013-02-11 NOTE — Telephone Encounter (Signed)
OK to close

## 2013-02-18 ENCOUNTER — Encounter: Payer: Self-pay | Admitting: *Deleted

## 2013-04-05 ENCOUNTER — Other Ambulatory Visit: Payer: Self-pay | Admitting: Surgery

## 2013-04-22 ENCOUNTER — Other Ambulatory Visit (HOSPITAL_BASED_OUTPATIENT_CLINIC_OR_DEPARTMENT_OTHER): Payer: Medicare Other | Admitting: Lab

## 2013-04-22 ENCOUNTER — Ambulatory Visit (HOSPITAL_BASED_OUTPATIENT_CLINIC_OR_DEPARTMENT_OTHER): Payer: Medicare Other | Admitting: Hematology & Oncology

## 2013-04-22 VITALS — BP 135/68 | HR 70 | Temp 97.6°F | Resp 18 | Ht 70.0 in | Wt 196.0 lb

## 2013-04-22 DIAGNOSIS — C9140 Hairy cell leukemia not having achieved remission: Secondary | ICD-10-CM

## 2013-04-22 DIAGNOSIS — I4891 Unspecified atrial fibrillation: Secondary | ICD-10-CM

## 2013-04-22 LAB — CBC WITH DIFFERENTIAL (CANCER CENTER ONLY)
BASO#: 0 10*3/uL (ref 0.0–0.2)
EOS%: 3.6 % (ref 0.0–7.0)
HCT: 39.1 % (ref 38.7–49.9)
HGB: 13 g/dL (ref 13.0–17.1)
LYMPH#: 0.4 10*3/uL — ABNORMAL LOW (ref 0.9–3.3)
LYMPH%: 9 % — ABNORMAL LOW (ref 14.0–48.0)
MCH: 29.9 pg (ref 28.0–33.4)
MCHC: 33.2 g/dL (ref 32.0–35.9)
MCV: 90 fL (ref 82–98)
MONO%: 8.6 % (ref 0.0–13.0)
NEUT%: 78.4 % (ref 40.0–80.0)

## 2013-04-22 LAB — PROTIME-INR (CHCC SATELLITE): INR: 2 (ref 2.0–3.5)

## 2013-04-22 LAB — CMP (CANCER CENTER ONLY)
Alkaline Phosphatase: 69 U/L (ref 26–84)
BUN, Bld: 26 mg/dL — ABNORMAL HIGH (ref 7–22)
Glucose, Bld: 109 mg/dL (ref 73–118)
Sodium: 141 mEq/L (ref 128–145)
Total Bilirubin: 0.8 mg/dl (ref 0.20–1.60)

## 2013-04-22 NOTE — Progress Notes (Signed)
This office note has been dictated.

## 2013-04-23 NOTE — Progress Notes (Signed)
CC:   [name]. [name], M.D. [name], M.D. Jeffrey Salvia, MD, [name]  DIAGNOSIS:  Hairy cell leukemia-clinical remission.  CURRENT THERAPY:  Observation.  INTERIM HISTORY:  [name] comes in for his followup.  He completed his 2-CDA back in March 2014.  He is doing okay.  He has had no problems over the summer.  He is on Coumadin.  There is no bleeding that he has had from Coumadin. He has not noted any problems with fever, sweats, or chills.  His appetite has been quite good.  There has been no change in bowel or bladder habits.  He has been seen by Dr. Terri Rice, Dermatology, for what appears to be a  squamous cell on the right side of his nose.  PHYSICAL EXAMINATION:  General:  This is an elderly white gentleman in no obvious distress.  Vital signs:  Temperature of 97.6, pulse 70, respiratory rate 18, blood pressure 135/68.  Weight is 196.  Head and neck exam:  Shows a normocephalic, atraumatic skull.  There are no ocular or oral lesions.  Neck:  There are no palpable cervical or supraclavicular lymph nodes.  Lungs:  Clear bilaterally.  Cardiac exam: Regular rate and rhythm with a normal S1 and S2.  There are no murmurs, rubs, or bruits.  Abdominal exam:  Soft with good bowel sounds.  There is no palpable abdominal mass.  There is no fluid wave.  There is no palpable hepatosplenomegaly.  Extremities:  Show no clubbing, cyanosis, or edema.  Skin exam:  Shows multiple seborrheic keratoses.  He has actinic and solar keratoses.  LABORATORY STUDIES:  White cell count is 4.8, hemoglobin 13, hematocrit 39.1, platelet count 65,000.  White cell differential is 78 segs, 9 lymphocytes, 9 monos.  Peripheral smear shows good maturation of his white blood cells.  I do not see any atypical lymphocytes.  He has no hypersegmented polys. There is no nucleated red blood cells.  Platelets are decreased in number.  Platelets are well granulated.  IMPRESSION:  [name] is an  79 year old, white gentleman, with a history of hairy cell leukemia.  He got 1 cycle of 2-CDA.  Again, he finished this in March.  His blood counts continue to improve.  He does have the chronic thrombocytopenia.  This may be more of a marrow issue than anything else.  We will go ahead and plan to get back in another 3-4 months now.  I do not see a need for any blood work in between visits.    ______________________________ [name], M.D. PRE/MEDQ  D:  04/22/2013  T:  04/23/2013  Job:  [id number]

## 2013-04-25 ENCOUNTER — Encounter: Payer: Self-pay | Admitting: Internal Medicine

## 2013-04-25 ENCOUNTER — Ambulatory Visit (INDEPENDENT_AMBULATORY_CARE_PROVIDER_SITE_OTHER): Payer: Medicare Other | Admitting: *Deleted

## 2013-04-25 DIAGNOSIS — I4891 Unspecified atrial fibrillation: Secondary | ICD-10-CM

## 2013-04-25 DIAGNOSIS — Z950 Presence of cardiac pacemaker: Secondary | ICD-10-CM

## 2013-04-25 LAB — REMOTE PACEMAKER DEVICE
BAMS-0003: 70 {beats}/min
DEVICE MODEL PM: 2317387
LV LEAD THRESHOLD: 2 V
RV LEAD THRESHOLD: 0.75 V

## 2013-05-03 ENCOUNTER — Encounter: Payer: Self-pay | Admitting: *Deleted

## 2013-05-17 ENCOUNTER — Encounter: Payer: Self-pay | Admitting: Internal Medicine

## 2013-06-22 ENCOUNTER — Encounter: Payer: Self-pay | Admitting: Internal Medicine

## 2013-08-16 ENCOUNTER — Encounter: Payer: Medicare Other | Admitting: Internal Medicine

## 2013-08-18 ENCOUNTER — Other Ambulatory Visit: Payer: Self-pay

## 2013-08-18 MED ORDER — FUROSEMIDE 20 MG PO TABS: 20.0000 mg | ORAL_TABLET | Freq: Every day | ORAL | Status: DC

## 2013-08-23 ENCOUNTER — Encounter: Payer: Medicare Other | Admitting: Internal Medicine

## 2013-08-24 ENCOUNTER — Ambulatory Visit (HOSPITAL_BASED_OUTPATIENT_CLINIC_OR_DEPARTMENT_OTHER): Payer: Medicare Other | Admitting: Hematology & Oncology

## 2013-08-24 ENCOUNTER — Other Ambulatory Visit (HOSPITAL_BASED_OUTPATIENT_CLINIC_OR_DEPARTMENT_OTHER): Payer: Medicare Other | Admitting: Lab

## 2013-08-24 ENCOUNTER — Other Ambulatory Visit (HOSPITAL_COMMUNITY)
Admission: RE | Admit: 2013-08-24 | Discharge: 2013-08-24 | Disposition: A | Discharge status: Home / Self Care | Payer: Medicare Other | Source: Ambulatory Visit | Attending: Hematology & Oncology | Admitting: Hematology & Oncology

## 2013-08-24 VITALS — BP 127/60 | HR 70 | Temp 97.4°F | Resp 18 | Ht 67.0 in | Wt 191.0 lb

## 2013-08-24 DIAGNOSIS — C9140 Hairy cell leukemia not having achieved remission: Secondary | ICD-10-CM

## 2013-08-24 DIAGNOSIS — I4891 Unspecified atrial fibrillation: Secondary | ICD-10-CM

## 2013-08-24 DIAGNOSIS — D72819 Decreased white blood cell count, unspecified: Secondary | ICD-10-CM

## 2013-08-24 LAB — CBC WITH DIFFERENTIAL (CANCER CENTER ONLY)
BASO#: 0 10e3/uL (ref 0.0–0.2)
BASO%: 0.4 % (ref 0.0–2.0)
EOS%: 1.8 % (ref 0.0–7.0)
Eosinophils Absolute: 0.1 10e3/uL (ref 0.0–0.5)
HCT: 40 % (ref 38.7–49.9)
HGB: 13.4 g/dL (ref 13.0–17.1)
LYMPH#: 0.5 10e3/uL — ABNORMAL LOW (ref 0.9–3.3)
LYMPH%: 9 % — ABNORMAL LOW (ref 14.0–48.0)
MCH: 30.6 pg (ref 28.0–33.4)
MCHC: 33.5 g/dL (ref 32.0–35.9)
MCV: 91 fL (ref 82–98)
MONO#: 0.4 10e3/uL (ref 0.1–0.9)
MONO%: 7.5 % (ref 0.0–13.0)
NEUT#: 4.5 10e3/uL (ref 1.5–6.5)
NEUT%: 81.3 % — ABNORMAL HIGH (ref 40.0–80.0)
Platelets: 69 10e3/uL — ABNORMAL LOW (ref 145–400)
RBC: 4.38 10e6/uL (ref 4.20–5.70)
RDW: 13.6 % (ref 11.1–15.7)
WBC: 5.6 10e3/uL (ref 4.0–10.0)

## 2013-08-24 LAB — CMP (CANCER CENTER ONLY)
ALT(SGPT): 23 U/L (ref 10–47)
AST: 21 U/L (ref 11–38)
Albumin: 3.6 g/dL (ref 3.3–5.5)
Alkaline Phosphatase: 62 U/L (ref 26–84)
BUN, Bld: 24 mg/dL — ABNORMAL HIGH (ref 7–22)
CO2: 28 meq/L (ref 18–33)
Calcium: 9.6 mg/dL (ref 8.0–10.3)
Chloride: 101 meq/L (ref 98–108)
Creat: 2.2 mg/dL — ABNORMAL HIGH (ref 0.6–1.2)
Glucose, Bld: 153 mg/dL — ABNORMAL HIGH (ref 73–118)
Potassium: 3.8 meq/L (ref 3.3–4.7)
Sodium: 141 meq/L (ref 128–145)
Total Bilirubin: 1.1 mg/dL (ref 0.20–1.60)
Total Protein: 7.5 g/dL (ref 6.4–8.1)

## 2013-08-24 LAB — PROTIME-INR (CHCC SATELLITE)
INR: 2.8 (ref 2.0–3.5)
Protime: 33.6 s — ABNORMAL HIGH (ref 10.6–13.4)

## 2013-08-24 LAB — CHCC SATELLITE - SMEAR

## 2013-08-24 NOTE — Progress Notes (Signed)
This office note has been dictated.

## 2013-08-26 ENCOUNTER — Encounter: Payer: Self-pay | Admitting: Internal Medicine

## 2013-08-26 ENCOUNTER — Ambulatory Visit (INDEPENDENT_AMBULATORY_CARE_PROVIDER_SITE_OTHER): Payer: Medicare Other | Admitting: Internal Medicine

## 2013-08-26 VITALS — BP 136/74 | HR 70 | Ht 69.0 in | Wt 190.0 lb

## 2013-08-26 DIAGNOSIS — I4891 Unspecified atrial fibrillation: Secondary | ICD-10-CM

## 2013-08-26 DIAGNOSIS — T82190A Other mechanical complication of cardiac electrode, initial encounter: Secondary | ICD-10-CM | POA: Insufficient documentation

## 2013-08-26 DIAGNOSIS — I2510 Atherosclerotic heart disease of native coronary artery without angina pectoris: Secondary | ICD-10-CM

## 2013-08-26 DIAGNOSIS — Z950 Presence of cardiac pacemaker: Secondary | ICD-10-CM

## 2013-08-26 DIAGNOSIS — I5032 Chronic diastolic (congestive) heart failure: Secondary | ICD-10-CM

## 2013-08-26 HISTORY — DX: Other mechanical complication of cardiac electrode, initial encounter: T82.190A

## 2013-08-26 NOTE — Assessment & Plan Note (Signed)
Permanent. Device reprogrammed VVIR

## 2013-08-26 NOTE — Assessment & Plan Note (Signed)
Stable on current therapy 

## 2013-08-26 NOTE — Progress Notes (Signed)
Patient Care Team: Londell Moh, MD as PCP - General (Internal Medicine)   HPI  [name] is a 79 y.o. male is seen following CRT P. implantation for symptomatic bradycardia in this state of ischemic heart disease prior bypass surgery status post bioprosthetic aortic valve and ejection fraction of 40%.  He has now persistent atrial fibrillation   The patient denies chest pain, shortness of breath, nocturnal dyspnea, orthopnea or peripheral edema. There have been no palpitations, lightheadedness or syncope.  He is being treated for anemia attributable to renal insufficiency and some kind of dysplastic syndrome  Review of prior data demonstrates that his echo cardiogram 2012l demonstrated normal left ventricular function  Statin therapy is followed by Dr. [name]  He has been intrcurrently diagnosed w hairy cell leukemia    Past Medical History  Diagnosis Date  . Atrial fibrillation     Paroxysmal, on Coumadin  . CAD (coronary artery disease)     CABGx6 10/2006  . Aortic stenosis     23-mm St. Jude Biocor porcine bioprosthetic at time of CABG 10/2006  . Hypertension   . Colon cancer     s/p colon resection  . Prostate cancer   . GERD (gastroesophageal reflux disease)   . Ischemic cardiomyopathy     EF 40% previously s/p BiV St. Jude PPM. Most recent echo 08/2009 - EF could not be estimated but appeared to be low-normal.  . Shortness of breath   . Anemia   . Blood transfusion     no hx of reaction to transfusions"  . Anemia, iron deficiency 11/20/2011  . Anemia of renal disease 11/20/2011  . Thrombocytopenia 11/20/2011  . Hairy cell leukemia 11/12/2012    Past Surgical History  Procedure Laterality Date  . Insert / replace / remove pacemaker      biventricular, St. Jude  . Coronary artery bypass graft    . Aortic valve replacement    . Left eye cataract extraction    . Appendectomy    . Hemicolectomy  2000    right  . Cardiac surgery      Current  Outpatient Prescriptions  Medication Sig Dispense Refill  . carvedilol (COREG) 25 MG tablet Take 25 mg by mouth once.      . furosemide (LASIX) 20 MG tablet Take 1 tablet (20 mg total) by mouth daily.  30 tablet  3  . mirtazapine (REMERON SOL-TAB) 15 MG disintegrating tablet Take 15 mg by mouth at bedtime.       Marland Kitchen NITROSTAT 0.4 MG SL tablet Place 0.4 mg under the tongue as needed.       Marland Kitchen omeprazole (PRILOSEC) 20 MG capsule Take 20 mg by mouth daily.       . simvastatin (ZOCOR) 40 MG tablet Take 40 mg by mouth at bedtime.        . Tamsulosin HCl (FLOMAX) 0.4 MG CAPS Take 0.4 mg by mouth daily.        . vitamin B-12 (CYANOCOBALAMIN) 1000 MCG tablet Take 1,000 mcg by mouth daily.      Marland Kitchen warfarin (COUMADIN) 5 MG tablet Take 5 mg by mouth daily.        No current facility-administered medications for this visit.    No Known Allergies  Review of Systems negative except from HPI and PMH  Physical Exam BP 136/74  Pulse 70  Ht 5\' 9"  (1.753 m)  Wt 190 lb (86.183 kg)  BMI 28.05 kg/m2 Well  developed and well nourished in no acute distress HENT normal E scleral and icterus clear Neck Supple JVP flat; carotids brisk and full Clear to ausculation Device pocket well healed; without hematoma or erythema.  There is no tethering Regular rate and rhythm, no murmurs gallops or rub Soft with active bowel sounds No clubbing cyanosis none Edema Alert and oriented, grossly normal motor and sensory function Skin Warm and Dry  ECG demonstrates atrial fibrillation with biventricular pacing  Assessment and  Plan

## 2013-08-26 NOTE — Assessment & Plan Note (Signed)
Device reprogrammed as noted below

## 2013-08-26 NOTE — Patient Instructions (Signed)
Your physician recommends that you continue on your current medications as directed. Please refer to the Current Medication list given to you today.  Remote monitoring is used to monitor your Pacemaker of ICD from home. This monitoring reduces the number of office visits required to check your device to one time per year. It allows us to keep an eye on the functioning of your device to ensure it is working properly. You are scheduled for a device check from home on 11/28/2013. You may send your transmission at any time that day. If you have a wireless device, the transmission will be sent automatically. After your physician reviews your transmission, you will receive a postcard with your next transmission date.  Your physician wants you to follow-up in: one year with Dr. Klein.  You will receive a reminder letter in the mail two months in advance. If you don't receive a letter, please call our office to schedule the follow-up appointment.    

## 2013-08-27 NOTE — Progress Notes (Signed)
CC:   [name]. [name], M.D. [name], MD, [facility]  DIAGNOSIS:  Hairy cell leukemia -- clinical remission.  CURRENT THERAPY:  Observation.  INTERIM HISTORY:  Mr. Martine comes in for followup.  He is doing fairly well.  He will have his 89th birthday tomorrow.  He and his family are celebrating this and Thanksgiving over the weekends.  He has had a very nice response to the 2-CDA.  He completed this back in March.  Since then, his anemia has resolved.  His white cell count has come up quite nicely.  Platelet count still is little on the low side, but I think this is going to be chronic.  He has had no problems with bleeding.  He does have a pacemaker in.  He sees Dr. Graciela Husbands on Friday for a followup.  PHYSICAL EXAMINATION:  General:  This is an elderly white gentleman, in no obvious distress.  Vital signs:  Temperature of 97.4, pulse 70, respiratory rate 18, blood pressure 127/60, weight is 191 pounds.  Head and neck:  Show a normocephalic, atraumatic skull.  There are no ocular or oral lesions.  There are no palpable cervical or supraclavicular lymph nodes.  Lungs:  Clear bilaterally.  Cardiac:  Regular rate and rhythm with normal S1 and S2.  He has a 2/6 systolic ejection murmur. Abdomen:  Soft.  He has good bowel sounds.  There is no fluid wave. There is no palpable abdominal mass.  There is no palpable hepatosplenomegaly.  Back:  No tenderness over the spine, ribs, or hips. Extremities:  Show osteoarthritic changes in his joints.  He has some slight muscle atrophy secondary to age.  He has no edema in his legs. Skin:  Shows some actinic keratoses.  LABORATORY STUDIES:  White cell count is 5.6, hemoglobin 13.4, hematocrit 40, platelet count 69,000.  IMPRESSION:  Mr. Kisling is a nice 79 year old gentleman with a history of hairy cell leukemia.  We gave him reduced dose 2-CDA.  He completed this back in March of 2014.  Again, his response has been remarkable.  His anemia has  resolved.  Despite him having chronic renal insufficiency, there are no problems with anemia.  With his age, I think we can probably get him through the holidays and the wintertime.  He has done incredibly well.  Again, I think that 4 months would be good for followup.    ______________________________ Josph Macho, M.D. PRE/MEDQ  D:  08/24/2013  T:  08/27/2013  Job:  [id number]

## 2013-08-29 LAB — MDC_IDC_ENUM_SESS_TYPE_INCLINIC
Battery Remaining Longevity: 43.2 mo
Implantable Pulse Generator Model: 3210
Lead Channel Impedance Value: 325 Ohm
Lead Channel Impedance Value: 750 Ohm
Lead Channel Pacing Threshold Amplitude: 1.625 V
Lead Channel Pacing Threshold Pulse Width: 0.5 ms
Lead Channel Pacing Threshold Pulse Width: 0.5 ms
Lead Channel Pacing Threshold Pulse Width: 0.8 ms
Lead Channel Sensing Intrinsic Amplitude: 1.7 mV
Lead Channel Setting Pacing Amplitude: 2 V
Lead Channel Setting Pacing Amplitude: 2.625
Lead Channel Setting Pacing Pulse Width: 0.5 ms

## 2013-09-02 LAB — FLOW CYTOMETRY - CHCC SATELLITE

## 2013-11-24 ENCOUNTER — Ambulatory Visit (INDEPENDENT_AMBULATORY_CARE_PROVIDER_SITE_OTHER): Payer: Medicare Other | Admitting: *Deleted

## 2013-11-24 ENCOUNTER — Encounter: Payer: Self-pay | Admitting: Internal Medicine

## 2013-11-24 DIAGNOSIS — I495 Sick sinus syndrome: Secondary | ICD-10-CM

## 2013-11-24 LAB — MDC_IDC_ENUM_SESS_TYPE_INCLINIC
Battery Remaining Longevity: 72 mo
Battery Voltage: 2.93 V
Brady Statistic RV Percent Paced: 96 %
Implantable Pulse Generator Serial Number: 2317387
Lead Channel Impedance Value: 512.5 Ohm
Lead Channel Pacing Threshold Amplitude: 1 V
Lead Channel Pacing Threshold Pulse Width: 0.5 ms
Lead Channel Pacing Threshold Pulse Width: 0.8 ms
Lead Channel Pacing Threshold Pulse Width: 0.8 ms
Lead Channel Setting Pacing Amplitude: 1.5 V
Lead Channel Setting Pacing Pulse Width: 0.5 ms
Lead Channel Setting Pacing Pulse Width: 0.8 ms
Lead Channel Setting Sensing Sensitivity: 2 mV
MDC IDC MSMT LEADCHNL LV IMPEDANCE VALUE: 537.5 Ohm
MDC IDC MSMT LEADCHNL LV PACING THRESHOLD AMPLITUDE: 1 V
MDC IDC MSMT LEADCHNL RV PACING THRESHOLD AMPLITUDE: 0.75 V
MDC IDC MSMT LEADCHNL RV SENSING INTR AMPL: 12 mV
MDC IDC SESS DTM: 20150219121112
MDC IDC SET LEADCHNL RV PACING AMPLITUDE: 2 V
MDC IDC STAT BRADY RA PERCENT PACED: 0 %

## 2013-11-24 NOTE — Progress Notes (Signed)
CRT-P device check in office for dizziness experienced after getting out of bed this am. Thresholds and sensing consistent with previous device measurements. Lead impedance trends stable over time. Permanent AF + Warfarin. No ventricular arrhythmia episodes recorded. Patient bi-ventricularly pacing 96% of the time. Device programmed with appropriate safety margins. No changes made this session. Orthostatics measured on patient which demonstrated a systolic drop of 20 mmHg and a diastolc drop of 10 mmHg when moving from a sitting to standing position---patient with sxms. Patient's lasix was put on hold by SK until pt sees Richardson Dopp, [place]. Patient denies CHF sxms---states DOE is chronic. Estimated longevity 6.0-6.5 years. Patient will follow up remotely on 5-26, with Richardson Dopp, [place] in 3-4 weeks, and with SK as scheduled.

## 2013-11-24 NOTE — Patient Instructions (Signed)
Your physician has recommended you make the following change in your medication: HOLD LASIX (FUROSEMIDE) UNTIL 3-4 [place], [place]  Your physician recommends that you schedule a follow-up appointment in: 3-4 [place]

## 2013-12-15 ENCOUNTER — Ambulatory Visit: Payer: Medicare Other | Admitting: Physician Assistant

## 2013-12-22 ENCOUNTER — Encounter: Payer: Self-pay | Admitting: Hematology & Oncology

## 2013-12-22 ENCOUNTER — Other Ambulatory Visit (HOSPITAL_BASED_OUTPATIENT_CLINIC_OR_DEPARTMENT_OTHER): Payer: Medicare Other | Admitting: Lab

## 2013-12-22 ENCOUNTER — Ambulatory Visit (HOSPITAL_BASED_OUTPATIENT_CLINIC_OR_DEPARTMENT_OTHER): Payer: Medicare Other | Admitting: Hematology & Oncology

## 2013-12-22 VITALS — BP 138/54 | HR 60 | Temp 97.5°F | Resp 20 | Wt 187.0 lb

## 2013-12-22 DIAGNOSIS — C9140 Hairy cell leukemia not having achieved remission: Secondary | ICD-10-CM

## 2013-12-22 LAB — CBC WITH DIFFERENTIAL (CANCER CENTER ONLY)
BASO#: 0 10*3/uL (ref 0.0–0.2)
BASO%: 0.4 % (ref 0.0–2.0)
EOS%: 1.4 % (ref 0.0–7.0)
Eosinophils Absolute: 0.1 10*3/uL (ref 0.0–0.5)
HCT: 41.2 % (ref 38.7–49.9)
HGB: 13.9 g/dL (ref 13.0–17.1)
LYMPH#: 0.5 10*3/uL — AB (ref 0.9–3.3)
LYMPH%: 10.6 % — ABNORMAL LOW (ref 14.0–48.0)
MCH: 30.5 pg (ref 28.0–33.4)
MCHC: 33.7 g/dL (ref 32.0–35.9)
MCV: 90 fL (ref 82–98)
MONO#: 0.4 10*3/uL (ref 0.1–0.9)
MONO%: 8.4 % (ref 0.0–13.0)
NEUT#: 4.1 10*3/uL (ref 1.5–6.5)
NEUT%: 79.2 % (ref 40.0–80.0)
PLATELETS: 68 10*3/uL — AB (ref 145–400)
RBC: 4.56 10*6/uL (ref 4.20–5.70)
RDW: 13.7 % (ref 11.1–15.7)
WBC: 5.1 10*3/uL (ref 4.0–10.0)

## 2013-12-22 LAB — CHCC SATELLITE - SMEAR

## 2013-12-22 NOTE — Progress Notes (Signed)
Hematology and Oncology Follow Up Visit  [name] [id number] 1921-05-16 80 y.o. 12/22/2013   Principle Diagnosis:   Hairy cell leukemia-clinical remission  Current Therapy:    Observation     Interim History:  Mr.  Rice is in for followup. We last saw him back in November. He got will holidays without any problems.  His heart is doing okay. He has a pacemaker and. This apparently was readjusted a few months ago.  He's not had any problems with fever. He got through the winter time without any problems with infection.  There is no bleeding. He's had no nausea vomiting. There's been no change in bowel or bladder habits.  There's been no cough. He's not noted any weight loss. He's had no swollen lymph glands.  Medications: Current outpatient prescriptions:carvedilol (COREG) 25 MG tablet, Take 25 mg by mouth once., Disp: , Rfl: ;  furosemide (LASIX) 20 MG tablet, Take 1 tablet (20 mg total) by mouth daily., Disp: 30 tablet, Rfl: 3;  mirtazapine (REMERON SOL-TAB) 15 MG disintegrating tablet, Take 15 mg by mouth at bedtime. , Disp: , Rfl: ;  NITROSTAT 0.4 MG SL tablet, Place 0.4 mg under the tongue as needed. , Disp: , Rfl:  omeprazole (PRILOSEC) 20 MG capsule, Take 20 mg by mouth daily. , Disp: , Rfl: ;  simvastatin (ZOCOR) 40 MG tablet, Take 40 mg by mouth at bedtime.  , Disp: , Rfl: ;  Tamsulosin HCl (FLOMAX) 0.4 MG CAPS, Take 0.4 mg by mouth daily.  , Disp: , Rfl: ;  vitamin B-12 (CYANOCOBALAMIN) 1000 MCG tablet, Take 1,000 mcg by mouth daily., Disp: , Rfl: ;  warfarin (COUMADIN) 5 MG tablet, Take 5 mg by mouth daily. , Disp: , Rfl:   Allergies: No Known Allergies  Past Medical History, Surgical history, Social history, and Family History were reviewed and updated.  Review of Systems: As above  Physical Exam:  weight is 187 lb (84.823 kg). His temperature is 97.5 F (36.4 C). His blood pressure is 138/54 and his pulse is 60. His respiration is 20.   Elderly white gentleman  in no obvious distress. Head and neck exam is negative. There is no scleral icterus. There is no adenopathy in the neck. Lungs are clear. Cardiac exam is regular rate and rhythm. Abdomen is soft. Has good bowel sounds. There is no fluid wave. There is no palpable hepato- splenomegaly extremities shows age-related osteoarthritic changes. He has good strength in his arms and legs. Skin exam shows age-related keratoses. He has some actinic keratoses.  Lab Results  Component Value Date   WBC 5.1 12/22/2013   HGB 13.9 12/22/2013   HCT 41.2 12/22/2013   MCV 90 12/22/2013   PLT 68* 12/22/2013     Chemistry      Component Value Date/Time   NA 141 08/24/2013 0950   NA 139 12/30/2012 1007   K 3.8 08/24/2013 0950   K 4.1 12/30/2012 1007   CL 101 08/24/2013 0950   CL 109 12/30/2012 1007   CO2 28 08/24/2013 0950   CO2 20 12/30/2012 1007   BUN 24* 08/24/2013 0950   BUN 27* 12/30/2012 1007   CREATININE 2.2* 08/24/2013 0950   CREATININE 1.61* 12/30/2012 1007      Component Value Date/Time   CALCIUM 9.6 08/24/2013 0950   CALCIUM 8.8 12/30/2012 1007   ALKPHOS 62 08/24/2013 0950   ALKPHOS 150* 12/30/2012 1007   AST 21 08/24/2013 0950   AST 17 12/30/2012 1007  ALT 23 08/24/2013 0950   ALT 15 12/30/2012 1007   BILITOT 1.10 08/24/2013 0950   BILITOT 0.8 12/30/2012 1007         Impression and Plan: [name] is 80 year old gentleman. He has hairy cell leukemia. He is in remission. We gave him a weekly 2-CDA. He completed this a year ago in March of 2014.  We will get him back in 6 months. I really think that he will stay in remission. I think the thrombocytopenia is not related to the hairy cell leukemia but just related to medications. As was possible that there may be some degree of myelodysplasia.   Volanda Napoleon, MD 3/19/201510:54 AM

## 2014-02-28 ENCOUNTER — Ambulatory Visit (INDEPENDENT_AMBULATORY_CARE_PROVIDER_SITE_OTHER): Payer: Medicare Other | Admitting: *Deleted

## 2014-02-28 DIAGNOSIS — I495 Sick sinus syndrome: Secondary | ICD-10-CM

## 2014-02-28 LAB — MDC_IDC_ENUM_SESS_TYPE_REMOTE
Battery Remaining Percentage: 59 %
Battery Voltage: 2.92 V
Date Time Interrogation Session: 20150526072810
Implantable Pulse Generator Serial Number: 2317387
Lead Channel Impedance Value: 490 Ohm
Lead Channel Impedance Value: 510 Ohm
Lead Channel Pacing Threshold Amplitude: 0.75 V
Lead Channel Pacing Threshold Amplitude: 1 V
Lead Channel Pacing Threshold Pulse Width: 0.5 ms
Lead Channel Pacing Threshold Pulse Width: 0.8 ms
Lead Channel Sensing Intrinsic Amplitude: 12 mV
Lead Channel Setting Pacing Amplitude: 1.5 V
Lead Channel Setting Sensing Sensitivity: 2 mV
MDC IDC MSMT BATTERY REMAINING LONGEVITY: 65 mo
MDC IDC SET LEADCHNL LV PACING PULSEWIDTH: 0.8 ms
MDC IDC SET LEADCHNL RV PACING AMPLITUDE: 2 V
MDC IDC SET LEADCHNL RV PACING PULSEWIDTH: 0.5 ms

## 2014-02-28 NOTE — Progress Notes (Signed)
Remote pacemaker transmission.   

## 2014-04-04 ENCOUNTER — Encounter: Payer: Self-pay | Admitting: Cardiology

## 2014-04-05 ENCOUNTER — Encounter: Payer: Self-pay | Admitting: Internal Medicine

## 2014-06-11 ENCOUNTER — Encounter (HOSPITAL_COMMUNITY): Payer: Self-pay | Admitting: Emergency Medicine

## 2014-06-11 ENCOUNTER — Emergency Department (HOSPITAL_COMMUNITY)
Admission: EM | Admit: 2014-06-11 | Discharge: 2014-06-11 | Disposition: A | Discharge status: Home / Self Care | Payer: Medicare Other | Attending: Emergency Medicine | Admitting: Emergency Medicine

## 2014-06-11 ENCOUNTER — Emergency Department (HOSPITAL_COMMUNITY): Payer: Medicare Other

## 2014-06-11 DIAGNOSIS — I4891 Unspecified atrial fibrillation: Secondary | ICD-10-CM | POA: Insufficient documentation

## 2014-06-11 DIAGNOSIS — Z85038 Personal history of other malignant neoplasm of large intestine: Secondary | ICD-10-CM | POA: Insufficient documentation

## 2014-06-11 DIAGNOSIS — Z7901 Long term (current) use of anticoagulants: Secondary | ICD-10-CM | POA: Diagnosis not present

## 2014-06-11 DIAGNOSIS — I2510 Atherosclerotic heart disease of native coronary artery without angina pectoris: Secondary | ICD-10-CM | POA: Diagnosis not present

## 2014-06-11 DIAGNOSIS — I10 Essential (primary) hypertension: Secondary | ICD-10-CM | POA: Insufficient documentation

## 2014-06-11 DIAGNOSIS — R2681 Unsteadiness on feet: Secondary | ICD-10-CM

## 2014-06-11 DIAGNOSIS — Z79899 Other long term (current) drug therapy: Secondary | ICD-10-CM | POA: Insufficient documentation

## 2014-06-11 DIAGNOSIS — K219 Gastro-esophageal reflux disease without esophagitis: Secondary | ICD-10-CM | POA: Insufficient documentation

## 2014-06-11 DIAGNOSIS — R269 Unspecified abnormalities of gait and mobility: Secondary | ICD-10-CM | POA: Diagnosis not present

## 2014-06-11 DIAGNOSIS — Z951 Presence of aortocoronary bypass graft: Secondary | ICD-10-CM | POA: Insufficient documentation

## 2014-06-11 DIAGNOSIS — Z8546 Personal history of malignant neoplasm of prostate: Secondary | ICD-10-CM | POA: Insufficient documentation

## 2014-06-11 DIAGNOSIS — R42 Dizziness and giddiness: Secondary | ICD-10-CM | POA: Insufficient documentation

## 2014-06-11 DIAGNOSIS — Z862 Personal history of diseases of the blood and blood-forming organs and certain disorders involving the immune mechanism: Secondary | ICD-10-CM | POA: Diagnosis not present

## 2014-06-11 LAB — COMPREHENSIVE METABOLIC PANEL
ALK PHOS: 78 U/L (ref 39–117)
ALT: 19 U/L (ref 0–53)
AST: 22 U/L (ref 0–37)
Albumin: 3.9 g/dL (ref 3.5–5.2)
Anion gap: 11 (ref 5–15)
BUN: 23 mg/dL (ref 6–23)
CALCIUM: 9.4 mg/dL (ref 8.4–10.5)
CO2: 25 mEq/L (ref 19–32)
Chloride: 108 mEq/L (ref 96–112)
Creatinine, Ser: 1.61 mg/dL — ABNORMAL HIGH (ref 0.50–1.35)
GFR calc non Af Amer: 36 mL/min — ABNORMAL LOW (ref >90)
GFR, EST AFRICAN AMERICAN: 42 mL/min — AB (ref >90)
GLUCOSE: 104 mg/dL — AB (ref 70–99)
Potassium: 4.4 mEq/L (ref 3.7–5.3)
Sodium: 144 mEq/L (ref 137–147)
TOTAL PROTEIN: 7.4 g/dL (ref 6.0–8.3)
Total Bilirubin: 1.1 mg/dL (ref 0.3–1.2)

## 2014-06-11 LAB — CBC
HEMATOCRIT: 42.5 % (ref 39.0–52.0)
HEMOGLOBIN: 14.1 g/dL (ref 13.0–17.0)
MCH: 30.1 pg (ref 26.0–34.0)
MCHC: 33.2 g/dL (ref 30.0–36.0)
MCV: 90.6 fL (ref 78.0–100.0)
Platelets: 56 10*3/uL — ABNORMAL LOW (ref 150–400)
RBC: 4.69 MIL/uL (ref 4.22–5.81)
RDW: 14.3 % (ref 11.5–15.5)
WBC: 4.4 10*3/uL (ref 4.0–10.5)

## 2014-06-11 LAB — PROTIME-INR
INR: 1.18 (ref 0.00–1.49)
Prothrombin Time: 15 seconds (ref 11.6–15.2)

## 2014-06-11 MED ORDER — SODIUM CHLORIDE 0.9 % IV BOLUS (SEPSIS)
500.0000 mL | Freq: Once | INTRAVENOUS | Status: AC
  Administered 2014-06-11: 500 mL via INTRAVENOUS

## 2014-06-11 NOTE — ED Notes (Signed)
Pt to department via EMS- reports that this morning he woke up and felt dizzy. States that he felt "swimmy headed" prior to ems arrival. Pt with a pacemaker. Bp-170/88 Hr-60 CBG-137. 20 left wrist. Orthostatic negative in route.

## 2014-06-11 NOTE — ED Notes (Signed)
Critical lab notification 56 platelets, MD notified.

## 2014-06-14 NOTE — ED Provider Notes (Signed)
CSN: [id number]     Arrival date & time 06/11/14  0855 History   First MD Initiated Contact with Patient 06/11/14 [phone]     Chief Complaint  Patient presents with  . Dizziness      HPI Patient reports feeling dizzy and feeling "swimmy headed".  He denies nausea vomiting.  No diarrhea.  He does take Coumadin.  No recent head injury.  He is on Coumadin for paroxysmal A. fib.  Denies melena or hematochezia.  No abdominal pain.  Wife reports no significant confusion.  Ambulatory.  He states he felt as a somewhat more off balance today when he was walking.  He normally walks with a cane.   Past Medical History  Diagnosis Date  . Atrial fibrillation     Paroxysmal, on Coumadin  . CAD (coronary artery disease)     CABGx6 10/2006  . Aortic stenosis     23-mm St. Jude Biocor porcine bioprosthetic at time of CABG 10/2006  . Hypertension   . Colon cancer     s/p colon resection  . Prostate cancer   . GERD (gastroesophageal reflux disease)   . Ischemic cardiomyopathy     EF 40% previously s/p BiV St. Jude PPM. Most recent echo 08/2009 - EF could not be estimated but appeared to be low-normal.  . Shortness of breath   . Anemia   . Blood transfusion     no hx of reaction to transfusions"  . Anemia, iron deficiency 11/20/2011  . Anemia of renal disease 11/20/2011  . Thrombocytopenia 11/20/2011  . Hairy cell leukemia 11/12/2012  . Diaphragmatic stimulation of the LV lead 08/26/2013   Past Surgical History  Procedure Laterality Date  . Insert / replace / remove pacemaker      biventricular, St. Jude  . Coronary artery bypass graft    . Aortic valve replacement    . Left eye cataract extraction    . Appendectomy    . Hemicolectomy  2000    right  . Cardiac surgery     Family History  Problem Relation Age of Onset  . Heart disease Mother    History  Substance Use Topics  . Smoking status: Former Smoker    Types: Pipe    Quit date: 07/23/1981  . Smokeless tobacco: Never Used  . Alcohol  Use: No    Review of Systems  All other systems reviewed and are negative.     Allergies  Review of patient's allergies indicates no known allergies.  Home Medications   Prior to Admission medications   Medication Sig Start Date End Date Taking? Authorizing Provider  carvedilol (COREG) 25 MG tablet Take 12.5 mg by mouth 2 (two) times daily with a meal.    Yes Historical Provider, MD  mirtazapine (REMERON SOL-TAB) 15 MG disintegrating tablet Take 15 mg by mouth at bedtime.  11/24/12  Yes Historical Provider, MD  omeprazole (PRILOSEC) 20 MG capsule Take 20 mg by mouth every morning.    Yes Historical Provider, MD  simvastatin (ZOCOR) 40 MG tablet Take 40 mg by mouth at bedtime.     Yes Historical Provider, MD  Tamsulosin HCl (FLOMAX) 0.4 MG CAPS Take 0.4 mg by mouth every evening.    Yes Historical Provider, MD  vitamin B-12 (CYANOCOBALAMIN) 1000 MCG tablet Take 1,000 mcg by mouth every morning.    Yes Historical Provider, MD  warfarin (COUMADIN) 5 MG tablet Take 2.5-5 mg by mouth every morning. Take 2.5 mg (half of a tablet)  on Mondays, Wednesdays, and Fridays.  On other days of the week, take 5 mg (a whole tablet)   Yes Historical Provider, MD  NITROSTAT 0.4 MG SL tablet Place 0.4 mg under the tongue as needed.  01/19/13   Historical Provider, MD   BP 166/80  Pulse 60  Temp(Src) 97.6 F (36.4 C) (Oral)  Resp 18  Wt 187 lb (84.823 kg)  SpO2 100% Physical Exam  Nursing note and vitals reviewed. Constitutional: He is oriented to person, place, and time. He appears well-developed and well-nourished.  HENT:  Head: Normocephalic and atraumatic.  Eyes: EOM are normal.  Neck: Normal range of motion.  Cardiovascular: Normal rate, regular rhythm, normal heart sounds and intact distal pulses.   Pulmonary/Chest: Effort normal and breath sounds normal. No respiratory distress.  Abdominal: Soft. He exhibits no distension. There is no tenderness.  Musculoskeletal: Normal range of motion.   Neurological: He is alert and oriented to person, place, and time.  Skin: Skin is warm and dry.  Psychiatric: He has a normal mood and affect. Judgment normal.    ED Course  Procedures (including critical care time) Labs Review Labs Reviewed  CBC - Abnormal; Notable for the following:    Platelets 56 (*)    All other components within normal limits  COMPREHENSIVE METABOLIC PANEL - Abnormal; Notable for the following:    Glucose, Bld 104 (*)    Creatinine, Ser 1.61 (*)    GFR calc non Af Amer 36 (*)    GFR calc Af Amer 42 (*)    All other components within normal limits  PROTIME-INR    Imaging Review CLINICAL DATA: Dizziness  EXAM:  CT HEAD WITHOUT CONTRAST  TECHNIQUE:  Contiguous axial images were obtained from the base of the skull  through the vertex without intravenous contrast.  COMPARISON: 11/26/2008  FINDINGS:  Calcification along the tentorium cerebelli may indicate remote  hemorrhage and is stable. Stable right frontal parietal  encephalomalacia again noted. Previously seen left caudate infarct  is not as well seen as on the prior exam. Mild cortical volume loss  noted with proportional ventricular prominence. Areas of  periventricular white matter hypodensity are most compatible with  small vessel ischemic change. No acute hemorrhage, infarct, or mass  lesion is identified. Globes are unremarkable. Mild paranasal sinus  mucoperiosteal thickening and left maxillary sinus mucous retention  cyst or polyp noted. No skull fracture.  IMPRESSION:  No acute intracranial finding.    EKG Interpretation   Date/Time:  Sunday June 11 2014 09:01:35 EDT Ventricular Rate:  60 PR Interval:    QRS Duration: 174 QT Interval:  512 QTC Calculation: 512 R Axis:   -86 Text Interpretation:  VENTRICULAR PACED RHYTHM No significant change was  found Confirmed by Hope Brandenburger  MD, Hevin Jeffcoat ([id number]) on 06/11/2014 9:11:03 AM      MDM   Final diagnoses:  Dizziness  Gait  instability    Patient is overall well-appearing.  Indeterminate emergency department.  Head CT is normal.  He is not a candidate for MRI secondary to his pacemaker.  My suspicion for central vertigo and stroke is low.  Discharge home in good condition   Hoy Morn, MD 06/14/14 [phone]

## 2014-06-22 ENCOUNTER — Other Ambulatory Visit (HOSPITAL_BASED_OUTPATIENT_CLINIC_OR_DEPARTMENT_OTHER): Payer: Medicare Other | Admitting: Lab

## 2014-06-22 ENCOUNTER — Encounter: Payer: Self-pay | Admitting: Hematology & Oncology

## 2014-06-22 ENCOUNTER — Ambulatory Visit (HOSPITAL_BASED_OUTPATIENT_CLINIC_OR_DEPARTMENT_OTHER): Payer: Medicare Other | Admitting: Hematology & Oncology

## 2014-06-22 VITALS — BP 160/74 | HR 59 | Temp 97.5°F | Resp 20 | Ht 69.0 in | Wt 180.0 lb

## 2014-06-22 DIAGNOSIS — C9140 Hairy cell leukemia not having achieved remission: Secondary | ICD-10-CM

## 2014-06-22 DIAGNOSIS — D696 Thrombocytopenia, unspecified: Secondary | ICD-10-CM

## 2014-06-22 LAB — CBC WITH DIFFERENTIAL (CANCER CENTER ONLY)
BASO#: 0 10*3/uL (ref 0.0–0.2)
BASO%: 0.2 % (ref 0.0–2.0)
EOS%: 2.2 % (ref 0.0–7.0)
Eosinophils Absolute: 0.1 10*3/uL (ref 0.0–0.5)
HCT: 41.5 % (ref 38.7–49.9)
HGB: 14 g/dL (ref 13.0–17.1)
LYMPH#: 0.7 10*3/uL — ABNORMAL LOW (ref 0.9–3.3)
LYMPH%: 14.3 % (ref 14.0–48.0)
MCH: 30.6 pg (ref 28.0–33.4)
MCHC: 33.7 g/dL (ref 32.0–35.9)
MCV: 91 fL (ref 82–98)
MONO#: 0.5 10*3/uL (ref 0.1–0.9)
MONO%: 9.1 % (ref 0.0–13.0)
NEUT#: 3.7 10*3/uL (ref 1.5–6.5)
NEUT%: 74.2 % (ref 40.0–80.0)
PLATELETS: 68 10*3/uL — AB (ref 145–400)
RBC: 4.58 10*6/uL (ref 4.20–5.70)
RDW: 14.1 % (ref 11.1–15.7)
WBC: 5 10*3/uL (ref 4.0–10.0)

## 2014-06-22 LAB — CHCC SATELLITE - SMEAR

## 2014-06-22 NOTE — Progress Notes (Signed)
Hematology and Oncology Follow Up Visit  [name] [id number] 12-Sep-1921 80 y.o. 06/22/2014   Principle Diagnosis:   Hairy cell leukemia- clinical  remission.  Chronic thrombocytopenia  Current Therapy:    Observation     Interim History:  Mr.  Rice is back for followup. I think this will probably be his last visit. He's been doing okay. He's had no problems as we last saw him. He has a pacemaker in. He is on Coumadin. He's had no bleeding.  He has the chronic thrombocytopenia. I really don't think this should be an issue for him with a Coumadin.  He's had no problems with his heart.  His appetite is doing pretty good. He's had no nausea or vomiting.  There has been no problems with bowels or bladder.  He's had no leg swelling.  Medications: Current outpatient prescriptions:carvedilol (COREG) 25 MG tablet, Take 12.5 mg by mouth 2 (two) times daily with a meal. , Disp: , Rfl: ;  mirtazapine (REMERON SOL-TAB) 15 MG disintegrating tablet, Take 15 mg by mouth at bedtime. , Disp: , Rfl: ;  Naproxen Sodium (ALEVE PO), Take by mouth as needed., Disp: , Rfl: ;  NITROSTAT 0.4 MG SL tablet, Place 0.4 mg under the tongue as needed. , Disp: , Rfl:  omeprazole (PRILOSEC) 20 MG capsule, Take 20 mg by mouth every morning. , Disp: , Rfl: ;  simvastatin (ZOCOR) 40 MG tablet, Take 40 mg by mouth at bedtime.  , Disp: , Rfl: ;  Tamsulosin HCl (FLOMAX) 0.4 MG CAPS, Take 0.4 mg by mouth every evening. , Disp: , Rfl: ;  vitamin B-12 (CYANOCOBALAMIN) 1000 MCG tablet, Take 1,000 mcg by mouth every morning. , Disp: , Rfl:  warfarin (COUMADIN) 5 MG tablet, Take 2.5-5 mg by mouth every morning. Take 2.5 mg (half of a tablet) on Mondays, Wednesdays, and Fridays.  On other days of the week, take 5 mg (a whole tablet), Disp: , Rfl:   Allergies: No Known Allergies  Past Medical History, Surgical history, Social history, and Family History were reviewed and updated.  Review of Systems: As  above  Physical Exam:  height is 5\' 9"  (1.753 m) and weight is 180 lb (81.647 kg). His oral temperature is 97.5 F (36.4 C). His blood pressure is 160/74 and his pulse is 59. His respiration is 20.   Elderly white gentleman. Head and neck exam shows no ocular or oral lesions. He is no adenopathy in neck. Lungs are clear. Cardiac exam regular in rhythm with no murmurs, rubs or bruits. Abdomen is soft. Has good bowel sounds. There is no fluid wave. There is no palpable liver or spleen tip. Extremities shows no clubbing, cyanosis or edema. He has a decent strength in his extremities. Skin exam shows some scattered ecchymoses. Neurological exam is nonfocal.  Lab Results  Component Value Date   WBC 5.0 06/22/2014   HGB 14.0 06/22/2014   HCT 41.5 06/22/2014   MCV 91 06/22/2014   PLT 68* 06/22/2014     Chemistry      Component Value Date/Time   NA 144 06/11/2014 0937   NA 141 08/24/2013 0950   K 4.4 06/11/2014 0937   K 3.8 08/24/2013 0950   CL 108 06/11/2014 0937   CL 101 08/24/2013 0950   CO2 25 06/11/2014 0937   CO2 28 08/24/2013 0950   BUN 23 06/11/2014 0937   BUN 24* 08/24/2013 0950   CREATININE 1.61* 06/11/2014 0937   CREATININE 2.2* 08/24/2013 0950  Component Value Date/Time   CALCIUM 9.4 06/11/2014 0937   CALCIUM 9.6 08/24/2013 0950   ALKPHOS 78 06/11/2014 0937   ALKPHOS 62 08/24/2013 0950   AST 22 06/11/2014 0937   AST 21 08/24/2013 0950   ALT 19 06/11/2014 0937   ALT 23 08/24/2013 0950   BILITOT 1.1 06/11/2014 0937   BILITOT 1.10 08/24/2013 0950         Impression and Plan: [name] is 80 year old gentleman. He has a history of hairy cell leukemia. He was treated with 2-CDA. He completed this back in March of 2014.  There is no evidence of hairy cell leukemia. We have done so cytology on his blood.  He has a chronic thrombocytopenia. Again, this should not be a problem for him. He has had this for a long time. This really has not gotten worse or better.  I think we will go ahead  and let him go from the practice. He's been doing very nicely.  It has been a pleasure to help take care of him.   Volanda Napoleon, MD 9/17/201511:17 AM

## 2014-08-29 ENCOUNTER — Encounter: Payer: Self-pay | Admitting: Internal Medicine

## 2014-08-29 ENCOUNTER — Ambulatory Visit (INDEPENDENT_AMBULATORY_CARE_PROVIDER_SITE_OTHER): Payer: Medicare Other | Admitting: Internal Medicine

## 2014-08-29 VITALS — BP 153/66 | HR 60 | Ht 69.0 in | Wt 189.0 lb

## 2014-08-29 DIAGNOSIS — I480 Paroxysmal atrial fibrillation: Secondary | ICD-10-CM

## 2014-08-29 DIAGNOSIS — Z45018 Encounter for adjustment and management of other part of cardiac pacemaker: Secondary | ICD-10-CM

## 2014-08-29 DIAGNOSIS — I495 Sick sinus syndrome: Secondary | ICD-10-CM

## 2014-08-29 LAB — MDC_IDC_ENUM_SESS_TYPE_INCLINIC
Battery Remaining Longevity: 64.8 mo
Battery Voltage: 2.9 V
Brady Statistic RA Percent Paced: 0 %
Brady Statistic RV Percent Paced: 95 %
Date Time Interrogation Session: 20151124151645
Implantable Pulse Generator Model: 3210
Implantable Pulse Generator Serial Number: 2317387
Lead Channel Impedance Value: 487.5 Ohm
Lead Channel Impedance Value: 512.5 Ohm
Lead Channel Pacing Threshold Amplitude: 0.625 V
Lead Channel Pacing Threshold Amplitude: 1 V
Lead Channel Pacing Threshold Pulse Width: 0.5 ms
Lead Channel Pacing Threshold Pulse Width: 0.8 ms
Lead Channel Sensing Intrinsic Amplitude: 1.9 mV
Lead Channel Setting Pacing Amplitude: 2 V
Lead Channel Setting Pacing Amplitude: 2 V
Lead Channel Setting Pacing Pulse Width: 0.5 ms
Lead Channel Setting Pacing Pulse Width: 0.8 ms
Lead Channel Setting Sensing Sensitivity: 2 mV

## 2014-08-29 NOTE — Progress Notes (Signed)
Patient Care Team: Horatio Pel, MD as PCP - General (Internal Medicine)   HPI  [name] is a 80 y.o. male is seen following CRT P. implantation for symptomatic bradycardia in this state of ischemic heart disease prior bypass surgery status post bioprosthetic aortic valve and ejection fraction of 40%.  He has now permanent atrial fibrillation   He has significant dyspnea on exertion. He was short of breath walking in from the elevator.   He is being treated for anemia attributable to renal insufficiency and some kind of dysplastic syndrome He has been intrcurrently diagnosed w hairy cell leukemia    Review of prior data demonstrates that his echo cardiogram 2012l demonstrated normal left ventricular function   Statin therapy is followed by Dr. Shelia Media    Past Medical History  Diagnosis Date  . Atrial fibrillation     Paroxysmal, on Coumadin  . CAD (coronary artery disease)     CABGx6 10/2006  . Aortic stenosis     23-mm St[employer] Jude Biocor porcine bioprosthetic at time of CABG 10/2006  . Hypertension   . Colon cancer     s/p colon resection  . Prostate cancer   . GERD (gastroesophageal reflux disease)   . Ischemic cardiomyopathy     EF 40% previously s/p BiV St. Jude PPM. Most recent echo 08/2009 - EF could not be estimated but appeared to be low-normal.  . Shortness of breath   . Anemia   . Blood transfusion     no hx of reaction to transfusions"  . Anemia, iron deficiency 11/20/2011  . Anemia of renal disease 11/20/2011  . Thrombocytopenia 11/20/2011  . Hairy cell leukemia 11/12/2012  . Diaphragmatic stimulation of the LV lead 08/26/2013    Past Surgical History  Procedure Laterality Date  . Insert / replace / remove pacemaker      biventricular, St. Jude  . Coronary artery bypass graft    . Aortic valve replacement    . Left eye cataract extraction    . Appendectomy    . Hemicolectomy  2000    right  . Cardiac surgery      Current  Outpatient Prescriptions  Medication Sig Dispense Refill  . carvedilol (COREG) 25 MG tablet Take 12.5 mg by mouth 2 (two) times daily with a meal.     . mirtazapine (REMERON SOL-TAB) 15 MG disintegrating tablet Take 15 mg by mouth at bedtime.     . Naproxen Sodium (ALEVE PO) Take by mouth as needed.    Marland Kitchen NITROSTAT 0.4 MG SL tablet Place 0.4 mg under the tongue as needed.     Marland Kitchen omeprazole (PRILOSEC) 20 MG capsule Take 20 mg by mouth every morning.     . simvastatin (ZOCOR) 40 MG tablet Take 40 mg by mouth at bedtime.      . Tamsulosin HCl (FLOMAX) 0.4 MG CAPS Take 0.4 mg by mouth every evening.     . vitamin B-12 (CYANOCOBALAMIN) 1000 MCG tablet Take 1,000 mcg by mouth every morning.     . warfarin (COUMADIN) 5 MG tablet Take 2.5-5 mg by mouth every morning. Take 2.5 mg (half of a tablet) on Mondays, Wednesdays, and Fridays.  On other days of the week, take 5 mg (a whole tablet)     No current facility-administered medications for this visit.    No Known Allergies  Review of Systems negative except from HPI and PMH  Physical Exam BP 153/66 mmHg  Pulse  60  Ht 5\' 9"  (1.753 m)  Wt 85.73 kg (189 lb)  BMI 27.90 kg/m2 Well developed and nourished in no acute distress HENT normal Neck supple  Clear Regular rate and rhythm, 2/6 mur Abd-soft with active BS No Clubbing cyanosis edema Skin-warm and dry A & Oriented  Grossly normal sensory and motor function    ECG demonstrates atrial fibrillation with biventricular pacing  Asses sment and  Plan  Dyspnea on exertion  Complete heart block  Symptomatic bradycardia  Hypertension  Pacemaker-St. Jude The patient's device was interrogated and the information was fully reviewed.  The device was reprogrammed to  activate rate response and increase in lower rate limit from 60--70  His dyspnea on exertion was markedly attenuated by [place] reprogramming his device as demonstrated by taking a walk.  Blood pressure was mildly elevated. Have  asked that he when he checks it at home it run  in the 135 range. We will leave it where it is.

## 2014-08-29 NOTE — Patient Instructions (Signed)

## 2014-09-06 IMAGING — CT CT HEAD W/O CM
2 series · 16 of 30 positions shown, 18 images · non-contrast
Comparison: 11/26/2008

CLINICAL DATA: Dizziness

EXAM:
CT HEAD WITHOUT CONTRAST
TECHNIQUE: Contiguous axial images were obtained from the base of the skull
through the vertex without intravenous contrast.

[Series 201: head w/o, idose (1) · axial · non-contrast · 0.43mm/px · z∈[+64,+189]mm · 8 of 33 slices shown, 10 images]
[im 4/33  brain]
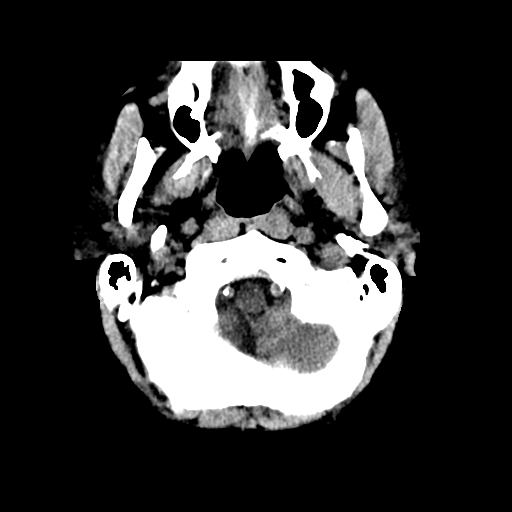
[im 4/33  bone]
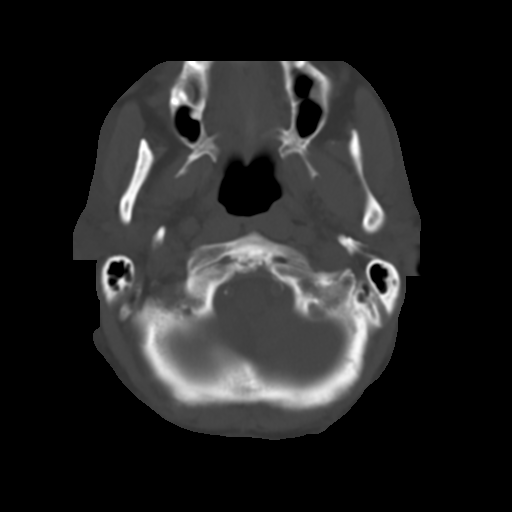
[im 8/33  brain]
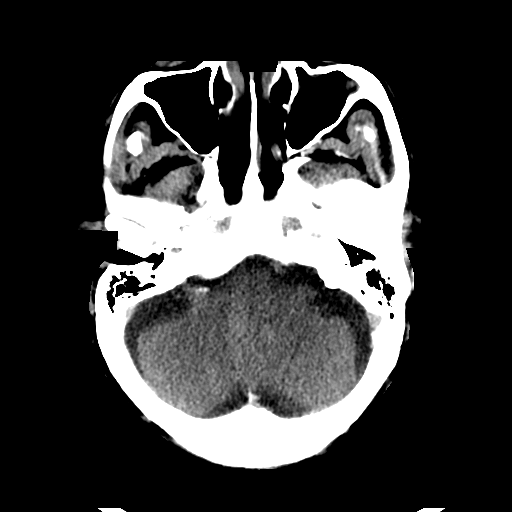
[im 11/33  brain]
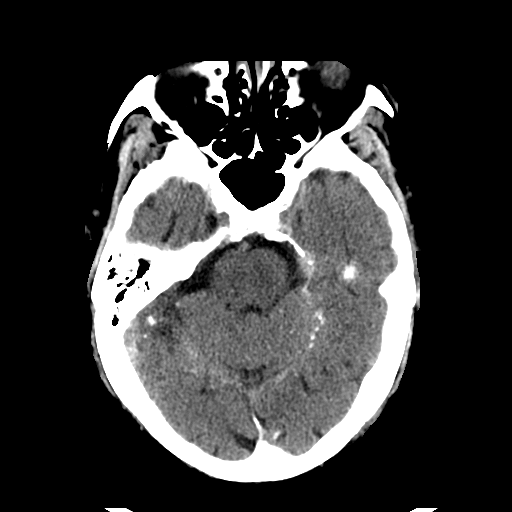
[im 15/33  brain]
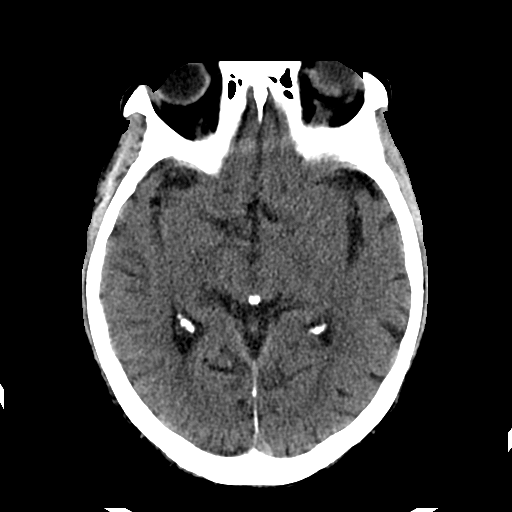
[im 18/33  brain]
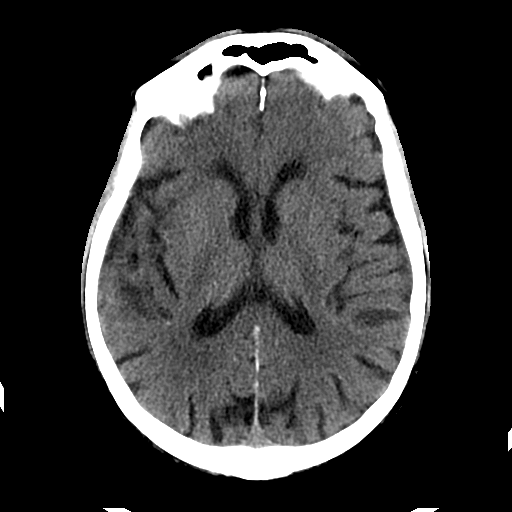
[im 18/33  bone]
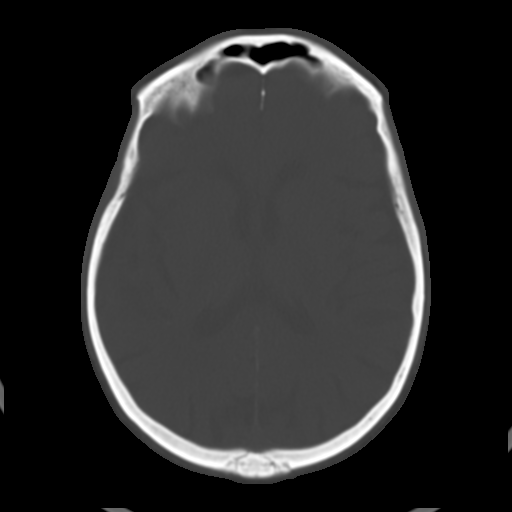
[im 22/33  brain]
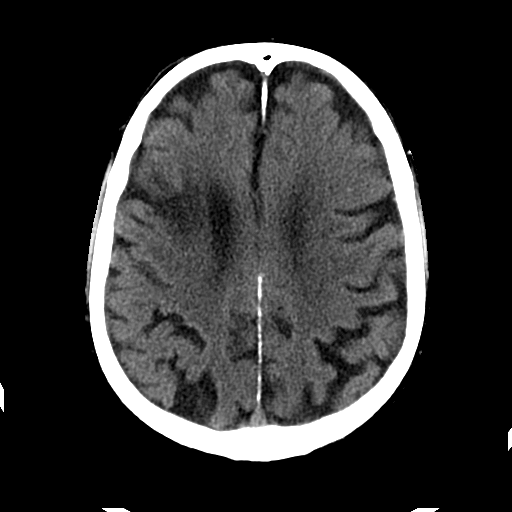
[im 25/33  brain]
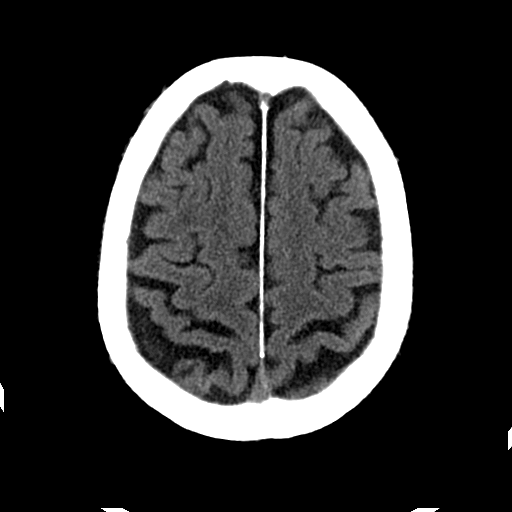
[im 29/33  brain]
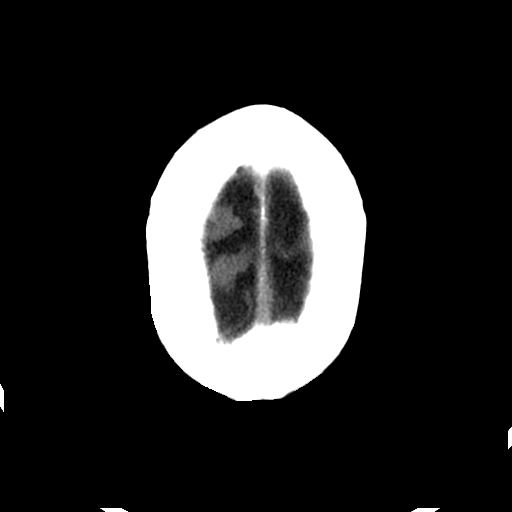

[Series 202: head w/o bone, idose (1) · axial · non-contrast · 0.43mm/px · z∈[+62,+192]mm · 8 of 66 slices shown]
[im 7/66  bone]
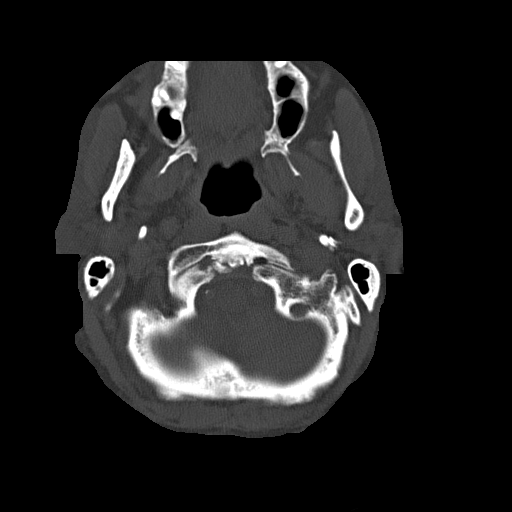
[im 14/66  bone]
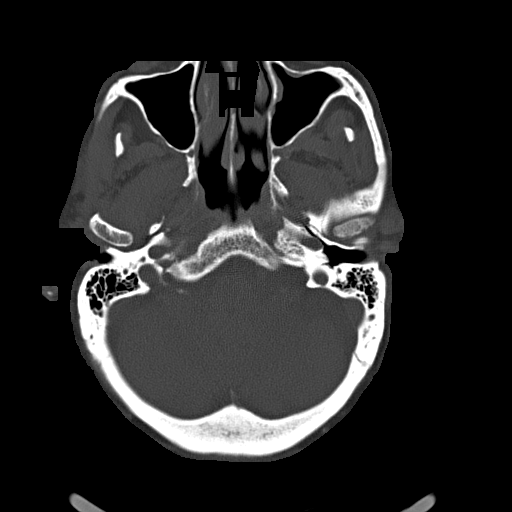
[im 21/66  bone]
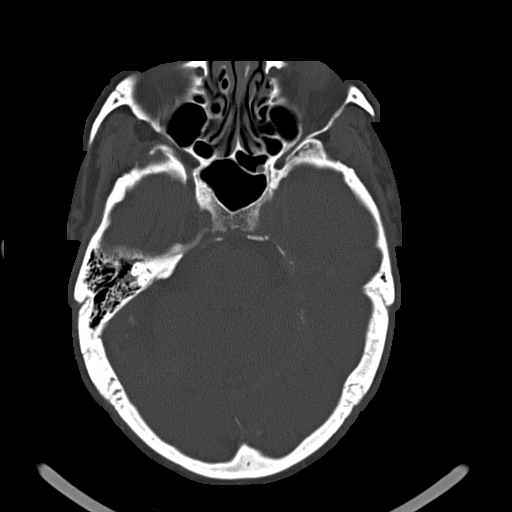
[im 28/66  bone]
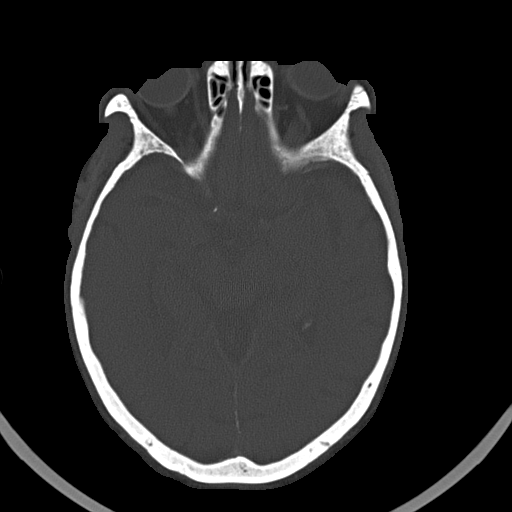
[im 38/66  bone]
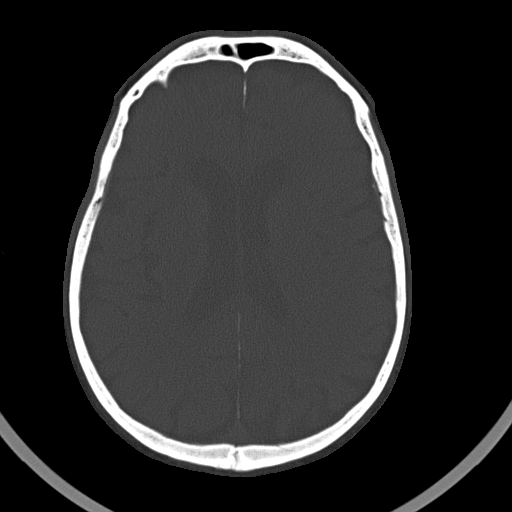
[im 45/66  bone]
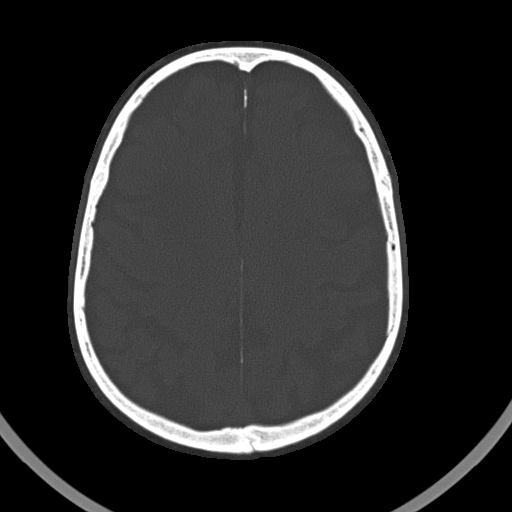
[im 52/66  bone]
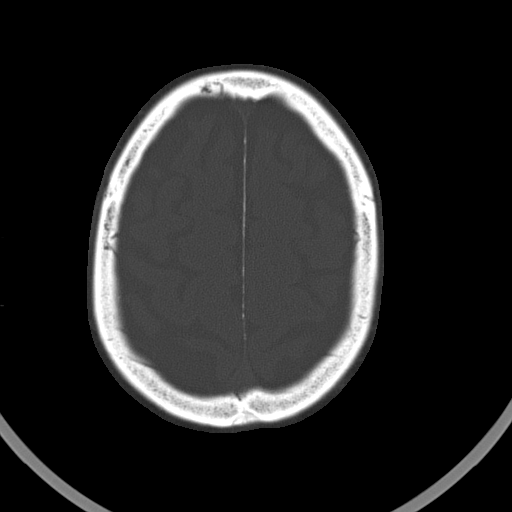
[im 59/66  bone]
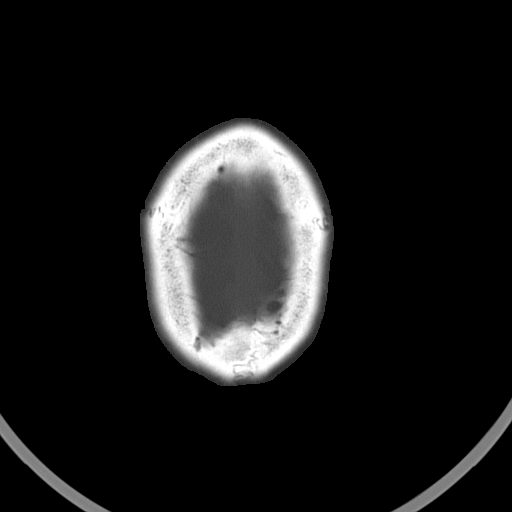

[16 of 30 positions shown; findings below may reference images not displayed]

FINDINGS: Calcification along the tentorium cerebelli may indicate remote
hemorrhage and is stable. Stable right frontal parietal
encephalomalacia again noted. Previously seen left caudate infarct
is not as well seen as on the prior exam. Mild cortical volume loss
noted with proportional ventricular prominence. Areas of
periventricular white matter hypodensity are most compatible with
small vessel ischemic change. No acute hemorrhage, infarct, or mass
lesion is identified. Globes are unremarkable. Mild paranasal sinus
mucoperiosteal thickening and left maxillary sinus mucous retention
cyst or polyp noted. No skull fracture.
IMPRESSION: No acute intracranial finding.

## 2014-11-10 ENCOUNTER — Other Ambulatory Visit: Payer: Self-pay | Admitting: Family

## 2014-11-16 DIAGNOSIS — I482 Chronic atrial fibrillation: Secondary | ICD-10-CM | POA: Diagnosis not present

## 2014-11-16 DIAGNOSIS — Z7901 Long term (current) use of anticoagulants: Secondary | ICD-10-CM | POA: Diagnosis not present

## 2014-11-16 DIAGNOSIS — I208 Other forms of angina pectoris: Secondary | ICD-10-CM | POA: Diagnosis not present

## 2014-11-29 ENCOUNTER — Ambulatory Visit (INDEPENDENT_AMBULATORY_CARE_PROVIDER_SITE_OTHER): Payer: Medicare Other | Admitting: *Deleted

## 2014-11-29 DIAGNOSIS — I495 Sick sinus syndrome: Secondary | ICD-10-CM | POA: Diagnosis not present

## 2014-11-29 LAB — MDC_IDC_ENUM_SESS_TYPE_REMOTE
Battery Remaining Longevity: 52 mo
Battery Voltage: 2.9 V
Brady Statistic RV Percent Paced: 85 %
Implantable Pulse Generator Model: 3210
Implantable Pulse Generator Serial Number: 2317387
Lead Channel Impedance Value: 490 Ohm
Lead Channel Impedance Value: 530 Ohm
Lead Channel Pacing Threshold Amplitude: 1 V
Lead Channel Pacing Threshold Pulse Width: 0.8 ms
Lead Channel Setting Pacing Amplitude: 2 V
Lead Channel Setting Pacing Amplitude: 2 V
Lead Channel Setting Pacing Pulse Width: 0.5 ms
Lead Channel Setting Pacing Pulse Width: 0.8 ms
Lead Channel Setting Sensing Sensitivity: 2 mV
MDC IDC MSMT BATTERY REMAINING PERCENTAGE: 53 %
MDC IDC MSMT LEADCHNL RV PACING THRESHOLD AMPLITUDE: 0.625 V
MDC IDC MSMT LEADCHNL RV PACING THRESHOLD PULSEWIDTH: 0.5 ms
MDC IDC MSMT LEADCHNL RV SENSING INTR AMPL: 12 mV
MDC IDC SESS DTM: 20160224071023

## 2014-11-29 NOTE — Progress Notes (Signed)
Remote pacemaker transmission.   

## 2014-12-20 ENCOUNTER — Encounter: Payer: Self-pay | Admitting: Internal Medicine

## 2014-12-26 DIAGNOSIS — N390 Urinary tract infection, site not specified: Secondary | ICD-10-CM | POA: Diagnosis not present

## 2014-12-26 DIAGNOSIS — R1909 Other intra-abdominal and pelvic swelling, mass and lump: Secondary | ICD-10-CM | POA: Diagnosis not present

## 2014-12-26 DIAGNOSIS — R319 Hematuria, unspecified: Secondary | ICD-10-CM | POA: Diagnosis not present

## 2014-12-27 ENCOUNTER — Encounter: Payer: Self-pay | Admitting: Hematology & Oncology

## 2014-12-28 DIAGNOSIS — R310 Gross hematuria: Secondary | ICD-10-CM | POA: Diagnosis not present

## 2014-12-28 DIAGNOSIS — K4090 Unilateral inguinal hernia, without obstruction or gangrene, not specified as recurrent: Secondary | ICD-10-CM | POA: Diagnosis not present

## 2014-12-28 DIAGNOSIS — Z8546 Personal history of malignant neoplasm of prostate: Secondary | ICD-10-CM | POA: Diagnosis not present

## 2015-01-03 ENCOUNTER — Other Ambulatory Visit: Payer: Medicare Other

## 2015-01-03 ENCOUNTER — Ambulatory Visit: Payer: Medicare Other | Admitting: Family

## 2015-01-04 ENCOUNTER — Telehealth: Payer: Self-pay | Admitting: Hematology & Oncology

## 2015-01-04 NOTE — Telephone Encounter (Signed)
Pat Case Manager from referring called to see when pt was coming in. I left her message that pt had cx 3-30 on Monday, and to call if she had any questions

## 2015-01-05 DIAGNOSIS — K8020 Calculus of gallbladder without cholecystitis without obstruction: Secondary | ICD-10-CM | POA: Diagnosis not present

## 2015-01-05 DIAGNOSIS — K4090 Unilateral inguinal hernia, without obstruction or gangrene, not specified as recurrent: Secondary | ICD-10-CM | POA: Diagnosis not present

## 2015-01-05 DIAGNOSIS — N3289 Other specified disorders of bladder: Secondary | ICD-10-CM | POA: Diagnosis not present

## 2015-01-05 DIAGNOSIS — R310 Gross hematuria: Secondary | ICD-10-CM | POA: Diagnosis not present

## 2015-01-09 DIAGNOSIS — R310 Gross hematuria: Secondary | ICD-10-CM | POA: Diagnosis not present

## 2015-01-23 DIAGNOSIS — Z7901 Long term (current) use of anticoagulants: Secondary | ICD-10-CM | POA: Diagnosis not present

## 2015-01-23 DIAGNOSIS — K4090 Unilateral inguinal hernia, without obstruction or gangrene, not specified as recurrent: Secondary | ICD-10-CM | POA: Diagnosis not present

## 2015-01-23 DIAGNOSIS — I482 Chronic atrial fibrillation: Secondary | ICD-10-CM | POA: Diagnosis not present

## 2015-02-08 DIAGNOSIS — R351 Nocturia: Secondary | ICD-10-CM | POA: Diagnosis not present

## 2015-02-08 DIAGNOSIS — N400 Enlarged prostate without lower urinary tract symptoms: Secondary | ICD-10-CM | POA: Diagnosis not present

## 2015-02-08 DIAGNOSIS — Z7689 Persons encountering health services in other specified circumstances: Secondary | ICD-10-CM | POA: Diagnosis not present

## 2015-02-08 DIAGNOSIS — N390 Urinary tract infection, site not specified: Secondary | ICD-10-CM | POA: Diagnosis not present

## 2015-02-08 DIAGNOSIS — R358 Other polyuria: Secondary | ICD-10-CM | POA: Diagnosis not present

## 2015-03-01 ENCOUNTER — Ambulatory Visit (INDEPENDENT_AMBULATORY_CARE_PROVIDER_SITE_OTHER): Payer: Medicare Other | Admitting: *Deleted

## 2015-03-01 DIAGNOSIS — I495 Sick sinus syndrome: Secondary | ICD-10-CM

## 2015-03-01 NOTE — Progress Notes (Signed)
Remote pacemaker transmission.   

## 2015-03-08 DIAGNOSIS — Z7901 Long term (current) use of anticoagulants: Secondary | ICD-10-CM | POA: Diagnosis not present

## 2015-03-08 DIAGNOSIS — I482 Chronic atrial fibrillation: Secondary | ICD-10-CM | POA: Diagnosis not present

## 2015-03-09 LAB — CUP PACEART REMOTE DEVICE CHECK
Battery Remaining Longevity: 47 mo
Battery Remaining Percentage: 48 %
Battery Voltage: 2.89 V
Date Time Interrogation Session: 20160526074459
Lead Channel Impedance Value: 480 Ohm
Lead Channel Impedance Value: 530 Ohm
Lead Channel Pacing Threshold Amplitude: 0.75 V
Lead Channel Pacing Threshold Amplitude: 1 V
Lead Channel Pacing Threshold Pulse Width: 0.5 ms
Lead Channel Pacing Threshold Pulse Width: 0.8 ms
Lead Channel Sensing Intrinsic Amplitude: 12 mV
Lead Channel Setting Pacing Amplitude: 2 V
Lead Channel Setting Pacing Pulse Width: 0.8 ms
Lead Channel Setting Sensing Sensitivity: 2 mV
MDC IDC PG SERIAL: 2317387
MDC IDC SET LEADCHNL LV PACING AMPLITUDE: 2 V
MDC IDC SET LEADCHNL RV PACING PULSEWIDTH: 0.5 ms
Pulse Gen Model: 3210

## 2015-03-28 ENCOUNTER — Other Ambulatory Visit: Payer: Self-pay | Admitting: Dermatology

## 2015-03-28 ENCOUNTER — Encounter: Payer: Self-pay | Admitting: Cardiology

## 2015-03-28 DIAGNOSIS — C44311 Basal cell carcinoma of skin of nose: Secondary | ICD-10-CM | POA: Diagnosis not present

## 2015-04-02 ENCOUNTER — Encounter: Payer: Self-pay | Admitting: Internal Medicine

## 2015-04-24 DIAGNOSIS — I482 Chronic atrial fibrillation: Secondary | ICD-10-CM | POA: Diagnosis not present

## 2015-04-24 DIAGNOSIS — Z7901 Long term (current) use of anticoagulants: Secondary | ICD-10-CM | POA: Diagnosis not present

## 2015-05-31 ENCOUNTER — Ambulatory Visit (INDEPENDENT_AMBULATORY_CARE_PROVIDER_SITE_OTHER): Payer: Medicare Other | Admitting: *Deleted

## 2015-05-31 DIAGNOSIS — I495 Sick sinus syndrome: Secondary | ICD-10-CM

## 2015-05-31 LAB — CUP PACEART REMOTE DEVICE CHECK
Battery Voltage: 2.89 V
Date Time Interrogation Session: 20160825061747
Lead Channel Pacing Threshold Amplitude: 0.75 V
Lead Channel Pacing Threshold Amplitude: 1 V
Lead Channel Pacing Threshold Pulse Width: 0.5 ms
Lead Channel Pacing Threshold Pulse Width: 0.8 ms
Lead Channel Sensing Intrinsic Amplitude: 12 mV
Lead Channel Setting Pacing Amplitude: 2 V
Lead Channel Setting Pacing Amplitude: 2 V
Lead Channel Setting Pacing Pulse Width: 0.5 ms
Lead Channel Setting Pacing Pulse Width: 0.8 ms
Lead Channel Setting Sensing Sensitivity: 2 mV
MDC IDC MSMT BATTERY REMAINING LONGEVITY: 54 mo
MDC IDC MSMT BATTERY REMAINING PERCENTAGE: 57 %
MDC IDC MSMT LEADCHNL LV IMPEDANCE VALUE: 540 Ohm
MDC IDC MSMT LEADCHNL RV IMPEDANCE VALUE: 490 Ohm
MDC IDC PG SERIAL: 2317387

## 2015-05-31 NOTE — Progress Notes (Signed)
Remote pacemaker transmission.   

## 2015-06-01 DIAGNOSIS — E780 Pure hypercholesterolemia: Secondary | ICD-10-CM | POA: Diagnosis not present

## 2015-06-01 DIAGNOSIS — I10 Essential (primary) hypertension: Secondary | ICD-10-CM | POA: Diagnosis not present

## 2015-06-06 DIAGNOSIS — Z7901 Long term (current) use of anticoagulants: Secondary | ICD-10-CM | POA: Diagnosis not present

## 2015-06-06 DIAGNOSIS — E780 Pure hypercholesterolemia: Secondary | ICD-10-CM | POA: Diagnosis not present

## 2015-06-06 DIAGNOSIS — I482 Chronic atrial fibrillation: Secondary | ICD-10-CM | POA: Diagnosis not present

## 2015-06-06 DIAGNOSIS — Z0000 Encounter for general adult medical examination without abnormal findings: Secondary | ICD-10-CM | POA: Diagnosis not present

## 2015-06-07 DIAGNOSIS — Z7901 Long term (current) use of anticoagulants: Secondary | ICD-10-CM | POA: Diagnosis not present

## 2015-06-07 DIAGNOSIS — I482 Chronic atrial fibrillation: Secondary | ICD-10-CM | POA: Diagnosis not present

## 2015-06-15 ENCOUNTER — Encounter: Payer: Self-pay | Admitting: *Deleted

## 2015-06-27 ENCOUNTER — Encounter: Payer: Self-pay | Admitting: Internal Medicine

## 2015-07-19 DIAGNOSIS — Z7901 Long term (current) use of anticoagulants: Secondary | ICD-10-CM | POA: Diagnosis not present

## 2015-07-19 DIAGNOSIS — Z23 Encounter for immunization: Secondary | ICD-10-CM | POA: Diagnosis not present

## 2015-07-19 DIAGNOSIS — I482 Chronic atrial fibrillation: Secondary | ICD-10-CM | POA: Diagnosis not present

## 2015-09-06 DIAGNOSIS — Z7901 Long term (current) use of anticoagulants: Secondary | ICD-10-CM | POA: Diagnosis not present

## 2015-09-06 DIAGNOSIS — I482 Chronic atrial fibrillation: Secondary | ICD-10-CM | POA: Diagnosis not present

## 2015-09-11 ENCOUNTER — Encounter: Payer: Self-pay | Admitting: Internal Medicine

## 2015-09-11 ENCOUNTER — Ambulatory Visit (INDEPENDENT_AMBULATORY_CARE_PROVIDER_SITE_OTHER): Payer: Medicare Other | Admitting: Internal Medicine

## 2015-09-11 VITALS — BP 140/70 | HR 74 | Ht 69.0 in | Wt 178.0 lb

## 2015-09-11 DIAGNOSIS — I482 Chronic atrial fibrillation: Secondary | ICD-10-CM | POA: Diagnosis not present

## 2015-09-11 DIAGNOSIS — I119 Hypertensive heart disease without heart failure: Secondary | ICD-10-CM | POA: Diagnosis not present

## 2015-09-11 DIAGNOSIS — Z950 Presence of cardiac pacemaker: Secondary | ICD-10-CM

## 2015-09-11 DIAGNOSIS — I495 Sick sinus syndrome: Secondary | ICD-10-CM

## 2015-09-11 DIAGNOSIS — I5032 Chronic diastolic (congestive) heart failure: Secondary | ICD-10-CM | POA: Diagnosis not present

## 2015-09-11 DIAGNOSIS — I442 Atrioventricular block, complete: Secondary | ICD-10-CM

## 2015-09-11 DIAGNOSIS — I4821 Permanent atrial fibrillation: Secondary | ICD-10-CM

## 2015-09-11 DIAGNOSIS — Z45018 Encounter for adjustment and management of other part of cardiac pacemaker: Secondary | ICD-10-CM

## 2015-09-11 LAB — CUP PACEART INCLINIC DEVICE CHECK
Date Time Interrogation Session: 20161208094439
Implantable Lead Implant Date: 20101122
Implantable Lead Implant Date: 20101122
Implantable Lead Location: 753860
Lead Channel Setting Pacing Amplitude: 2 V
Lead Channel Setting Sensing Sensitivity: 2 mV
MDC IDC LEAD IMPLANT DT: 20101122
MDC IDC LEAD LOCATION: 753858
MDC IDC LEAD LOCATION: 753859
MDC IDC PG SERIAL: 2317387
MDC IDC SET LEADCHNL LV PACING PULSEWIDTH: 0.8 ms
MDC IDC SET LEADCHNL RV PACING AMPLITUDE: 2 V
MDC IDC SET LEADCHNL RV PACING PULSEWIDTH: 0.5 ms

## 2015-09-11 NOTE — Patient Instructions (Signed)
Medication Instructions: - no changes today  - please let us know which blood thinner you would like to start and we will call this in for youNira Conn, RN for Dr. Caryl Comes [phone].  Labwork: - none  Procedures/Testing: - none  Follow-Up: - Remote monitoring is used to monitor your Pacemaker of ICD from home. This monitoring reduces the number of office visits required to check your device to one time per year. It allows [place] to keep an eye on the functioning of your device to ensure it is working properly. You are scheduled for a device check from home on 12/11/15. You may send your transmission at any time that day. If you have a wireless device, the transmission will be sent automatically. After your physician reviews your transmission, you will receive a postcard with your next transmission date.  - Your physician wants you to follow-up in: 1 year with Dr. Gari Crown will receive a reminder letter in the mail two months in advance. If you don't receive a letter, please call our office to schedule the follow-up appointment.  Any Additional Special Instructions Will Be Listed Below (If Applicable).

## 2015-09-11 NOTE — Progress Notes (Signed)
Patient Care Team: Deland Pretty, MD as PCP - General (Internal Medicine)   HPI  [name] is a 81 y.o. male is seen following CRT P. implantation for symptomatic bradycardia in this state of ischemic heart disease prior bypass surgery status post bioprosthetic aortic valve and ejection fraction of 40%.  He has now permanent atrial fibrillation   He has significant dyspnea on exertion. He was short of breath walking in from the elevator.   He is being treated for anemia attributable to renal insufficiency and some kind of dysplastic syndrome He has been intrcurrently diagnosed w hairy cell leukemia    Review of prior data demonstrates that his echo cardiogram 2012l demonstrated normal left ventricular function   Statin therapy is followed by Dr. Shelia Media    Past Medical History  Diagnosis Date  . Atrial fibrillation     Paroxysmal, on Coumadin  . CAD (coronary artery disease)     CABGx6 10/2006  . Aortic stenosis     23-mm St[employer] Jude Biocor porcine bioprosthetic at time of CABG 10/2006  . Hypertension   . Colon cancer     s/p colon resection  . Prostate cancer   . GERD (gastroesophageal reflux disease)   . Ischemic cardiomyopathy     EF 40% previously s/p BiV St. Jude PPM. Most recent echo 08/2009 - EF could not be estimated but appeared to be low-normal.  . Shortness of breath   . Anemia   . Blood transfusion     no hx of reaction to transfusions"  . Anemia, iron deficiency 11/20/2011  . Anemia of renal disease 11/20/2011  . Thrombocytopenia 11/20/2011  . Hairy cell leukemia 11/12/2012  . Diaphragmatic stimulation of the LV lead 08/26/2013    Past Surgical History  Procedure Laterality Date  . Insert / replace / remove pacemaker      biventricular, [employer]. Jude  . Coronary artery bypass graft    . Aortic valve replacement    . Left eye cataract extraction    . Appendectomy    . Hemicolectomy  2000    right  . Cardiac surgery      Current Outpatient  Prescriptions  Medication Sig Dispense Refill  . carvedilol (COREG) 25 MG tablet Take 12.5 mg by mouth 2 (two) times daily with a meal.     . mirtazapine (REMERON SOL-TAB) 15 MG disintegrating tablet Take 15 mg by mouth at bedtime.     . Naproxen Sodium (ALEVE PO) Take by mouth as needed.    Marland Kitchen NITROSTAT 0.4 MG SL tablet Place 0.4 mg under the tongue as needed.     Marland Kitchen omeprazole (PRILOSEC) 20 MG capsule Take 20 mg by mouth every morning.     . simvastatin (ZOCOR) 40 MG tablet Take 40 mg by mouth at bedtime.      . Tamsulosin HCl (FLOMAX) 0.4 MG CAPS Take 0.4 mg by mouth every evening.     . vitamin B-12 (CYANOCOBALAMIN) 1000 MCG tablet Take 1,000 mcg by mouth every morning.     . warfarin (COUMADIN) 5 MG tablet Take 2.5-5 mg by mouth every morning. Take 2.5 mg (half of a tablet) on Mondays, Wednesdays, and Fridays.  On other days of the week, take 5 mg (a whole tablet)     No current facility-administered medications for this visit.    No Known Allergies  Review of Systems negative except from HPI and PMH  Physical Exam There were no vitals taken for  this visit. Well developed and nourished in no acute distress HENT normal Neck supple  Clear Regular rate and rhythm, 2/6 mur Abd-soft with active BS No Clubbing cyanosis edema Skin-warm and dry A & Oriented  Grossly normal sensory and motor function    ECG demonstrates atrial fibrillation with biventricular pacing  Asses sment and  Plan  Dyspnea on exertion  Atrial fibrillation-permanent  Complete heart block  Hypertension  Pacemaker-St. Jude

## 2015-09-12 ENCOUNTER — Telehealth: Payer: Self-pay | Admitting: Internal Medicine

## 2015-09-12 NOTE — Telephone Encounter (Signed)
Aris Georgia, [place], RN           Once INR < 3.0, start Xarelto 15mg  daily with food. (CrCl 33 based on SCr from last year)

## 2015-09-12 NOTE — Telephone Encounter (Signed)
New Message    Pt calling stating that he was suppose to call back to let Nira Conn know which medication he wanted to try to start taking. Please call back and advise.

## 2015-09-12 NOTE — Telephone Encounter (Signed)
I spoke with the patient. He is wanting to start Xarelto. Message sent to [facility] and clarify dose and how to transition off warfarin.

## 2015-09-13 NOTE — Telephone Encounter (Signed)
Reviewed with Dr. Caryl Comes as coumadin is not followed in our office. Per Dr. Caryl Comes, if the patient was not told to change his coumadin dose at his last check, then he may hold warfarin x 2 nights, then start Xarelto 15 mg once daily. If he was high at his last check, he will need to have this rechecked to insure his level is <3.0 He will need samples pulled for him. Will notify patient.

## 2015-09-14 ENCOUNTER — Telehealth: Payer: Self-pay

## 2015-09-14 MED ORDER — RIVAROXABAN 15 MG PO TABS: 15.0000 mg | ORAL_TABLET | Freq: Two times a day (BID) | ORAL | Status: DC

## 2015-09-14 NOTE — Telephone Encounter (Signed)
Advised patient of below instructions. Samples left at front desk for patient to pick up today/Monday.  He is aware to hold Coumadin x2 days prior to beginning Xarelto. Rx sent to [facility]. Patient verbalized understanding and agreeable to plan.

## 2015-09-14 NOTE — Telephone Encounter (Signed)
Prior auth for Xarelto 15mg sent to [facility]. 

## 2015-09-20 ENCOUNTER — Telehealth: Payer: Self-pay | Admitting: Internal Medicine

## 2015-09-20 NOTE — Telephone Encounter (Signed)
New message       Talk to the nurse----ins denied xarelto 15mg .  He thinks he needs prior approval

## 2015-09-20 NOTE — Telephone Encounter (Signed)
Spoke with patient about this prior auth. Advised him that [facility] called me and we did an appeal over the phone. Gave them all infor about his porcine valve. They will let us know if approved or denied in a few days. Patient is remaining on Coumadin.

## 2015-10-03 ENCOUNTER — Telehealth: Payer: Self-pay

## 2015-10-03 NOTE — Telephone Encounter (Signed)
Appeal for Xarelto denied by [facility], on the basis that the FDA does not approve the drug for patients with artificial heart valves. Will forward to Dr. Caryl Comes. Patient knows that he must stay on Coumadin.

## 2015-10-03 NOTE — Telephone Encounter (Signed)
I guess the insurance company will make the decision. Many people are using these drugs for people who have tissue valves

## 2015-10-04 ENCOUNTER — Other Ambulatory Visit: Payer: Self-pay

## 2015-10-18 DIAGNOSIS — I482 Chronic atrial fibrillation: Secondary | ICD-10-CM | POA: Diagnosis not present

## 2015-10-18 DIAGNOSIS — Z7901 Long term (current) use of anticoagulants: Secondary | ICD-10-CM | POA: Diagnosis not present

## 2015-12-04 DIAGNOSIS — I5032 Chronic diastolic (congestive) heart failure: Secondary | ICD-10-CM | POA: Diagnosis not present

## 2015-12-04 DIAGNOSIS — I482 Chronic atrial fibrillation: Secondary | ICD-10-CM | POA: Diagnosis not present

## 2015-12-04 DIAGNOSIS — I2510 Atherosclerotic heart disease of native coronary artery without angina pectoris: Secondary | ICD-10-CM | POA: Diagnosis not present

## 2015-12-11 ENCOUNTER — Ambulatory Visit (INDEPENDENT_AMBULATORY_CARE_PROVIDER_SITE_OTHER): Payer: Medicare Other | Admitting: *Deleted

## 2015-12-11 DIAGNOSIS — I495 Sick sinus syndrome: Secondary | ICD-10-CM | POA: Diagnosis not present

## 2015-12-11 DIAGNOSIS — I442 Atrioventricular block, complete: Secondary | ICD-10-CM

## 2015-12-14 NOTE — Progress Notes (Signed)
Remote pacemaker transmission.   

## 2015-12-15 LAB — CUP PACEART REMOTE DEVICE CHECK
Battery Remaining Longevity: 44 mo
Battery Remaining Percentage: 46 %
Implantable Lead Implant Date: 20101122
Implantable Lead Implant Date: 20101122
Implantable Lead Location: 753858
Implantable Lead Location: 753860
Lead Channel Pacing Threshold Amplitude: 1 V
Lead Channel Pacing Threshold Pulse Width: 0.8 ms
Lead Channel Sensing Intrinsic Amplitude: 12 mV
Lead Channel Setting Pacing Amplitude: 2 V
Lead Channel Setting Sensing Sensitivity: 2 mV
MDC IDC LEAD IMPLANT DT: 20101122
MDC IDC LEAD LOCATION: 753859
MDC IDC MSMT BATTERY VOLTAGE: 2.86 V
MDC IDC MSMT LEADCHNL LV IMPEDANCE VALUE: 560 Ohm
MDC IDC MSMT LEADCHNL RV IMPEDANCE VALUE: 510 Ohm
MDC IDC MSMT LEADCHNL RV PACING THRESHOLD AMPLITUDE: 0.75 V
MDC IDC MSMT LEADCHNL RV PACING THRESHOLD PULSEWIDTH: 0.5 ms
MDC IDC SESS DTM: 20170307072833
MDC IDC SET LEADCHNL LV PACING AMPLITUDE: 2 V
MDC IDC SET LEADCHNL LV PACING PULSEWIDTH: 0.8 ms
MDC IDC SET LEADCHNL RV PACING PULSEWIDTH: 0.5 ms
Pulse Gen Serial Number: 2317387

## 2015-12-15 NOTE — Progress Notes (Signed)
Normal remote reviewed.  Next Merlin 03/11/16 

## 2015-12-18 DIAGNOSIS — Z7901 Long term (current) use of anticoagulants: Secondary | ICD-10-CM | POA: Diagnosis not present

## 2015-12-18 DIAGNOSIS — I482 Chronic atrial fibrillation: Secondary | ICD-10-CM | POA: Diagnosis not present

## 2015-12-19 ENCOUNTER — Encounter: Payer: Self-pay | Admitting: Cardiology

## 2016-02-21 DIAGNOSIS — Z7901 Long term (current) use of anticoagulants: Secondary | ICD-10-CM | POA: Diagnosis not present

## 2016-02-21 DIAGNOSIS — I482 Chronic atrial fibrillation: Secondary | ICD-10-CM | POA: Diagnosis not present

## 2016-03-11 ENCOUNTER — Ambulatory Visit (INDEPENDENT_AMBULATORY_CARE_PROVIDER_SITE_OTHER): Payer: Medicare Other | Admitting: *Deleted

## 2016-03-11 DIAGNOSIS — I495 Sick sinus syndrome: Secondary | ICD-10-CM

## 2016-03-11 NOTE — Progress Notes (Signed)
Remote pacemaker transmission.   

## 2016-03-21 LAB — CUP PACEART REMOTE DEVICE CHECK
Battery Remaining Longevity: 32 mo
Battery Remaining Percentage: 35 %
Battery Voltage: 2.83 V
Date Time Interrogation Session: 20170606075739
Implantable Lead Implant Date: 20101122
Implantable Lead Implant Date: 20101122
Implantable Lead Implant Date: 20101122
Lead Channel Impedance Value: 490 Ohm
Lead Channel Impedance Value: 560 Ohm
Lead Channel Pacing Threshold Amplitude: 1 V
Lead Channel Setting Pacing Amplitude: 2 V
Lead Channel Setting Pacing Pulse Width: 0.5 ms
Lead Channel Setting Sensing Sensitivity: 2 mV
MDC IDC LEAD LOCATION: 753858
MDC IDC LEAD LOCATION: 753859
MDC IDC LEAD LOCATION: 753860
MDC IDC MSMT LEADCHNL LV PACING THRESHOLD PULSEWIDTH: 0.8 ms
MDC IDC MSMT LEADCHNL RV PACING THRESHOLD AMPLITUDE: 0.75 V
MDC IDC MSMT LEADCHNL RV PACING THRESHOLD PULSEWIDTH: 0.5 ms
MDC IDC MSMT LEADCHNL RV SENSING INTR AMPL: 12 mV
MDC IDC SET LEADCHNL LV PACING AMPLITUDE: 2 V
MDC IDC SET LEADCHNL LV PACING PULSEWIDTH: 0.8 ms
Pulse Gen Model: 3210
Pulse Gen Serial Number: 2317387

## 2016-03-26 ENCOUNTER — Encounter: Payer: Self-pay | Admitting: Cardiology

## 2016-04-10 ENCOUNTER — Encounter: Payer: Medicare Other | Admitting: *Deleted

## 2016-04-24 DIAGNOSIS — I482 Chronic atrial fibrillation: Secondary | ICD-10-CM | POA: Diagnosis not present

## 2016-04-24 DIAGNOSIS — Z7901 Long term (current) use of anticoagulants: Secondary | ICD-10-CM | POA: Diagnosis not present

## 2016-06-10 ENCOUNTER — Ambulatory Visit (INDEPENDENT_AMBULATORY_CARE_PROVIDER_SITE_OTHER): Payer: Medicare Other | Admitting: *Deleted

## 2016-06-10 DIAGNOSIS — I495 Sick sinus syndrome: Secondary | ICD-10-CM | POA: Diagnosis not present

## 2016-06-10 NOTE — Progress Notes (Signed)
Remote pacemaker transmission.   

## 2016-06-13 ENCOUNTER — Encounter: Payer: Self-pay | Admitting: Cardiology

## 2016-06-13 NOTE — Progress Notes (Signed)
Letter  

## 2016-06-16 DIAGNOSIS — E7800 Pure hypercholesterolemia, unspecified: Secondary | ICD-10-CM | POA: Diagnosis not present

## 2016-06-16 DIAGNOSIS — Z23 Encounter for immunization: Secondary | ICD-10-CM | POA: Diagnosis not present

## 2016-06-16 DIAGNOSIS — Z0000 Encounter for general adult medical examination without abnormal findings: Secondary | ICD-10-CM | POA: Diagnosis not present

## 2016-06-16 DIAGNOSIS — I10 Essential (primary) hypertension: Secondary | ICD-10-CM | POA: Diagnosis not present

## 2016-06-16 DIAGNOSIS — I482 Chronic atrial fibrillation: Secondary | ICD-10-CM | POA: Diagnosis not present

## 2016-06-16 DIAGNOSIS — Z7901 Long term (current) use of anticoagulants: Secondary | ICD-10-CM | POA: Diagnosis not present

## 2016-06-18 LAB — CUP PACEART REMOTE DEVICE CHECK
Battery Remaining Longevity: 28 mo
Battery Remaining Percentage: 29 %
Battery Voltage: 2.81 V
Implantable Lead Implant Date: 20101122
Implantable Lead Implant Date: 20101122
Implantable Lead Location: 753858
Implantable Lead Location: 753859
Implantable Lead Location: 753860
Lead Channel Impedance Value: 510 Ohm
Lead Channel Pacing Threshold Amplitude: 0.75 V
Lead Channel Pacing Threshold Amplitude: 1 V
Lead Channel Pacing Threshold Pulse Width: 0.8 ms
Lead Channel Sensing Intrinsic Amplitude: 6.3 mV
Lead Channel Setting Pacing Amplitude: 2 V
Lead Channel Setting Pacing Amplitude: 2 V
Lead Channel Setting Pacing Pulse Width: 0.5 ms
Lead Channel Setting Pacing Pulse Width: 0.8 ms
Lead Channel Setting Sensing Sensitivity: 2 mV
MDC IDC LEAD IMPLANT DT: 20101122
MDC IDC MSMT LEADCHNL LV IMPEDANCE VALUE: 580 Ohm
MDC IDC MSMT LEADCHNL RV PACING THRESHOLD PULSEWIDTH: 0.5 ms
MDC IDC PG SERIAL: 2317387
MDC IDC SESS DTM: 20170905063211

## 2016-06-20 DIAGNOSIS — Z953 Presence of xenogenic heart valve: Secondary | ICD-10-CM | POA: Diagnosis not present

## 2016-06-20 DIAGNOSIS — Z950 Presence of cardiac pacemaker: Secondary | ICD-10-CM | POA: Diagnosis not present

## 2016-06-20 DIAGNOSIS — Z0000 Encounter for general adult medical examination without abnormal findings: Secondary | ICD-10-CM | POA: Diagnosis not present

## 2016-07-15 DIAGNOSIS — R3121 Asymptomatic microscopic hematuria: Secondary | ICD-10-CM | POA: Diagnosis not present

## 2016-08-18 DIAGNOSIS — Z7901 Long term (current) use of anticoagulants: Secondary | ICD-10-CM | POA: Diagnosis not present

## 2016-08-18 DIAGNOSIS — I482 Chronic atrial fibrillation: Secondary | ICD-10-CM | POA: Diagnosis not present

## 2016-09-19 ENCOUNTER — Encounter: Payer: Self-pay | Admitting: Internal Medicine

## 2016-09-19 ENCOUNTER — Ambulatory Visit (INDEPENDENT_AMBULATORY_CARE_PROVIDER_SITE_OTHER): Payer: Medicare Other | Admitting: Internal Medicine

## 2016-09-19 VITALS — BP 142/70 | HR 70 | Ht 67.0 in | Wt 173.8 lb

## 2016-09-19 DIAGNOSIS — I442 Atrioventricular block, complete: Secondary | ICD-10-CM

## 2016-09-19 DIAGNOSIS — I482 Chronic atrial fibrillation: Secondary | ICD-10-CM | POA: Diagnosis not present

## 2016-09-19 DIAGNOSIS — Z950 Presence of cardiac pacemaker: Secondary | ICD-10-CM

## 2016-09-19 DIAGNOSIS — I4821 Permanent atrial fibrillation: Secondary | ICD-10-CM

## 2016-09-19 NOTE — Progress Notes (Signed)
Patient Care Team: Deland Pretty, MD as PCP - General (Internal Medicine)   HPI  [name] is a 82 y.o. male is seen following CRT P. implantation for symptomatic bradycardia in this state of ischemic heart disease prior bypass surgery status post bioprosthetic aortic valve and ejection fraction of 40%.  He has now permanent atrial fibrillation   Denies sob; doing pretty well        Review of prior data demonstrates that his echo cardiogram 2012l demonstrated normal left ventricular function       Past Medical History:  Diagnosis Date  . Anemia   . Anemia of renal disease 11/20/2011  . Anemia, iron deficiency 11/20/2011  . Aortic stenosis    23-mm St. Jude Biocor porcine bioprosthetic at time of CABG 10/2006  . Atrial fibrillation (HCC)    Paroxysmal, on Coumadin  . Blood transfusion    no hx of reaction to transfusions"  . CAD (coronary artery disease)    CABGx6 10/2006  . Colon cancer [facility])    s/p colon resection  . Diaphragmatic stimulation of the LV lead 08/26/2013  . GERD (gastroesophageal reflux disease)   . Hairy cell leukemia ([place]) 11/12/2012  . Hypertension   . Ischemic cardiomyopathy    EF 40% previously s/p BiV St. Jude PPM. Most recent echo 08/2009 - EF could not be estimated but appeared to be low-normal.  . Prostate cancer ([place])   . Shortness of breath   . Thrombocytopenia ([place]) 11/20/2011    Past Surgical History:  Procedure Laterality Date  . AORTIC VALVE REPLACEMENT    . APPENDECTOMY    . CARDIAC SURGERY    . CORONARY ARTERY BYPASS GRAFT    . HEMICOLECTOMY  2000   right  . INSERT / REPLACE / REMOVE PACEMAKER     biventricular, St. Jude  . left eye cataract extraction      Current Outpatient Prescriptions  Medication Sig Dispense Refill  . carvedilol (COREG) 25 MG tablet Take 12.5 mg by mouth 2 (two) times daily with a meal.     . mirtazapine (REMERON SOL-TAB) 15 MG disintegrating tablet Take 15 mg by mouth at bedtime.     .  Naproxen Sodium (ALEVE PO) Take by mouth as needed.    Marland Kitchen NITROSTAT 0.4 MG SL tablet Place 0.4 mg under the tongue as needed.     . RaNITidine HCl (ZANTAC PO) Take 1 tablet by mouth daily.    . simvastatin (ZOCOR) 40 MG tablet Take 40 mg by mouth at bedtime.      . Tamsulosin HCl (FLOMAX) 0.4 MG CAPS Take 0.4 mg by mouth every evening.     . vitamin B-12 (CYANOCOBALAMIN) 1000 MCG tablet Take 1,000 mcg by mouth every morning.     . warfarin (COUMADIN) 5 MG tablet Take 5 mg by mouth daily. As directed     No current facility-administered medications for this visit.     No Known Allergies  Review of Systems negative except from HPI and PMH  Physical Exam BP (!) 142/70   Pulse 70   Ht 5\' 7"  (1.702 m)   Wt 173 lb 12.8 oz (78.8 kg)   SpO2 98%   BMI 27.22 kg/m  Well developed and nourished in no acute distress HENT normal Neck supple  Clear Regular rate and rhythm, 2/6 murmur Abd-soft with active BS No Clubbing cyanosis edema Skin-warm and dry A & Oriented  Grossly normal sensory and motor function Kyphosis  ECG demonstrates atrial fibrillation with biventricular pacing  Assessment and  Plan  Atrial fibrillation-permanent  Complete heart block  Hypertension  Ischemic Cardiomyopathy   Aortic Valve replacement   Pacemaker-St. Jude    BP reasonably controlled  Device function normal   Without symptoms of ischemia

## 2016-09-19 NOTE — Patient Instructions (Signed)
Medication Instructions: Your physician recommends that you continue on your current medications as directed. Please refer to the Current Medication list given to you today.   Labwork: None ordered  Procedures/Testing: None Ordered  Follow-Up: Remote monitoring is used to monitor your Pacemaker of ICD from home. This monitoring reduces the number of office visits required to check your device to one time per year. It allows [place] to keep an eye on the functioning of your device to ensure it is working properly. You are scheduled for a device check from home on 12/22/16. You may send your transmission at any time that day. If you have a wireless device, the transmission will be sent automatically. After your physician reviews your transmission, you will receive a postcard with your next transmission date.  Your physician wants you to follow-up in: 1 YEAR with Marinus Maw, PA. You will receive a reminder letter in the mail two months in advance. If you don't receive a letter, please call our office to schedule the follow-up appointment.     Any Additional Special Instructions Will Be Listed Below (If Applicable).     If you need a refill on your cardiac medications before your next appointment, please call your pharmacy.

## 2016-10-20 DIAGNOSIS — I482 Chronic atrial fibrillation: Secondary | ICD-10-CM | POA: Diagnosis not present

## 2016-10-20 DIAGNOSIS — Z7901 Long term (current) use of anticoagulants: Secondary | ICD-10-CM | POA: Diagnosis not present

## 2016-12-01 DIAGNOSIS — I482 Chronic atrial fibrillation: Secondary | ICD-10-CM | POA: Diagnosis not present

## 2016-12-01 DIAGNOSIS — Z7901 Long term (current) use of anticoagulants: Secondary | ICD-10-CM | POA: Diagnosis not present

## 2016-12-17 DIAGNOSIS — N183 Chronic kidney disease, stage 3 (moderate): Secondary | ICD-10-CM | POA: Diagnosis not present

## 2016-12-17 DIAGNOSIS — K219 Gastro-esophageal reflux disease without esophagitis: Secondary | ICD-10-CM | POA: Diagnosis not present

## 2016-12-17 DIAGNOSIS — C9140 Hairy cell leukemia not having achieved remission: Secondary | ICD-10-CM | POA: Diagnosis not present

## 2016-12-18 DIAGNOSIS — C9140 Hairy cell leukemia not having achieved remission: Secondary | ICD-10-CM | POA: Diagnosis not present

## 2016-12-18 DIAGNOSIS — R7303 Prediabetes: Secondary | ICD-10-CM | POA: Diagnosis not present

## 2016-12-18 DIAGNOSIS — K219 Gastro-esophageal reflux disease without esophagitis: Secondary | ICD-10-CM | POA: Diagnosis not present

## 2016-12-22 ENCOUNTER — Ambulatory Visit (INDEPENDENT_AMBULATORY_CARE_PROVIDER_SITE_OTHER): Payer: Medicare Other | Admitting: *Deleted

## 2016-12-22 DIAGNOSIS — I442 Atrioventricular block, complete: Secondary | ICD-10-CM

## 2016-12-22 NOTE — Progress Notes (Signed)
Remote pacemaker transmission.   

## 2016-12-24 ENCOUNTER — Encounter: Payer: Self-pay | Admitting: Cardiology

## 2016-12-24 LAB — CUP PACEART REMOTE DEVICE CHECK
Battery Remaining Percentage: 19 %
Battery Voltage: 2.77 V
Date Time Interrogation Session: 20180319074219
Implantable Lead Implant Date: 20101122
Implantable Lead Implant Date: 20101122
Implantable Lead Location: 753859
Lead Channel Impedance Value: 480 Ohm
Lead Channel Impedance Value: 510 Ohm
Lead Channel Pacing Threshold Amplitude: 0.625 V
Lead Channel Pacing Threshold Amplitude: 1 V
Lead Channel Pacing Threshold Pulse Width: 0.5 ms
Lead Channel Pacing Threshold Pulse Width: 0.8 ms
Lead Channel Sensing Intrinsic Amplitude: 5.5 mV
Lead Channel Setting Pacing Amplitude: 2 V
Lead Channel Setting Pacing Amplitude: 2 V
Lead Channel Setting Pacing Pulse Width: 0.5 ms
Lead Channel Setting Pacing Pulse Width: 0.8 ms
Lead Channel Setting Sensing Sensitivity: 2 mV
MDC IDC LEAD IMPLANT DT: 20101122
MDC IDC LEAD LOCATION: 753858
MDC IDC LEAD LOCATION: 753860
MDC IDC MSMT BATTERY REMAINING LONGEVITY: 18 mo
MDC IDC PG IMPLANT DT: 20101122
MDC IDC PG SERIAL: 2317387

## 2017-01-06 DIAGNOSIS — H25813 Combined forms of age-related cataract, bilateral: Secondary | ICD-10-CM | POA: Diagnosis not present

## 2017-01-13 DIAGNOSIS — I482 Chronic atrial fibrillation: Secondary | ICD-10-CM | POA: Diagnosis not present

## 2017-01-13 DIAGNOSIS — H25813 Combined forms of age-related cataract, bilateral: Secondary | ICD-10-CM | POA: Diagnosis not present

## 2017-01-13 DIAGNOSIS — Z7901 Long term (current) use of anticoagulants: Secondary | ICD-10-CM | POA: Diagnosis not present

## 2017-03-17 DIAGNOSIS — I482 Chronic atrial fibrillation: Secondary | ICD-10-CM | POA: Diagnosis not present

## 2017-03-17 DIAGNOSIS — Z7901 Long term (current) use of anticoagulants: Secondary | ICD-10-CM | POA: Diagnosis not present

## 2017-03-23 ENCOUNTER — Ambulatory Visit (INDEPENDENT_AMBULATORY_CARE_PROVIDER_SITE_OTHER): Payer: Medicare Other | Admitting: *Deleted

## 2017-03-23 DIAGNOSIS — I442 Atrioventricular block, complete: Secondary | ICD-10-CM | POA: Diagnosis not present

## 2017-03-23 NOTE — Progress Notes (Signed)
Remote pacemaker transmission.   

## 2017-03-24 LAB — CUP PACEART REMOTE DEVICE CHECK
Battery Remaining Longevity: 12 mo
Battery Remaining Percentage: 12 %
Battery Voltage: 2.72 V
Implantable Lead Implant Date: 20101122
Implantable Lead Implant Date: 20101122
Implantable Lead Location: 753858
Implantable Lead Location: 753859
Implantable Lead Location: 753860
Implantable Pulse Generator Implant Date: 20101122
Lead Channel Impedance Value: 490 Ohm
Lead Channel Pacing Threshold Amplitude: 0.625 V
Lead Channel Pacing Threshold Pulse Width: 0.5 ms
Lead Channel Pacing Threshold Pulse Width: 0.8 ms
Lead Channel Setting Pacing Amplitude: 2 V
Lead Channel Setting Pacing Amplitude: 2 V
Lead Channel Setting Sensing Sensitivity: 2 mV
MDC IDC LEAD IMPLANT DT: 20101122
MDC IDC MSMT LEADCHNL LV PACING THRESHOLD AMPLITUDE: 1 V
MDC IDC MSMT LEADCHNL RV IMPEDANCE VALUE: 480 Ohm
MDC IDC MSMT LEADCHNL RV SENSING INTR AMPL: 12 mV
MDC IDC SESS DTM: 20180618061828
MDC IDC SET LEADCHNL LV PACING PULSEWIDTH: 0.8 ms
MDC IDC SET LEADCHNL RV PACING PULSEWIDTH: 0.5 ms
Pulse Gen Model: 3210
Pulse Gen Serial Number: 2317387

## 2017-03-26 ENCOUNTER — Encounter: Payer: Self-pay | Admitting: Cardiology

## 2017-04-02 ENCOUNTER — Telehealth: Payer: Self-pay | Admitting: Internal Medicine

## 2017-04-02 NOTE — Telephone Encounter (Signed)
Jimmey Ralph, RN  Emily Filbert, RN        His transmissions shows ~59yr on the battery with total BiVP at 97%. No episodes listed. His device does not have thoracic impedance capabilities.     Will forward to Dr. Caryl Comes to review.

## 2017-04-02 NOTE — Telephone Encounter (Signed)
I spoke with the patient.  He states that for the last 3 days he has had SOB while walking to the mailbox and bilateral swelling to his feet and up to his calves.  He has had no change in his sodium intake and feels as though his urine output is stable.  He was previously able to ambulate to the mailbox without difficulty last week.  I have advised the patient to please try to transmit a reading from his device to [place]. I will review with Dr. Caryl Comes and call him back. Message sent to device clinic to look out for this.   The patient is agreeable.

## 2017-04-02 NOTE — Telephone Encounter (Signed)
New message    Pt c/o swelling: STAT is pt has developed SOB within 24 hours  1. How long have you been experiencing swelling? For a couple days and will not go away  2. Where is the swelling located? Feet and legs  3.  Are you currently taking a "fluid pill"? No  4.  Are you currently SOB? No, but pt states he cannot get the swelling to go down. Will be available to answer calls after 1145  5.  Have you traveled recently? no

## 2017-04-02 NOTE — Telephone Encounter (Signed)
Reviewed with Dr. Caryl Comes- orders received to have the patient come in for labs tomorrwow to check a CBC/ BMP/ BNP prior to giving him any medications.  I have spoken with the patient and notified him of the above. He states he has no way to get to the office tomorrow for labs. He reports having some furosemide 20 mg tablets at home. I advised him not to take any as I will need to speak with Dr. Caryl Comes in the morning for further recommendations. I will call him back in the morning.  He is agreeable.

## 2017-04-03 NOTE — Telephone Encounter (Signed)
Reviewed the patient with Dr. Caryl Comes. I advised him he did not have a ride to come in today for labs, but did have lasix at home.  Per Dr. Caryl Comes- have the patient take lasix 40 mg every other day and come for labs next week. I have spoken with the patient and notified him of this. He is aware of Dr. Olin Pia recommendations for lasix 40 mg once every other day. He states he is unsure if he can get a ride for labs next week. He also states he is feeling some better today. I have advised him that he may take the lasix every other day as prescribed, but to call me back next week if he is not doing any better and he will have to get his lab work checked some how.  Otherwise, I have instructed him after a couple doses of the lasix, if he is feeling better, to stop taking this as we do not know what his potassium and renal function are doing. He voices understanding.

## 2017-04-10 ENCOUNTER — Telehealth: Payer: Self-pay | Admitting: Internal Medicine

## 2017-04-10 ENCOUNTER — Other Ambulatory Visit: Payer: Self-pay

## 2017-04-10 DIAGNOSIS — R600 Localized edema: Secondary | ICD-10-CM

## 2017-04-10 NOTE — Telephone Encounter (Signed)
I called and spoke with the patient. He states he is still having some swelling to his lower extremities and pain in his legs only when he walks. He has been taking lasix 40 mg QOD and does urinate well on the days he takes his lasix. I have advised him that we need to check his labs in order to know how to dose his medication more appropriately . He voices understanding.  He will come on Monday for labs (BMP/CBC/BNP). I advised him to continue lasix 40 mg QOD until through the weekend until labs are back.  He voices understanding.

## 2017-04-10 NOTE — Telephone Encounter (Signed)
New message    Pt is calling stating that he is still having swelling and pain.   Pt c/o swelling: STAT is pt has developed SOB within 24 hours  1. How long have you been experiencing swelling? A week.  2. Where is the swelling located? feet  3.  Are you currently taking a "fluid pill"? Yes   4.  Are you currently SOB? Yes, when he walks.   5.  Have you traveled recently? No

## 2017-04-13 ENCOUNTER — Other Ambulatory Visit: Payer: Self-pay | Admitting: *Deleted

## 2017-04-13 ENCOUNTER — Other Ambulatory Visit: Payer: Medicare Other

## 2017-04-13 ENCOUNTER — Telehealth: Payer: Self-pay | Admitting: *Deleted

## 2017-04-13 DIAGNOSIS — R0602 Shortness of breath: Secondary | ICD-10-CM

## 2017-04-13 DIAGNOSIS — R600 Localized edema: Secondary | ICD-10-CM | POA: Diagnosis not present

## 2017-04-13 DIAGNOSIS — R609 Edema, unspecified: Secondary | ICD-10-CM

## 2017-04-13 LAB — PRO B NATRIURETIC PEPTIDE: NT-Pro BNP: 11693 pg/mL — ABNORMAL HIGH (ref 0–486)

## 2017-04-13 NOTE — Telephone Encounter (Signed)
Notes recorded by Katrine Coho, RN on 04/13/2017 at 4:43 PM EDT I spoke with patient-pt states his LE edema is slightly better today since Friday, pt states he is urinating well, shortness of breath stable. Pt confirmed he has been taking lasix 40 mg every other day and took it today after lab was done. Pt advised I will forward to Dr Caryl Comes for review and will plan to follow-up with him tomorrow after review by Dr Caryl Comes.  Preliminarily reviewed by Triage. Awaiting MD review and signature.     Ref Range & Units 10:30  NT-Pro BNP 0 - 486 pg/mL 11,693         These lab results are from today--NT-Pro BNP 11,693.

## 2017-04-13 NOTE — Progress Notes (Signed)
Received t/c from lab.  Pt has been ordered to have a BNP however our lab does Pro-BNP and needs to be reordered.  Pt here now.  Order placed as requested.

## 2017-04-14 LAB — BASIC METABOLIC PANEL
BUN / CREAT RATIO: 14 (ref 10–24)
BUN: 24 mg/dL (ref 10–36)
CO2: 21 mmol/L (ref 20–29)
Calcium: 9.5 mg/dL (ref 8.6–10.2)
Chloride: 105 mmol/L (ref 96–106)
Creatinine, Ser: 1.66 mg/dL — ABNORMAL HIGH (ref 0.76–1.27)
GFR calc Af Amer: 40 mL/min/{1.73_m2} — ABNORMAL LOW (ref >59)
GFR, EST NON AFRICAN AMERICAN: 35 mL/min/{1.73_m2} — AB (ref >59)
Glucose: 104 mg/dL — ABNORMAL HIGH (ref 65–99)
Potassium: 4.1 mmol/L (ref 3.5–5.2)
Sodium: 141 mmol/L (ref 134–144)

## 2017-04-14 LAB — CBC WITH DIFFERENTIAL/PLATELET
BASOS: 0 %
Basophils Absolute: 0 10*3/uL (ref 0.0–0.2)
EOS (ABSOLUTE): 0.1 10*3/uL (ref 0.0–0.4)
EOS: 2 %
HEMATOCRIT: 36.9 % — AB (ref 37.5–51.0)
HEMOGLOBIN: 12.6 g/dL — AB (ref 13.0–17.7)
Immature Grans (Abs): 0 10*3/uL (ref 0.0–0.1)
Immature Granulocytes: 0 %
LYMPHS ABS: 0.6 10*3/uL — AB (ref 0.7–3.1)
Lymphs: 18 %
MCH: 30.1 pg (ref 26.6–33.0)
MCHC: 34.1 g/dL (ref 31.5–35.7)
MCV: 88 fL (ref 79–97)
Monocytes Absolute: 0.4 10*3/uL (ref 0.1–0.9)
Monocytes: 10 %
Neutrophils Absolute: 2.5 10*3/uL (ref 1.4–7.0)
Neutrophils: 70 %
Platelets: 63 10*3/uL — CL (ref 150–379)
RBC: 4.18 x10E6/uL (ref 4.14–5.80)
RDW: 15 % (ref 12.3–15.4)
WBC: 3.6 10*3/uL (ref 3.4–10.8)

## 2017-04-14 MED ORDER — FUROSEMIDE 40 MG PO TABS: ORAL_TABLET | ORAL | 2 refills | Status: DC

## 2017-04-14 NOTE — Telephone Encounter (Signed)
Reviewed patient labs with Dr. Caryl Comes- orders received to have the patient take lasix 80 mg once daily x 3 days, then we will follow up with him on Friday. The patient verbalizes understanding.

## 2017-04-17 NOTE — Telephone Encounter (Signed)
Reviewed the patient's symptoms with Dr. Caryl Comes. Orders received to have the patient continue lasix 80 mg daily x 3 more days, then take lasix 80 mg once every other day. He will need a repeat BMP next week.  I attempted to call the patient.  I left a message on his voice mail regarding the above recommendations for Dr. Caryl Comes and that I will call him back next week to follow up.

## 2017-04-17 NOTE — Telephone Encounter (Signed)
I called and spoke with the patient to follow up on symptoms.  He states his SOB is some improved as well as his swelling. He is urinating well on lasix 80 mg daily- I advised I will review with Dr. Caryl Comes and call him back. He is agreeable.

## 2017-04-21 MED ORDER — FUROSEMIDE 40 MG PO TABS: ORAL_TABLET | ORAL | Status: DC

## 2017-04-21 NOTE — Telephone Encounter (Signed)
I called the patient to follow up on his symptoms and make sure he got my message from Friday regarding his furosemide. He states he did get the message, but he has only been taking lasix 40 mg once daily over the weekend.  He states he continues to urinate and his swelling continues to go down. Reviewed with Dr. Caryl Comes- continue lasix 40 mg once daily, f/u BMP on Friday this week and see Renee, PA in follow up the following week.  The patient verbalizes understanding of the above and will see Renee on 05/01/17 at 11:00 am.

## 2017-04-21 NOTE — Addendum Note (Signed)
Addended by: Alvis Lemmings C on: 04/21/2017 05:05 PM   Modules accepted: Orders

## 2017-04-24 ENCOUNTER — Other Ambulatory Visit: Payer: Medicare Other | Admitting: *Deleted

## 2017-04-24 DIAGNOSIS — R609 Edema, unspecified: Secondary | ICD-10-CM

## 2017-04-24 DIAGNOSIS — R0602 Shortness of breath: Secondary | ICD-10-CM | POA: Diagnosis not present

## 2017-04-24 LAB — BASIC METABOLIC PANEL
BUN / CREAT RATIO: 17 (ref 10–24)
BUN: 27 mg/dL (ref 10–36)
CHLORIDE: 102 mmol/L (ref 96–106)
CO2: 23 mmol/L (ref 20–29)
Calcium: 9.4 mg/dL (ref 8.6–10.2)
Creatinine, Ser: 1.56 mg/dL — ABNORMAL HIGH (ref 0.76–1.27)
GFR calc non Af Amer: 38 mL/min/{1.73_m2} — ABNORMAL LOW (ref >59)
GFR, EST AFRICAN AMERICAN: 44 mL/min/{1.73_m2} — AB (ref >59)
GLUCOSE: 94 mg/dL (ref 65–99)
Potassium: 4.2 mmol/L (ref 3.5–5.2)
SODIUM: 141 mmol/L (ref 134–144)

## 2017-04-27 ENCOUNTER — Telehealth: Payer: Self-pay

## 2017-04-27 NOTE — Telephone Encounter (Signed)
Pt is aware and agreeable to stable abnormality with renal function. Reiterated to patient to continue to take his lasix (40 mg) qd. He was agreeable.

## 2017-04-30 NOTE — Progress Notes (Signed)
Cardiology Office Note Date:  05/01/2017  Patient ID:  [name], [name] [birth year], MRN [id number] PCP:  Deland Pretty, MD  Cardiologist:  Dr. Caryl Comes   Chief Complaint: f/u recent fluid OL  History of Present Illness: [name] is a 83 y.o. male with history of permanent AF,  CHB, ICM w/ CRT-P, CAD (CABG remotely), VHD w/ AVR (bioprosthetic), chronic anemia, thrombocytopenia,  comes in today to be seen for Dr. Caryl Comes.  Last seen by him in Dec, a that time was doing well, though of late has been needing increased dosing of his lasix with edema.  He comes in today accompanied by his daughter.  Since taking the lasix he is doing much better, he reports back to baseline as far as swelling and symptoms are concerns.  He denies aver having any kind of CP, though with the swelling was getting easily winded.  He spends most of his time watching TV but ambulates around the house and brief outings typically without SOB, gives an example of to the mailbox and back, this had become difficult for him with SOB, now is able to do this again.  No dizziness, near syncope or syncope.  He denies any bleeding or signs of bleeding, his PMD office manages his coumadin.  The patient's daughter inquires as to why he would have started to swell/get SOB like he did, difficult to know, could offer an echo to evaluate his EF/VHD, not likely to change management significantly, if renal function allowed might consider ACE.  They would like the information.   Device information: SJM CRT-P, implanted 08/27/09, Dr. Caryl Comes  Past Medical History:  Diagnosis Date  . Anemia   . Anemia of renal disease 11/20/2011  . Anemia, iron deficiency 11/20/2011  . Aortic stenosis    23-mm St. Jude Biocor porcine bioprosthetic at time of CABG 10/2006  . Atrial fibrillation (HCC)    Paroxysmal, on Coumadin  . Blood transfusion    no hx of reaction to transfusions"  . CAD (coronary artery disease)    CABGx6 10/2006  . Colon cancer  [facility])    s/p colon resection  . Diaphragmatic stimulation of the LV lead 08/26/2013  . GERD (gastroesophageal reflux disease)   . Hairy cell leukemia ([place]) 11/12/2012  . Hypertension   . Ischemic cardiomyopathy    EF 40% previously s/p BiV St. Jude PPM. Most recent echo 08/2009 - EF could not be estimated but appeared to be low-normal.  . Prostate cancer ([place])   . Shortness of breath   . Thrombocytopenia ([place]) 11/20/2011    Past Surgical History:  Procedure Laterality Date  . AORTIC VALVE REPLACEMENT    . APPENDECTOMY    . CARDIAC SURGERY    . CORONARY ARTERY BYPASS GRAFT    . HEMICOLECTOMY  2000   right  . INSERT / REPLACE / REMOVE PACEMAKER     biventricular, St. Jude  . left eye cataract extraction      Current Outpatient Prescriptions  Medication Sig Dispense Refill  . carvedilol (COREG) 25 MG tablet Take 12.5 mg by mouth 2 (two) times daily with a meal.     . furosemide (LASIX) 40 MG tablet Take one tablet (40 mg) by mouth once daily    . mirtazapine (REMERON SOL-TAB) 15 MG disintegrating tablet Take 15 mg by mouth at bedtime.     . Naproxen Sodium (ALEVE PO) Take by mouth as needed.    Marland Kitchen NITROSTAT 0.4 MG SL tablet Place 0.4  mg under the tongue as needed.     . RaNITidine HCl (ZANTAC PO) Take 1 tablet by mouth daily.    . simvastatin (ZOCOR) 40 MG tablet Take 40 mg by mouth at bedtime.      . Tamsulosin HCl (FLOMAX) 0.4 MG CAPS Take 0.4 mg by mouth every evening.     . vitamin B-12 (CYANOCOBALAMIN) 1000 MCG tablet Take 1,000 mcg by mouth every morning.     . warfarin (COUMADIN) 5 MG tablet Take 5 mg by mouth daily. As directed     No current facility-administered medications for this visit.     Allergies:   Patient has no known allergies.   Social History:  The patient  reports that he quit smoking about 35 years ago. His smoking use included Pipe. He has never used smokeless tobacco. He reports that he does not drink alcohol or use drugs.   Family History:  The  patient's family history includes Heart disease in his mother.  ROS:  Please see the history of present illness.  All other systems are reviewed and otherwise negative.   PHYSICAL EXAM: VS:  BP 110/68   Pulse 70   Ht 5\' 7"  (1.702 m)   Wt 169 lb 12.8 oz (77 kg)   SpO2 98%   BMI 26.59 kg/m  BMI: Body mass index is 26.59 kg/m. Well nourished, well developed, in no acute distress  HEENT: normocephalic, atraumatic  Neck: no JVD, carotid bruits or masses Cardiac:  RRR (paced); no significant murmurs, no rubs, or gallops Lungs:  CTA b/l , no wheezing, rhonchi or rales  Abd: soft, nontender MS: no deformity, age appropriate atrophy Ext:  Trace edema remains Skin: warm and dry, no rash Neuro:  No gross deficits appreciated Psych: euthymic mood, full affect  PPM site is stable, no tethering or discomfort   EKG:  Done 09/19/16 V paced PPM interrogation done today by industry and reviewed by myself: battery longevity estimate at 10 months, lead measurements are stable, no V arrhythmias, CBH V paced at 30  11/12/11: TTE Study Conclusion - Left ventricle: Septal and inferobasal hypokinesis The cavity size was normal. Wall thickness was increased in a pattern of mild LVH. The estimated ejection fraction was 50%. - Aortic valve: Tissue AVR calcified. Mild AR Not well visualized - Mitral valve: Mild regurgitation. - Left atrium: The atrium was mildly dilated. - Atrial septum: No defect or patent foramen ovale was identified.  Recent Labs: 04/13/2017: Hemoglobin 12.6; NT-Pro BNP 11,693; Platelets 63 04/24/2017: BUN 27; Creatinine, Ser 1.56; Potassium 4.2; Sodium 141  No results found for requested labs within last 8760 hours.   Estimated Creatinine Clearance: 27.7 mL/min (A) (by C-G formula based on SCr of 1.56 mg/dL (H)).   Wt Readings from Last 3 Encounters:  05/01/17 169 lb 12.8 oz (77 kg)  09/19/16 173 lb 12.8 oz (78.8 kg)  09/11/15 178 lb (80.7 kg)     Other studies  reviewed: Additional studies/records reviewed today include: summarized above  ASSESSMENT AND PLAN:  1. Fluid OL     Weight is reported to be at his baseline as well as symptoms     BMET stable on lasix  2. CAD      No anginal symptoms     On BB, statin tx  3. PPM     Battery is nearing ERI, estimated 10 months, they are doing remote checks  4. HTN     Looks OK, no changes  5. Permanent AFib  CHA2DS2Vasc is at least 3, on warfarin  6. Hx of Bioprosthetic AVR     Exam sounds stable  Disposition: The patient/daughter would like the information from the echo, will plan for remote pacer check in 3 months. In-clinic f/u in 6 months otherwise, sooner if needed  Current medicines are reviewed at length with the patient today.  The patient did not have any concerns regarding medicines.  Haywood Lasso, PA-C 05/01/2017 1:50 PM     [address] [place] [address] [phone] (office)  [phone] (fax)

## 2017-05-01 ENCOUNTER — Ambulatory Visit (INDEPENDENT_AMBULATORY_CARE_PROVIDER_SITE_OTHER): Payer: Medicare Other | Admitting: Physician Assistant

## 2017-05-01 ENCOUNTER — Encounter: Payer: Self-pay | Admitting: Physician Assistant

## 2017-05-01 VITALS — BP 110/68 | HR 70 | Ht 67.0 in | Wt 169.8 lb

## 2017-05-01 DIAGNOSIS — I2510 Atherosclerotic heart disease of native coronary artery without angina pectoris: Secondary | ICD-10-CM | POA: Diagnosis not present

## 2017-05-01 DIAGNOSIS — I10 Essential (primary) hypertension: Secondary | ICD-10-CM

## 2017-05-01 DIAGNOSIS — I482 Chronic atrial fibrillation: Secondary | ICD-10-CM

## 2017-05-01 DIAGNOSIS — I5032 Chronic diastolic (congestive) heart failure: Secondary | ICD-10-CM | POA: Diagnosis not present

## 2017-05-01 DIAGNOSIS — Z950 Presence of cardiac pacemaker: Secondary | ICD-10-CM

## 2017-05-01 DIAGNOSIS — I4821 Permanent atrial fibrillation: Secondary | ICD-10-CM

## 2017-05-01 NOTE — Patient Instructions (Addendum)
Medication Instructions:   Your physician recommends that you continue on your current medications as directed. Please refer to the Current Medication list given to you today.   If you need a refill on your cardiac medications before your next appointment, please call your pharmacy.  Labwork: NONE ORDERED  TODAY    Testing/Procedures: Your physician has requested that you have an echocardiogram. Echocardiography is a painless test that uses sound waves to create images of your heart. It provides your doctor with information about the size and shape of your heart and how well your heart's chambers and valves are working. This procedure takes approximately one hour. There are no restrictions for this procedure.     Follow-Up: Remote monitoring is used to monitor your Pacemaker of ICD from home. This monitoring reduces the number of office visits required to check your device to one time per year. It allows [place] to keep an eye on the functioning of your device to ensure it is working properly. You are scheduled for a device check from home on . 07-30-17 You may send your transmission at any time that day. If you have a wireless device, the transmission will be sent automatically. After your physician reviews your transmission, you will receive a postcard with your next transmission date.  Your physician wants you to follow-up in: [place] will receive a reminder letter in the mail two months in advance. If you don't receive a letter, please call our office to schedule the follow-up appointment.     Any Other Special Instructions Will Be Listed Below (If Applicable).

## 2017-05-06 ENCOUNTER — Other Ambulatory Visit: Payer: Self-pay

## 2017-05-06 ENCOUNTER — Ambulatory Visit (HOSPITAL_COMMUNITY): Payer: Medicare Other | Attending: Cardiology

## 2017-05-06 DIAGNOSIS — I5032 Chronic diastolic (congestive) heart failure: Secondary | ICD-10-CM | POA: Diagnosis not present

## 2017-05-06 DIAGNOSIS — I083 Combined rheumatic disorders of mitral, aortic and tricuspid valves: Secondary | ICD-10-CM | POA: Diagnosis not present

## 2017-05-06 DIAGNOSIS — I420 Dilated cardiomyopathy: Secondary | ICD-10-CM | POA: Diagnosis not present

## 2017-05-06 LAB — ECHOCARDIOGRAM COMPLETE
AO mean calculated velocity dopler: 156 cm/s
AOPV: 0.29 m/s
AVCELMEANRAT: 0.24
AVG: 11 mmHg
AVPG: 17 mmHg
AVPKVEL: 209 cm/s
CHL CUP DOP CALC LVOT VTI: 11.9 cm
CHL CUP REG VEL DIAS: 93.7 cm/s
CHL CUP RV SYS PRESS: 37 mmHg
CHL CUP TV REG PEAK VELOCITY: 232 cm/s
FS: 26 % — AB (ref 28–44)
IVS/LV PW RATIO, ED: 0.86
LA ID, A-P, ES: 45 mm
LA diam end sys: 45 mm
LA vol A4C: 79.6 ml
LA vol index: 52.9 mL/m2
LADIAMINDEX: 2.38 cm/m2
LAVOL: 99.9 mL
LVOT peak VTI: 0.27 cm
LVOT peak vel: 60.1 cm/s
P 1/2 time: 395 ms
PW: 13.4 mm — AB (ref 0.6–1.1)
TRMAXVEL: 232 cm/s
VTI: 44.2 cm

## 2017-05-19 DIAGNOSIS — Z7901 Long term (current) use of anticoagulants: Secondary | ICD-10-CM | POA: Diagnosis not present

## 2017-05-19 DIAGNOSIS — I482 Chronic atrial fibrillation: Secondary | ICD-10-CM | POA: Diagnosis not present

## 2017-06-22 ENCOUNTER — Ambulatory Visit (INDEPENDENT_AMBULATORY_CARE_PROVIDER_SITE_OTHER): Payer: Medicare Other | Admitting: *Deleted

## 2017-06-22 DIAGNOSIS — I442 Atrioventricular block, complete: Secondary | ICD-10-CM | POA: Diagnosis not present

## 2017-06-22 NOTE — Progress Notes (Signed)
Remote pacemaker transmission.   

## 2017-06-23 DIAGNOSIS — D696 Thrombocytopenia, unspecified: Secondary | ICD-10-CM | POA: Diagnosis not present

## 2017-06-23 DIAGNOSIS — E7800 Pure hypercholesterolemia, unspecified: Secondary | ICD-10-CM | POA: Diagnosis not present

## 2017-06-23 DIAGNOSIS — Z0000 Encounter for general adult medical examination without abnormal findings: Secondary | ICD-10-CM | POA: Diagnosis not present

## 2017-06-23 DIAGNOSIS — I10 Essential (primary) hypertension: Secondary | ICD-10-CM | POA: Diagnosis not present

## 2017-06-23 DIAGNOSIS — Z23 Encounter for immunization: Secondary | ICD-10-CM | POA: Diagnosis not present

## 2017-06-24 ENCOUNTER — Encounter: Payer: Self-pay | Admitting: Cardiology

## 2017-06-24 LAB — CUP PACEART REMOTE DEVICE CHECK
Date Time Interrogation Session: 20180919110628
Implantable Lead Implant Date: 20101122
Implantable Pulse Generator Implant Date: 20101122
Lead Channel Setting Pacing Amplitude: 2 V
Lead Channel Setting Pacing Amplitude: 2 V
Lead Channel Setting Pacing Pulse Width: 0.5 ms
Lead Channel Setting Pacing Pulse Width: 0.8 ms
MDC IDC LEAD IMPLANT DT: 20101122
MDC IDC LEAD IMPLANT DT: 20101122
MDC IDC LEAD LOCATION: 753858
MDC IDC LEAD LOCATION: 753859
MDC IDC LEAD LOCATION: 753860
MDC IDC PG SERIAL: 2317387
MDC IDC SET LEADCHNL RV SENSING SENSITIVITY: 2 mV

## 2017-06-26 DIAGNOSIS — Z0001 Encounter for general adult medical examination with abnormal findings: Secondary | ICD-10-CM | POA: Diagnosis not present

## 2017-07-21 DIAGNOSIS — Z7901 Long term (current) use of anticoagulants: Secondary | ICD-10-CM | POA: Diagnosis not present

## 2017-07-21 DIAGNOSIS — I482 Chronic atrial fibrillation: Secondary | ICD-10-CM | POA: Diagnosis not present

## 2017-08-12 DIAGNOSIS — H26492 Other secondary cataract, left eye: Secondary | ICD-10-CM | POA: Diagnosis not present

## 2017-08-12 DIAGNOSIS — H02135 Senile ectropion of left lower eyelid: Secondary | ICD-10-CM | POA: Diagnosis not present

## 2017-08-12 DIAGNOSIS — H43813 Vitreous degeneration, bilateral: Secondary | ICD-10-CM | POA: Diagnosis not present

## 2017-08-12 DIAGNOSIS — Z961 Presence of intraocular lens: Secondary | ICD-10-CM | POA: Diagnosis not present

## 2017-08-12 DIAGNOSIS — H40013 Open angle with borderline findings, low risk, bilateral: Secondary | ICD-10-CM | POA: Diagnosis not present

## 2017-08-26 DIAGNOSIS — Z961 Presence of intraocular lens: Secondary | ICD-10-CM | POA: Diagnosis not present

## 2017-08-26 DIAGNOSIS — H40013 Open angle with borderline findings, low risk, bilateral: Secondary | ICD-10-CM | POA: Diagnosis not present

## 2017-08-26 DIAGNOSIS — H26492 Other secondary cataract, left eye: Secondary | ICD-10-CM | POA: Diagnosis not present

## 2017-09-07 DIAGNOSIS — H5203 Hypermetropia, bilateral: Secondary | ICD-10-CM | POA: Diagnosis not present

## 2017-09-07 DIAGNOSIS — Z961 Presence of intraocular lens: Secondary | ICD-10-CM | POA: Diagnosis not present

## 2017-09-21 ENCOUNTER — Ambulatory Visit (INDEPENDENT_AMBULATORY_CARE_PROVIDER_SITE_OTHER): Payer: Medicare Other | Admitting: *Deleted

## 2017-09-21 DIAGNOSIS — I442 Atrioventricular block, complete: Secondary | ICD-10-CM | POA: Diagnosis not present

## 2017-09-21 NOTE — Progress Notes (Signed)
Remote pacemaker transmission.   

## 2017-09-23 ENCOUNTER — Encounter: Payer: Self-pay | Admitting: Cardiology

## 2017-09-23 DIAGNOSIS — Z7901 Long term (current) use of anticoagulants: Secondary | ICD-10-CM | POA: Diagnosis not present

## 2017-09-23 DIAGNOSIS — I482 Chronic atrial fibrillation: Secondary | ICD-10-CM | POA: Diagnosis not present

## 2017-09-23 LAB — CUP PACEART REMOTE DEVICE CHECK
Battery Remaining Longevity: 1 mo
Date Time Interrogation Session: 20181217072322
Implantable Lead Implant Date: 20101122
Implantable Lead Location: 753858
Lead Channel Pacing Threshold Amplitude: 0.625 V
Lead Channel Pacing Threshold Pulse Width: 0.5 ms
Lead Channel Setting Pacing Amplitude: 2 V
Lead Channel Setting Pacing Pulse Width: 0.8 ms
MDC IDC LEAD IMPLANT DT: 20101122
MDC IDC LEAD IMPLANT DT: 20101122
MDC IDC LEAD LOCATION: 753859
MDC IDC LEAD LOCATION: 753860
MDC IDC MSMT BATTERY REMAINING PERCENTAGE: 1 %
MDC IDC MSMT BATTERY VOLTAGE: 2.63 V
MDC IDC MSMT LEADCHNL LV IMPEDANCE VALUE: 510 Ohm
MDC IDC MSMT LEADCHNL LV PACING THRESHOLD AMPLITUDE: 1 V
MDC IDC MSMT LEADCHNL LV PACING THRESHOLD PULSEWIDTH: 0.8 ms
MDC IDC MSMT LEADCHNL RV IMPEDANCE VALUE: 490 Ohm
MDC IDC MSMT LEADCHNL RV SENSING INTR AMPL: 12 mV
MDC IDC PG IMPLANT DT: 20101122
MDC IDC SET LEADCHNL RV PACING AMPLITUDE: 2 V
MDC IDC SET LEADCHNL RV PACING PULSEWIDTH: 0.5 ms
MDC IDC SET LEADCHNL RV SENSING SENSITIVITY: 2 mV
Pulse Gen Model: 3210
Pulse Gen Serial Number: 2317387

## 2017-11-03 ENCOUNTER — Encounter: Payer: Self-pay | Admitting: Internal Medicine

## 2017-11-03 ENCOUNTER — Ambulatory Visit: Payer: Medicare Other | Admitting: Internal Medicine

## 2017-11-03 VITALS — BP 144/74 | HR 73 | Ht 69.0 in | Wt 179.2 lb

## 2017-11-03 DIAGNOSIS — Z950 Presence of cardiac pacemaker: Secondary | ICD-10-CM | POA: Diagnosis not present

## 2017-11-03 DIAGNOSIS — Z79899 Other long term (current) drug therapy: Secondary | ICD-10-CM | POA: Diagnosis not present

## 2017-11-03 DIAGNOSIS — I442 Atrioventricular block, complete: Secondary | ICD-10-CM

## 2017-11-03 LAB — CUP PACEART INCLINIC DEVICE CHECK
Battery Remaining Longevity: 0 mo
Battery Voltage: 2.62 V
Implantable Lead Implant Date: 20101122
Implantable Lead Location: 753860
Implantable Pulse Generator Implant Date: 20101122
Lead Channel Impedance Value: 487.5 Ohm
Lead Channel Pacing Threshold Amplitude: 0.75 V
Lead Channel Pacing Threshold Amplitude: 1 V
Lead Channel Pacing Threshold Pulse Width: 0.8 ms
Lead Channel Setting Pacing Amplitude: 2 V
Lead Channel Setting Pacing Amplitude: 2 V
Lead Channel Setting Pacing Pulse Width: 0.8 ms
MDC IDC LEAD IMPLANT DT: 20101122
MDC IDC LEAD IMPLANT DT: 20101122
MDC IDC LEAD LOCATION: 753858
MDC IDC LEAD LOCATION: 753859
MDC IDC MSMT LEADCHNL LV IMPEDANCE VALUE: 525 Ohm
MDC IDC MSMT LEADCHNL RV PACING THRESHOLD PULSEWIDTH: 0.5 ms
MDC IDC MSMT LEADCHNL RV SENSING INTR AMPL: 12 mV
MDC IDC PG SERIAL: 2317387
MDC IDC SESS DTM: 20190129173509
MDC IDC SET LEADCHNL RV PACING PULSEWIDTH: 0.5 ms
MDC IDC SET LEADCHNL RV SENSING SENSITIVITY: 2 mV
MDC IDC STAT BRADY RA PERCENT PACED: 0 %
MDC IDC STAT BRADY RV PERCENT PACED: 98 %

## 2017-11-03 NOTE — Patient Instructions (Addendum)
Medication Instructions:  Your physician recommends that you continue on your current medications as directed. Please refer to the Current Medication list given to you today.   Labwork: Your physician recommends that you have labs drawn today: -PT/INR, CBC, BMET  Testing/Procedures: None ordered.  Follow-Up: Remote monitoring is used to monitor your Pacemaker of ICD from home. This monitoring reduces the number of office visits required to check your device to one time per year. It allows [place] to keep an eye on the functioning of your device to ensure it is working properly. You are scheduled for a device check from home on 12/04/2017. You may send your transmission at any time that day. If you have a wireless device, the transmission will be sent automatically. After your physician reviews your transmission, you will receive a postcard with your next transmission date.  Your physician wants you to follow-up in: [place] Caryl Comes. You will receive a reminder letter in the mail two months in advance. If you don't receive a letter, please call our office to schedule the follow-up appointment.    Any Other Special Instructions Will Be Listed Below (If Applicable).     If you need a refill on your cardiac medications before your next appointment, please call your pharmacy.

## 2017-11-03 NOTE — Progress Notes (Signed)
Patient Care Team: Deland Pretty, MD as PCP - General (Internal Medicine)   HPI  [name] is a 84 y.o. male is seen following CRT P. implantation for symptomatic bradycardia in this state of ischemic heart disease prior bypass surgery status post bioprosthetic aortic valve and ejection fraction of 40%.   Echo 818 demonstrated EF 40-45% mean gradient was 11 He has now permanent atrial fibrillation   Denies sob; doing pretty well   His big problem is weakness in his legs if he walks too far  And is sob   Denies chest pain.  He has stable peripheral edema.    Date Cr K Hgb  7/18 1.56 4.2 12.6         No obvious bleeding  Review of prior data demonstrates that his echo cardiogram 2012l demonstrated normal left ventricular function       Past Medical History:  Diagnosis Date  . Anemia   . Anemia of renal disease 11/20/2011  . Anemia, iron deficiency 11/20/2011  . Aortic stenosis    23-mm St. Jude Biocor porcine bioprosthetic at time of CABG 10/2006  . Atrial fibrillation (HCC)    Paroxysmal, on Coumadin  . Blood transfusion    no hx of reaction to transfusions"  . CAD (coronary artery disease)    CABGx6 10/2006  . Colon cancer [facility])    s/p colon resection  . Diaphragmatic stimulation of the LV lead 08/26/2013  . GERD (gastroesophageal reflux disease)   . Hairy cell leukemia ([place]) 11/12/2012  . Hypertension   . Ischemic cardiomyopathy    EF 40% previously s/p BiV St. Jude PPM. Most recent echo 08/2009 - EF could not be estimated but appeared to be low-normal.  . Prostate cancer ([place])   . Shortness of breath   . Thrombocytopenia ([place]) 11/20/2011    Past Surgical History:  Procedure Laterality Date  . AORTIC VALVE REPLACEMENT    . APPENDECTOMY    . CARDIAC SURGERY    . CORONARY ARTERY BYPASS GRAFT    . HEMICOLECTOMY  2000   right  . INSERT / REPLACE / REMOVE PACEMAKER     biventricular, St. Jude  . left eye cataract extraction      Current  Outpatient Medications  Medication Sig Dispense Refill  . carvedilol (COREG) 25 MG tablet Take 12.5 mg by mouth 2 (two) times daily with a meal.     . furosemide (LASIX) 40 MG tablet Take one tablet (40 mg) by mouth once daily    . mirtazapine (REMERON SOL-TAB) 15 MG disintegrating tablet Take 15 mg by mouth at bedtime.     . Naproxen Sodium (ALEVE PO) Take by mouth as needed.    Marland Kitchen NITROSTAT 0.4 MG SL tablet Place 0.4 mg under the tongue as needed.     . RaNITidine HCl (ZANTAC PO) Take 1 tablet by mouth daily.    . simvastatin (ZOCOR) 40 MG tablet Take 40 mg by mouth at bedtime.      . Tamsulosin HCl (FLOMAX) 0.4 MG CAPS Take 0.4 mg by mouth every evening.     . vitamin B-12 (CYANOCOBALAMIN) 1000 MCG tablet Take 1,000 mcg by mouth every morning.     . warfarin (COUMADIN) 5 MG tablet Take 5 mg by mouth daily. As directed     No current facility-administered medications for this visit.     No Known Allergies  Review of Systems negative except from HPI and PMH  Physical Exam  Ht 5\' 9"  (1.753 m)   Wt 179 lb 3.2 oz (81.3 kg)   BMI 26.46 kg/m  Well developed and nourished in no acute distress HENT normal Neck supple with JVP-flat Carotids brisk and full without bruits Clear Regular rate and rhythm, 2/6 Abd-soft with active BS without hepatomegaly No Clubbing cyanosis 2-3+edema Skin-warm and dry A & Oriented  Grossly normal sensory and motor function     ECG demonstrates atrial fibrillation with intermittent V pacing  Assessment and  Plan  Atrial fibrillation-permanent  Complete heart block  Hypertension  Ischemic Cardiomyopathy   Aortic Valve replacement   Pacemaker-St. Jude    BP well controlled   I was surprised that his orthostatics were not more abnormal.  I have encouraged in the use of his rolling walker so is to avoid falls.  Edema is stable.  Device is approaching ERI.  He will be checked remotely in a month.  No symptoms of ischemia.  Aortic valve is  stable at last echo  On anticoagulation we will repeat his CBC

## 2017-11-04 LAB — CBC WITH DIFFERENTIAL/PLATELET
BASOS ABS: 0 10*3/uL (ref 0.0–0.2)
BASOS: 0 %
EOS (ABSOLUTE): 0.2 10*3/uL (ref 0.0–0.4)
Eos: 3 %
Hematocrit: 37.3 % — ABNORMAL LOW (ref 37.5–51.0)
Hemoglobin: 12.3 g/dL — ABNORMAL LOW (ref 13.0–17.7)
IMMATURE GRANS (ABS): 0 10*3/uL (ref 0.0–0.1)
Immature Granulocytes: 0 %
LYMPHS: 18 %
Lymphocytes Absolute: 0.9 10*3/uL (ref 0.7–3.1)
MCH: 30.2 pg (ref 26.6–33.0)
MCHC: 33 g/dL (ref 31.5–35.7)
MCV: 92 fL (ref 79–97)
MONOCYTES: 6 %
Monocytes Absolute: 0.3 10*3/uL (ref 0.1–0.9)
NEUTROS ABS: 3.8 10*3/uL (ref 1.4–7.0)
Neutrophils: 73 %
PLATELETS: 77 10*3/uL — AB (ref 150–379)
RBC: 4.07 x10E6/uL — ABNORMAL LOW (ref 4.14–5.80)
RDW: 13.8 % (ref 12.3–15.4)
WBC: 5.2 10*3/uL (ref 3.4–10.8)

## 2017-11-04 LAB — BASIC METABOLIC PANEL
BUN/Creatinine Ratio: 17 (ref 10–24)
BUN: 30 mg/dL (ref 10–36)
CO2: 20 mmol/L (ref 20–29)
Calcium: 9.2 mg/dL (ref 8.6–10.2)
Chloride: 107 mmol/L — ABNORMAL HIGH (ref 96–106)
Creatinine, Ser: 1.81 mg/dL — ABNORMAL HIGH (ref 0.76–1.27)
GFR calc Af Amer: 36 mL/min/{1.73_m2} — ABNORMAL LOW (ref >59)
GFR calc non Af Amer: 31 mL/min/{1.73_m2} — ABNORMAL LOW (ref >59)
GLUCOSE: 105 mg/dL — AB (ref 65–99)
Potassium: 5 mmol/L (ref 3.5–5.2)
SODIUM: 143 mmol/L (ref 134–144)

## 2017-11-04 LAB — PROTIME-INR
INR: 2.7 — ABNORMAL HIGH (ref 0.8–1.2)
Prothrombin Time: 26.7 s — ABNORMAL HIGH (ref 9.1–12.0)

## 2017-11-05 ENCOUNTER — Other Ambulatory Visit: Payer: Self-pay

## 2017-11-05 DIAGNOSIS — Z79899 Other long term (current) drug therapy: Secondary | ICD-10-CM

## 2017-12-01 ENCOUNTER — Other Ambulatory Visit: Payer: Medicare Other

## 2017-12-01 DIAGNOSIS — Z79899 Other long term (current) drug therapy: Secondary | ICD-10-CM | POA: Diagnosis not present

## 2017-12-01 LAB — BASIC METABOLIC PANEL
BUN / CREAT RATIO: 14 (ref 10–24)
BUN: 25 mg/dL (ref 10–36)
CHLORIDE: 106 mmol/L (ref 96–106)
CO2: 21 mmol/L (ref 20–29)
Calcium: 9.5 mg/dL (ref 8.6–10.2)
Creatinine, Ser: 1.79 mg/dL — ABNORMAL HIGH (ref 0.76–1.27)
GFR, EST AFRICAN AMERICAN: 37 mL/min/{1.73_m2} — AB (ref >59)
GFR, EST NON AFRICAN AMERICAN: 32 mL/min/{1.73_m2} — AB (ref >59)
Glucose: 125 mg/dL — ABNORMAL HIGH (ref 65–99)
Potassium: 4.3 mmol/L (ref 3.5–5.2)
Sodium: 141 mmol/L (ref 134–144)

## 2017-12-04 ENCOUNTER — Ambulatory Visit (INDEPENDENT_AMBULATORY_CARE_PROVIDER_SITE_OTHER): Payer: Medicare Other | Admitting: *Deleted

## 2017-12-04 DIAGNOSIS — I442 Atrioventricular block, complete: Secondary | ICD-10-CM

## 2017-12-04 NOTE — Progress Notes (Signed)
Remote pacemaker transmission.   

## 2017-12-07 ENCOUNTER — Encounter: Payer: Self-pay | Admitting: Cardiology

## 2017-12-16 LAB — CUP PACEART REMOTE DEVICE CHECK
Battery Voltage: 2.59 V
Implantable Lead Implant Date: 20101122
Implantable Lead Location: 753858
Implantable Lead Location: 753859
Implantable Lead Location: 753860
Lead Channel Pacing Threshold Amplitude: 1 V
Lead Channel Pacing Threshold Pulse Width: 0.5 ms
Lead Channel Sensing Intrinsic Amplitude: 12 mV
Lead Channel Setting Pacing Amplitude: 2 V
Lead Channel Setting Pacing Amplitude: 2 V
MDC IDC LEAD IMPLANT DT: 20101122
MDC IDC LEAD IMPLANT DT: 20101122
MDC IDC MSMT BATTERY REMAINING LONGEVITY: 1 mo
MDC IDC MSMT BATTERY REMAINING PERCENTAGE: 0.5 %
MDC IDC MSMT LEADCHNL LV IMPEDANCE VALUE: 480 Ohm
MDC IDC MSMT LEADCHNL LV PACING THRESHOLD PULSEWIDTH: 0.8 ms
MDC IDC MSMT LEADCHNL RV IMPEDANCE VALUE: 450 Ohm
MDC IDC MSMT LEADCHNL RV PACING THRESHOLD AMPLITUDE: 0.625 V
MDC IDC PG IMPLANT DT: 20101122
MDC IDC SESS DTM: 20190301070249
MDC IDC SET LEADCHNL LV PACING PULSEWIDTH: 0.8 ms
MDC IDC SET LEADCHNL RV PACING PULSEWIDTH: 0.5 ms
MDC IDC SET LEADCHNL RV SENSING SENSITIVITY: 2 mV
Pulse Gen Model: 3210
Pulse Gen Serial Number: 2317387

## 2017-12-28 ENCOUNTER — Telehealth: Payer: Self-pay | Admitting: Internal Medicine

## 2017-12-28 NOTE — Telephone Encounter (Signed)
New message    Pt c/o Shortness Of Breath: STAT if SOB developed within the last 24 hours or pt is noticeably SOB on the phone  1. Are you currently SOB (can you hear that pt is SOB on the phone)?  Yes   2. How long have you been experiencing SOB? A week   3. Are you SOB when sitting or when up moving around?  Both   4. Are you currently experiencing any other symptoms? Ankles are swollen , took fluid pill for it, thinks it has something to do with his device needing to be changed

## 2017-12-28 NOTE — Telephone Encounter (Signed)
Spoke with patient and advised him to take an extra dose of Lasix, restrict salt and fluids. If he continues to feel short of breath and swollen, call back. I also let him know that he is not ERI just yet, so the pacemaker is not what is causing his symptoms. He is HOH, but verbalized understanding and had no additional questions.

## 2017-12-28 NOTE — Telephone Encounter (Signed)
Remote Reviewed. Presenting EGM Bi-VP, No arrhythmias Device has not tripped ERI yet.

## 2017-12-28 NOTE — Telephone Encounter (Signed)
Spoke w/ pt and requested that he send a remote transmission w/ his home monitor. Informed pt that once the remote transmission is received a Device Tech RN will review and call back w/ the results. Pt verbalized understanding.

## 2017-12-29 ENCOUNTER — Other Ambulatory Visit: Payer: Self-pay | Admitting: Internal Medicine

## 2017-12-29 MED ORDER — FUROSEMIDE 40 MG PO TABS: ORAL_TABLET | ORAL | 8 refills | Status: AC

## 2017-12-29 NOTE — Telephone Encounter (Signed)
Pt's medication was sent to pt's pharmacy as requested. Confirmation received.  °

## 2018-01-04 ENCOUNTER — Telehealth: Payer: Self-pay | Admitting: Internal Medicine

## 2018-01-04 ENCOUNTER — Telehealth: Payer: Self-pay | Admitting: *Deleted

## 2018-01-04 ENCOUNTER — Telehealth: Payer: Self-pay | Admitting: Cardiology

## 2018-01-04 ENCOUNTER — Ambulatory Visit (INDEPENDENT_AMBULATORY_CARE_PROVIDER_SITE_OTHER): Payer: Medicare Other | Admitting: *Deleted

## 2018-01-04 DIAGNOSIS — I442 Atrioventricular block, complete: Secondary | ICD-10-CM

## 2018-01-04 LAB — CUP PACEART REMOTE DEVICE CHECK
Date Time Interrogation Session: 20190331071357
Implantable Lead Implant Date: 20101122
Implantable Lead Location: 753860
Lead Channel Impedance Value: 510 Ohm
Lead Channel Pacing Threshold Amplitude: 0.625 V
Lead Channel Pacing Threshold Amplitude: 1 V
Lead Channel Pacing Threshold Pulse Width: 0.5 ms
Lead Channel Sensing Intrinsic Amplitude: 12 mV
Lead Channel Setting Pacing Amplitude: 2 V
Lead Channel Setting Pacing Amplitude: 2 V
Lead Channel Setting Pacing Pulse Width: 0.8 ms
MDC IDC LEAD IMPLANT DT: 20101122
MDC IDC LEAD IMPLANT DT: 20101122
MDC IDC LEAD LOCATION: 753858
MDC IDC LEAD LOCATION: 753859
MDC IDC MSMT BATTERY REMAINING LONGEVITY: 0 mo
MDC IDC MSMT BATTERY VOLTAGE: 2.57 V
MDC IDC MSMT LEADCHNL LV PACING THRESHOLD PULSEWIDTH: 0.8 ms
MDC IDC MSMT LEADCHNL RV IMPEDANCE VALUE: 450 Ohm
MDC IDC PG IMPLANT DT: 20101122
MDC IDC PG SERIAL: 2317387
MDC IDC SET LEADCHNL RV PACING PULSEWIDTH: 0.5 ms
MDC IDC SET LEADCHNL RV SENSING SENSITIVITY: 2 mV

## 2018-01-04 NOTE — Telephone Encounter (Signed)
Home remote monitoring alert- CRTP ERI 01/02/18. Mr. Kilgore made aware. I will check with SK when he returns to see if Mr. Atayde needs in-office follow up prior to generator replacement. He was seen by Dr. Caryl Comes 11/03/17.

## 2018-01-04 NOTE — Telephone Encounter (Signed)
New Message ° ° ° °1. Has your device fired? no ° °2. Is you device beeping? no ° °3. Are you experiencing draining or swelling at device site? no ° °4. Are you calling to see if we received your device transmission? yes ° °5. Have you passed out? no ° ° ° °Please route to Device Clinic Pool ° °

## 2018-01-04 NOTE — Telephone Encounter (Signed)
Spoke with pt and reminded pt of remote transmission that is due today. Pt verbalized understanding.   

## 2018-01-04 NOTE — Telephone Encounter (Signed)
Ms. Fill made aware that Mr. Bufkin' transmission was received this morning and that I spoke with Mr. Privitera about his pacemaker battery needing replacement within the next 3 months. She verbalizes understanding.

## 2018-01-05 DIAGNOSIS — I442 Atrioventricular block, complete: Secondary | ICD-10-CM

## 2018-01-05 NOTE — Progress Notes (Signed)
Remote pacemaker transmission.   

## 2018-01-05 NOTE — Telephone Encounter (Signed)
Pt scheduled for change out April 17 @ 2:30pm. He will be coming in Monday April 15th for lab draw to check INR. At that time, we may be able to schedule him into a DOD spot if available and/or if Dr Caryl Comes needs to see him in clinic.  Pt and his daughter have been concerned with his swelling, fatigue and weakness. I checked with the Device clinic, his pacemaker is functioning the same and should not be causing his fatigue. I offered his daughter, Loreen Freud, for me to speak with DOD or try and get him in with another provider. She stated he does not want to leave the house. I suggested she could take him to his PCP who is closer to his home. His PCP should be able to address his swelling and fatigue. Based off his last lab values, I believed he should be seen by his PCP before taking additional doses of lasix.   She was agreeable and stated she will talk with her dad about getting him to visit the doctor.   Instructional letter to be sent to patient regarding his procedure.

## 2018-01-08 DIAGNOSIS — Z7901 Long term (current) use of anticoagulants: Secondary | ICD-10-CM | POA: Diagnosis not present

## 2018-01-08 DIAGNOSIS — I482 Chronic atrial fibrillation: Secondary | ICD-10-CM | POA: Diagnosis not present

## 2018-01-11 ENCOUNTER — Other Ambulatory Visit: Payer: Self-pay | Admitting: Internal Medicine

## 2018-01-11 DIAGNOSIS — I482 Chronic atrial fibrillation, unspecified: Secondary | ICD-10-CM

## 2018-01-14 ENCOUNTER — Other Ambulatory Visit (HOSPITAL_COMMUNITY): Payer: Medicare Other

## 2018-01-14 NOTE — Telephone Encounter (Signed)
noted 

## 2018-01-18 ENCOUNTER — Other Ambulatory Visit: Payer: Medicare Other | Admitting: *Deleted

## 2018-01-18 DIAGNOSIS — I442 Atrioventricular block, complete: Secondary | ICD-10-CM

## 2018-01-18 LAB — PROTIME-INR
INR: 3.9 — AB (ref 0.8–1.2)
PROTHROMBIN TIME: 41.8 s — AB (ref 9.1–12.0)

## 2018-01-18 LAB — CBC WITH DIFFERENTIAL/PLATELET
BASOS: 0 %
Basophils Absolute: 0 10*3/uL (ref 0.0–0.2)
EOS (ABSOLUTE): 0.1 10*3/uL (ref 0.0–0.4)
Eos: 1 %
Hematocrit: 31 % — ABNORMAL LOW (ref 37.5–51.0)
Hemoglobin: 10.9 g/dL — ABNORMAL LOW (ref 13.0–17.7)
Lymphocytes Absolute: 0.5 10*3/uL — ABNORMAL LOW (ref 0.7–3.1)
Lymphs: 10 %
MCH: 29.3 pg (ref 26.6–33.0)
MCHC: 35.2 g/dL (ref 31.5–35.7)
MCV: 83 fL (ref 79–97)
MONOS ABS: 0.4 10*3/uL (ref 0.1–0.9)
Monocytes: 9 %
NEUTROS ABS: 3.5 10*3/uL (ref 1.4–7.0)
Neutrophils: 80 %
PLATELETS: 65 10*3/uL — AB (ref 150–379)
RBC: 3.72 x10E6/uL — ABNORMAL LOW (ref 4.14–5.80)
RDW: 14.6 % (ref 12.3–15.4)
WBC: 4.5 10*3/uL (ref 3.4–10.8)

## 2018-01-18 LAB — BASIC METABOLIC PANEL
BUN/Creatinine Ratio: 18 (ref 10–24)
BUN: 41 mg/dL — ABNORMAL HIGH (ref 10–36)
CHLORIDE: 103 mmol/L (ref 96–106)
CO2: 24 mmol/L (ref 20–29)
Calcium: 9 mg/dL (ref 8.6–10.2)
Creatinine, Ser: 2.34 mg/dL — ABNORMAL HIGH (ref 0.76–1.27)
GFR calc Af Amer: 27 mL/min/{1.73_m2} — ABNORMAL LOW (ref >59)
GFR, EST NON AFRICAN AMERICAN: 23 mL/min/{1.73_m2} — AB (ref >59)
Glucose: 110 mg/dL — ABNORMAL HIGH (ref 65–99)
POTASSIUM: 4.3 mmol/L (ref 3.5–5.2)
SODIUM: 135 mmol/L (ref 134–144)

## 2018-01-20 ENCOUNTER — Ambulatory Visit (HOSPITAL_COMMUNITY)
Admission: RE | Admit: 2018-01-20 | Discharge: 2018-01-20 | Disposition: A | Discharge status: Home / Self Care | Payer: Medicare Other | Source: Ambulatory Visit | Attending: Internal Medicine | Admitting: Internal Medicine

## 2018-01-20 ENCOUNTER — Ambulatory Visit (HOSPITAL_COMMUNITY)
Admission: RE | Disposition: A | Discharge status: Home / Self Care | Payer: Medicare Other | Source: Ambulatory Visit | Attending: Internal Medicine

## 2018-01-20 DIAGNOSIS — Z7951 Long term (current) use of inhaled steroids: Secondary | ICD-10-CM | POA: Diagnosis not present

## 2018-01-20 DIAGNOSIS — I442 Atrioventricular block, complete: Secondary | ICD-10-CM | POA: Diagnosis not present

## 2018-01-20 DIAGNOSIS — I5043 Acute on chronic combined systolic (congestive) and diastolic (congestive) heart failure: Secondary | ICD-10-CM | POA: Insufficient documentation

## 2018-01-20 DIAGNOSIS — Z951 Presence of aortocoronary bypass graft: Secondary | ICD-10-CM | POA: Insufficient documentation

## 2018-01-20 DIAGNOSIS — I495 Sick sinus syndrome: Secondary | ICD-10-CM | POA: Diagnosis present

## 2018-01-20 DIAGNOSIS — K219 Gastro-esophageal reflux disease without esophagitis: Secondary | ICD-10-CM | POA: Diagnosis not present

## 2018-01-20 DIAGNOSIS — I255 Ischemic cardiomyopathy: Secondary | ICD-10-CM | POA: Diagnosis not present

## 2018-01-20 DIAGNOSIS — I2510 Atherosclerotic heart disease of native coronary artery without angina pectoris: Secondary | ICD-10-CM | POA: Insufficient documentation

## 2018-01-20 DIAGNOSIS — I110 Hypertensive heart disease with heart failure: Secondary | ICD-10-CM | POA: Insufficient documentation

## 2018-01-20 DIAGNOSIS — I482 Chronic atrial fibrillation: Secondary | ICD-10-CM | POA: Diagnosis not present

## 2018-01-20 DIAGNOSIS — Z45010 Encounter for checking and testing of cardiac pacemaker pulse generator [battery]: Secondary | ICD-10-CM

## 2018-01-20 DIAGNOSIS — Z953 Presence of xenogenic heart valve: Secondary | ICD-10-CM | POA: Insufficient documentation

## 2018-01-20 DIAGNOSIS — Z87891 Personal history of nicotine dependence: Secondary | ICD-10-CM | POA: Insufficient documentation

## 2018-01-20 DIAGNOSIS — Z950 Presence of cardiac pacemaker: Secondary | ICD-10-CM | POA: Diagnosis present

## 2018-01-20 HISTORY — PX: BIV PACEMAKER GENERATOR CHANGEOUT: EP1198

## 2018-01-20 LAB — PROTIME-INR
INR: 2.73
Prothrombin Time: 28.7 seconds — ABNORMAL HIGH (ref 11.4–15.2)

## 2018-01-20 LAB — SURGICAL PCR SCREEN
MRSA, PCR: NEGATIVE
STAPHYLOCOCCUS AUREUS: POSITIVE — AB

## 2018-01-20 SURGERY — BIV PACEMAKER GENERATOR CHANGEOUT

## 2018-01-20 MED ORDER — MUPIROCIN 2 % EX OINT
TOPICAL_OINTMENT | CUTANEOUS | Status: AC
  Administered 2018-01-20: 15:00:00
  Filled 2018-01-20: qty 22

## 2018-01-20 MED ORDER — CEFAZOLIN SODIUM-DEXTROSE 2-4 GM/100ML-% IV SOLN
2.0000 g | INTRAVENOUS | Status: AC
  Administered 2018-01-20: 2 g via INTRAVENOUS

## 2018-01-20 MED ORDER — SODIUM CHLORIDE 0.9 % IV SOLN
INTRAVENOUS | Status: DC
  Administered 2018-01-20: 15:00:00 via INTRAVENOUS

## 2018-01-20 MED ORDER — LIDOCAINE HCL (PF) 1 % IJ SOLN
INTRAMUSCULAR | Status: DC | PRN
  Administered 2018-01-20: 50 mL

## 2018-01-20 MED ORDER — SODIUM CHLORIDE 0.9 % IV SOLN: INTRAVENOUS | Status: AC

## 2018-01-20 MED ORDER — CEFAZOLIN SODIUM-DEXTROSE 2-4 GM/100ML-% IV SOLN
INTRAVENOUS | Status: AC
  Filled 2018-01-20: qty 100

## 2018-01-20 MED ORDER — ONDANSETRON HCL 4 MG/2ML IJ SOLN: 4.0000 mg | Freq: Four times a day (QID) | INTRAMUSCULAR | Status: DC | PRN

## 2018-01-20 MED ORDER — CHLORHEXIDINE GLUCONATE 4 % EX LIQD: 60.0000 mL | Freq: Once | CUTANEOUS | Status: DC

## 2018-01-20 MED ORDER — SODIUM CHLORIDE 0.9 % IV SOLN
80.0000 mg | INTRAVENOUS | Status: AC
  Administered 2018-01-20: 80 mg

## 2018-01-20 MED ORDER — GENTAMICIN SULFATE 40 MG/ML IJ SOLN
INTRAMUSCULAR | Status: AC
  Filled 2018-01-20: qty 2

## 2018-01-20 MED ORDER — LIDOCAINE HCL (PF) 1 % IJ SOLN
INTRAMUSCULAR | Status: AC
  Filled 2018-01-20: qty 30

## 2018-01-20 MED ORDER — ACETAMINOPHEN 325 MG PO TABS: 325.0000 mg | ORAL_TABLET | ORAL | Status: DC | PRN

## 2018-01-20 MED ORDER — MUPIROCIN 2 % EX OINT: 1.0000 "application " | TOPICAL_OINTMENT | Freq: Once | CUTANEOUS | Status: DC

## 2018-01-20 SURGICAL SUPPLY — 6 items
CABLE SURGICAL S-101-97-12 (CABLE) ×2 IMPLANT
HEMOSTAT SURGICEL 2X4 FIBR (HEMOSTASIS) ×2 IMPLANT
PACEMAKER ALLR CRT-P RF PM3222 (Pacemaker) IMPLANT
PAD DEFIB LIFELINK (PAD) ×2 IMPLANT
PPM ALLURE CRT-P RF PM3222 (Pacemaker) ×3 IMPLANT
TRAY PACEMAKER INSERTION (PACKS) ×2 IMPLANT

## 2018-01-20 NOTE — H&P (Signed)
Patient Care Team: Deland Pretty, MD as PCP - General (Internal Medicine)   HPI  [name] is a 84 y.o. male admitted for replacement of of generator  Previously CRT P. implantation for symptomatic bradycardia in context of ischemic heart disease prior bypass surgery status post bioprosthetic aortic valve and ejection fraction of 40%.  NOw with permanent Afib and complete heart block   Echo 8/18 demonstrated EF 40-45% mean gradient was 11           Date Cr K Hgb Plts  7/18 1.56 4.2 12.6 63  2/19 1.79   77  4/19  2.34 4.3   65   Functional status is stable with leg weakness but no chest pain   2+ edema but this is better  Also worsening shortness of breath with exertion       Past Medical History:  Diagnosis Date  . Anemia   . Anemia of renal disease 11/20/2011  . Anemia, iron deficiency 11/20/2011  . Aortic stenosis    23-mm St. Jude Biocor porcine bioprosthetic at time of CABG 10/2006  . Atrial fibrillation (HCC)    Paroxysmal, on Coumadin  . Blood transfusion    no hx of reaction to transfusions"  . CAD (coronary artery disease)    CABGx6 10/2006  . Colon cancer [facility])    s/p colon resection  . Diaphragmatic stimulation of the LV lead 08/26/2013  . GERD (gastroesophageal reflux disease)   . Hairy cell leukemia ([place]) 11/12/2012  . Hypertension   . Ischemic cardiomyopathy    EF 40% previously s/p BiV St. Jude PPM. Most recent echo 08/2009 - EF could not be estimated but appeared to be low-normal.  . Prostate cancer ([place])   . Shortness of breath   . Thrombocytopenia ([place]) 11/20/2011    Past Surgical History:  Procedure Laterality Date  . AORTIC VALVE REPLACEMENT    . APPENDECTOMY    . CARDIAC SURGERY    . CORONARY ARTERY BYPASS GRAFT    . HEMICOLECTOMY  2000   right  . INSERT / REPLACE / REMOVE PACEMAKER     biventricular, St. Jude  . left eye cataract extraction      No current facility-administered medications for this encounter.     No  Known Allergies    Social History   Tobacco Use  . Smoking status: Former Smoker    Types: Pipe    Last attempt to quit: 07/23/1981    Years since quitting: 36.5  . Smokeless tobacco: Never Used  Substance Use Topics  . Alcohol use: No  . Drug use: No     Family History  Problem Relation Age of Onset  . Heart disease Mother      Current Meds  Medication Sig  . carvedilol (COREG) 25 MG tablet Take 25 mg by mouth 2 (two) times daily with a meal.   . furosemide (LASIX) 40 MG tablet Take one tablet (40 mg) by mouth once daily (Patient taking differently: Take 40 mg by mouth daily. )  . mirtazapine (REMERON SOL-TAB) 15 MG disintegrating tablet Take 15 mg by mouth at bedtime.   . naproxen sodium (ALEVE) 220 MG tablet Take 220 mg by mouth daily as needed (for pain or headache).   Marland Kitchen NITROSTAT 0.4 MG SL tablet Place 0.4 mg under the tongue every 5 (five) minutes as needed for chest pain.   . ranitidine (ZANTAC) 150 MG tablet Take 150 mg by mouth 2 (two)  times daily.   . simvastatin (ZOCOR) 40 MG tablet Take 20 mg by mouth at bedtime.   . Tamsulosin HCl (FLOMAX) 0.4 MG CAPS Take 0.4 mg by mouth every evening.   . vitamin B-12 (CYANOCOBALAMIN) 1000 MCG tablet Take 1,000 mcg by mouth 3 (three) times a week.  . warfarin (COUMADIN) 5 MG tablet Take 2.5-5 mg by mouth See admin instructions. Take 2.5 mg by mouth daily on Monday and Thursday. Take 5 mg by mouth daily on all other days.     Review of Systems negative except from HPI and PMH  Physical Exam BP (!) 118/54   Pulse 63   Temp 97.7 F (36.5 C) (Oral)   Resp 18   Ht 5\' 10"  (1.778 m)   Wt 170 lb (77.1 kg)   SpO2 99%   BMI 24.39 kg/m  Well developed and well nourished in no acute distress HENT normal E scleral and icterus clear Neck Supple carotids brisk and full Clear to ausculation Regular rate and rhythm, 2/6 m Soft with active bowel sounds No clubbing cyanosis 2+ Edema Alert and oriented, grossly normal motor and  sensory function Skin Warm and Dry     Assessment and  Plan  Atrial fibrillation-permanent  Complete heart block  Congestive heart failure acute chronic mixed systolic diastolic  Hypertension  Ischemic Cardiomyopathy   Aortic Valve replacement   Pacemaker CRT -St. Jude     His device has reached ERI and has lost rate response and decreased HR  This may be contributing to his dyspnea on exertion and hopefully will be addressed with device replacement  We have reviewed the benefits and risks of generator replacement.  These include but are not limited to lead fracture and infection.  The patient understands, agrees and is willing to proceed.

## 2018-01-20 NOTE — Discharge Instructions (Signed)
Mupirocin nasal ointment °What is this medicine? °MUPIROCIN CALCIUM (myoo PEER oh sin KAL see um) is an antibiotic. It is used inside the nose to treat infections that are caused by certain bacteria. This helps prevent the spread of infection to patients and health care workers during outbreaks at institutions. °This medicine may be used for other purposes; ask your health care provider or pharmacist if you have questions. °COMMON BRAND NAME(S): Bactroban °What should I tell my health care provider before I take this medicine? °They need to know if you have any of these conditions: °-an unusual or allergic reaction to mupirocin, other medicines, foods, dyes, or preservatives °-pregnant or trying to get pregnant °-breast-feeding °How should I use this medicine? °This medicine is only for use inside the nose. Follow the directions on the prescription label. Wash your hands before and after use. Squeeze half the contents of a single-use tube into one nostril, then squeeze the other half into the other nostril. Press the sides of your nose together and gently massage after application to spread the ointment throughout the nostrils. Do not use your medicine more often than directed. Finish the full course of medicine prescribed by your doctor or health care professional even if you think your condition is better. °Talk to your pediatrician regarding the use of this medicine in children. Special care may be needed. °Overdosage: If you think you have taken too much of this medicine contact a poison control center or emergency room at once. °NOTE: This medicine is only for you. Do not share this medicine with others. °What if I miss a dose? °If you miss a dose, take it as soon as you can. If it is almost time for your next dose, take only that dose. Do not take double or extra doses. °What may interact with this medicine? °Interactions are not expected. Do not use any other nose products without telling your doctor or  health care professional. °This list may not describe all possible interactions. Give your health care provider a list of all the medicines, herbs, non-prescription drugs, or dietary supplements you use. Also tell them if you smoke, drink alcohol, or use illegal drugs. Some items may interact with your medicine. °What should I watch for while using this medicine? °If your nose is severely irritated, burning or stinging from use of this medicine, stop using it and contact your doctor or health care professional. °Do not get this medicine in your eyes. If you do, rinse out with plenty of cool tap water. °What side effects may I notice from receiving this medicine? °Side effects that you should report to your doctor or health care professional as soon as possible: °-severe irritation, burning, stinging, or pain °Side effects that usually do not require medical attention (report to your doctor or health care professional if they continue or are bothersome): °-altered taste °-cough °-headache °-skin itching °-sore throat °-stuffy or runny nose °This list may not describe all possible side effects. Call your doctor for medical advice about side effects. You may report side effects to FDA at 1-800-FDA-1088. °Where should I keep my medicine? °Keep out of the reach of children. °Store at room temperature between 15 and 30 degrees C (59 and 86 degrees F). Do not refrigerate. One tube of ointment is for single use in both nostrils. Throw away after use. °NOTE: This sheet is a summary. It may not cover all possible information. If you have questions about this medicine, talk to your doctor, pharmacist, or   health care provider. °© 2018 Elsevier/Gold Standard (2008-04-10 14:36:10) °Pacemaker Battery Change, Care After °This sheet gives you information about how to care for yourself after your procedure. Your health care provider may also give you more specific instructions. If you have problems or questions, contact your health  care provider. °What can I expect after the procedure? °After your procedure, it is common to have: °· Pain or soreness at the site where the pacemaker was inserted. °· Swelling at the site where the pacemaker was inserted. ° °Follow these instructions at home: °Incision care °· Keep the incision clean and dry. °? Do not take baths, swim, or use a hot tub until your health care provider approves. °? You may shower the day after your procedure, or as directed by your health care provider. °? Pat the area dry with a clean towel. Do not rub the area. This may cause bleeding. °· Follow instructions from your health care provider about how to take care of your incision. Make sure you: °? Wash your hands with soap and water before you change your bandage (dressing). If soap and water are not available, use hand sanitizer. °? Change your dressing as told by your health care provider. °? Leave stitches (sutures), skin glue, or adhesive strips in place. These skin closures may need to stay in place for 2 weeks or longer. If adhesive strip edges start to loosen and curl up, you may trim the loose edges. Do not remove adhesive strips completely unless your health care provider tells you to do that. °· Check your incision area every day for signs of infection. Check for: °? More redness, swelling, or pain. °? More fluid or blood. °? Warmth. °? Pus or a bad smell. °Activity °· Do not lift anything that is heavier than 10 lb (4.5 kg) until your health care provider says it is okay to do so. °· For the first 2 weeks, or as long as told by your health care provider: °? Avoid lifting your left arm higher than your shoulder. °? Be gentle when you move your arms over your head. It is okay to raise your arm to comb your hair. °? Avoid strenuous exercise. °· Ask your health care provider when it is okay to: °? Resume your normal activities. °? Return to work or school. °? Resume sexual activity. °Eating and drinking °· Eat a  heart-healthy diet. This should include plenty of fresh fruits and vegetables, whole grains, low-fat dairy products, and lean protein like chicken and fish. °· Limit alcohol intake to no more than 1 drink a day for non-pregnant women and 2 drinks a day for men. One drink equals 12 oz of beer, 5 oz of wine, or 1½ oz of hard liquor. °· Check ingredients and nutrition facts on packaged foods and beverages. Avoid the following types of food: °? Food that is high in salt (sodium). °? Food that is high in saturated fat, like full-fat dairy or red meat. °? Food that is high in trans fat, like fried food. °? Food and drinks that are high in sugar. °Lifestyle °· Do not use any products that contain nicotine or tobacco, such as cigarettes and e-cigarettes. If you need help quitting, ask your health care provider. °· Take steps to manage and control your weight. °· Get regular exercise. Aim for 150 minutes of moderate-intensity exercise (such as walking or yoga) or 75 minutes of vigorous exercise (such as running or swimming) each week. °· Manage other health problems,   such as diabetes or high blood pressure. Ask your health care provider how you can manage these conditions. °General instructions °· Do not drive for 24 hours after your procedure if you were given a medicine to help you relax (sedative). °· Take over-the-counter and prescription medicines only as told by your health care provider. °· Avoid putting pressure on the area where the pacemaker was placed. °· If you need an MRI after your pacemaker has been placed, be sure to tell the health care provider who orders the MRI that you have a pacemaker. °· Avoid close and prolonged exposure to electrical devices that have strong magnetic fields. These include: °? Cell phones. Avoid keeping them in a pocket near the pacemaker, and try using the ear opposite the pacemaker. °? MP3 players. °? Household appliances, like microwaves. °? Metal detectors. °? Electric  generators. °? High-tension wires. °· Keep all follow-up visits as directed by your health care provider. This is important. °Contact a health care provider if: °· You have pain at the incision site that is not relieved by over-the-counter or prescription medicines. °· You have any of these around your incision site or coming from it: °? More redness, swelling, or pain. °? Fluid or blood. °? Warmth to the touch. °? Pus or a bad smell. °· You have a fever. °· You feel brief, occasional palpitations, light-headedness, or any symptoms that you think might be related to your heart. °Get help right away if: °· You experience chest pain that is different from the pain at the pacemaker site. °· You develop a red streak that extends above or below the incision site. °· You experience shortness of breath. °· You have palpitations or an irregular heartbeat. °· You have light-headedness that does not go away quickly. °· You faint or have dizzy spells. °· Your pulse suddenly drops or increases rapidly and does not return to normal. °· You begin to gain weight and your legs and ankles swell. °Summary °· After your procedure, it is common to have pain, soreness, and some swelling where the pacemaker was inserted. °· Make sure to keep your incision clean and dry. Follow instructions from your health care provider about how to take care of your incision. °· Check your incision every day for signs of infection, such as more pain or swelling, pus or a bad smell, warmth, or leaking fluid and blood. °· Avoid strenuous exercise and lifting your left arm higher than your shoulder for 2 weeks, or as long as told by your health care provider. °This information is not intended to replace advice given to you by your health care provider. Make sure you discuss any questions you have with your health care provider. °Document Released: 07/13/2013 Document Revised: 08/14/2016 Document Reviewed: 08/14/2016 °Elsevier Interactive Patient Education  © 2017 Elsevier Inc. ° °

## 2018-01-20 NOTE — Progress Notes (Signed)
Angie PA on call, pt to take coumadin 2.5 mg and call Dr Olin Pia office for further instruction. Pt/ family verbalized understanding.

## 2018-01-21 ENCOUNTER — Encounter (HOSPITAL_COMMUNITY): Payer: Self-pay | Admitting: Internal Medicine

## 2018-01-28 ENCOUNTER — Encounter: Payer: Self-pay | Admitting: Hematology & Oncology

## 2018-02-03 ENCOUNTER — Ambulatory Visit (INDEPENDENT_AMBULATORY_CARE_PROVIDER_SITE_OTHER): Payer: Medicare Other | Admitting: *Deleted

## 2018-02-03 DIAGNOSIS — I442 Atrioventricular block, complete: Secondary | ICD-10-CM

## 2018-02-03 LAB — CUP PACEART INCLINIC DEVICE CHECK
Battery Remaining Longevity: 90 mo
Battery Voltage: 3.05 V
Brady Statistic RA Percent Paced: 0 %
Implantable Lead Location: 753859
Implantable Lead Location: 753860
Lead Channel Impedance Value: 450 Ohm
Lead Channel Impedance Value: 512.5 Ohm
Lead Channel Pacing Threshold Amplitude: 0.75 V
Lead Channel Pacing Threshold Amplitude: 0.75 V
Lead Channel Pacing Threshold Amplitude: 1 V
Lead Channel Pacing Threshold Amplitude: 1 V
Lead Channel Pacing Threshold Pulse Width: 0.5 ms
Lead Channel Pacing Threshold Pulse Width: 0.5 ms
Lead Channel Setting Pacing Amplitude: 2.5 V
Lead Channel Setting Pacing Pulse Width: 0.5 ms
Lead Channel Setting Sensing Sensitivity: 4 mV
MDC IDC LEAD IMPLANT DT: 20101122
MDC IDC LEAD IMPLANT DT: 20101122
MDC IDC LEAD IMPLANT DT: 20101122
MDC IDC LEAD LOCATION: 753858
MDC IDC MSMT LEADCHNL LV PACING THRESHOLD PULSEWIDTH: 0.5 ms
MDC IDC MSMT LEADCHNL RV PACING THRESHOLD PULSEWIDTH: 0.5 ms
MDC IDC MSMT LEADCHNL RV SENSING INTR AMPL: 12 mV
MDC IDC PG IMPLANT DT: 20101122
MDC IDC PG SERIAL: 9003340
MDC IDC SESS DTM: 20190501131755
MDC IDC SET LEADCHNL LV PACING AMPLITUDE: 2.5 V
MDC IDC SET LEADCHNL RV PACING PULSEWIDTH: 0.5 ms
MDC IDC STAT BRADY RV PERCENT PACED: 88 %

## 2018-02-03 NOTE — Progress Notes (Signed)
  Wound check appointment. Attempted removal of dermabond unable to remove scab from incision site. Moderate hematoma noted, site assessed by WC recommended holding coumadin until ROV w/ SK.  Normal device function. Thresholds, sensing, and impedances consistent with implant measurements. Device programmed at chronic output values. Histogram distribution appropriate for patient and level of activity. No high ventricular rates noted. pt to return to office 02/04/18@2 :00pm to have SK assess site.

## 2018-02-03 NOTE — Patient Instructions (Signed)
HOLD Coumadin until wound re-check on Thursday Feb 04, 2018 at 2:00pm.  Keep dressing dry

## 2018-02-04 ENCOUNTER — Ambulatory Visit (INDEPENDENT_AMBULATORY_CARE_PROVIDER_SITE_OTHER): Payer: Medicare Other | Admitting: Internal Medicine

## 2018-02-04 ENCOUNTER — Encounter: Payer: Self-pay | Admitting: *Deleted

## 2018-02-04 VITALS — BP 122/58 | HR 73

## 2018-02-04 DIAGNOSIS — I442 Atrioventricular block, complete: Secondary | ICD-10-CM

## 2018-02-04 DIAGNOSIS — L03119 Cellulitis of unspecified part of limb: Secondary | ICD-10-CM

## 2018-02-04 DIAGNOSIS — R600 Localized edema: Secondary | ICD-10-CM | POA: Diagnosis not present

## 2018-02-04 DIAGNOSIS — Z950 Presence of cardiac pacemaker: Secondary | ICD-10-CM

## 2018-02-04 MED ORDER — CEPHALEXIN 500 MG PO CAPS: 500.0000 mg | ORAL_CAPSULE | Freq: Two times a day (BID) | ORAL | 0 refills | Status: DC

## 2018-02-04 NOTE — Patient Instructions (Addendum)
Medication Instructions:  -- HOLD (do not take) furosemide (Lasix) until your lab results come back.  Dr. Olin Pia nurse will call with recommendations.  -- HOLD (do not take) warfarin until Tuesday, 02/09/18.  -- START Keflex 500mg  two times daily for 7 days.   Labwork: -- Basic metabolic panel (BMET) today to assess kidney function.  Testing/Procedures: N/A  Follow-Up: -- Wound recheck on Thursday, 02/11/18 at 2:00pm.  Any Other Special Instructions Will Be Listed Below (If Applicable). -- Call Dr. Pennie Banter office regarding the wound on your left leg and to schedule your next INR check.  Let them know that your warfarin is on hold until Tuesday, 5/7, and that you are taking antibiotics.   If you need a refill on your cardiac medications before your next appointment, please call your pharmacy.

## 2018-02-04 NOTE — Progress Notes (Signed)
Hematoma recheck in clinic.  Transferred to Dr. Olin Pia schedule.  Moderate hematoma remains over device site, softer to palpation.  Incision edges approximated, scab in place, site left OTA.  No drainage noted, patient denies fever/chills.  Dr. Caryl Comes assessed site, recommended continuing to hold warfarin until Tuesday, 02/09/18.  Plan for wound recheck on Thursday, 02/11/18 while Dr. Caryl Comes is in the office.  Patient and daughter are aware to call if he develops any signs/symptoms of infection in the interim.   Dr. Caryl Comes also looked at BLE rash, reported by patient's daughter.  BLE edema noted, left leg greater than right.  Serous fluid noted to be draining from LLE calf area.  Dr. Caryl Comes applied antibiotic ointment and gauze/Tegaderm dressing to site.  Extra dressing supplies given to patient's daughter.  Keflex 500mg  BID ordered x7 days by Dr. Caryl Comes for possible cellulitis.  Encouraged f/u with PCP to further assess wound.  BMET ordered today to reassess Cr, furosemide on hold pending these results.

## 2018-02-04 NOTE — Progress Notes (Signed)
Patient Care Team: Deland Pretty, MD as PCP - General (Internal Medicine)   HPI  [name] is a 84 y.o. male is seen following CRT P. implantation for symptomatic bradycardia in this state of ischemic heart disease prior bypass surgery status post bioprosthetic aortic valve and ejection fraction of 40%.   Echo 818 demonstrated EF 40-45% mean gradient was 11 He has now permanent atrial fibrillation    He underwent device generator replacement for 01/20/2018.  He is seen today to look at his pocket but more importantly he has developed a rash over  lower extremities bilaterally with some weepage.    Date Cr K Hgb  7/18 1.56 4.2 12.6   4/19 2.34 4.3            Past Medical History:  Diagnosis Date  . Anemia   . Anemia of renal disease 11/20/2011  . Anemia, iron deficiency 11/20/2011  . Aortic stenosis    23-mm St. Jude Biocor porcine bioprosthetic at time of CABG 10/2006  . Atrial fibrillation (HCC)    Paroxysmal, on Coumadin  . Blood transfusion    no hx of reaction to transfusions"  . CAD (coronary artery disease)    CABGx6 10/2006  . Colon cancer [facility])    s/p colon resection  . Diaphragmatic stimulation of the LV lead 08/26/2013  . GERD (gastroesophageal reflux disease)   . Hairy cell leukemia ([place]) 11/12/2012  . Hypertension   . Ischemic cardiomyopathy    EF 40% previously s/p BiV St. Jude PPM. Most recent echo 08/2009 - EF could not be estimated but appeared to be low-normal.  . Prostate cancer ([place])   . Shortness of breath   . Thrombocytopenia ([place]) 11/20/2011    Past Surgical History:  Procedure Laterality Date  . AORTIC VALVE REPLACEMENT    . APPENDECTOMY    . BIV PACEMAKER GENERATOR CHANGEOUT N/A 01/20/2018   Procedure: BIV PACEMAKER GENERATOR CHANGEOUT;  Surgeon: Deboraha Sprang, MD;  Location: [place] [facility];  Service: Cardiovascular;  Laterality: N/A;  . CARDIAC SURGERY    . CORONARY ARTERY BYPASS GRAFT    . HEMICOLECTOMY  2000   right    . INSERT / REPLACE / REMOVE PACEMAKER     biventricular, St. Jude  . left eye cataract extraction      Current Outpatient Medications  Medication Sig Dispense Refill  . carvedilol (COREG) 25 MG tablet Take 25 mg by mouth 2 (two) times daily with a meal.     . furosemide (LASIX) 40 MG tablet Take one tablet (40 mg) by mouth once daily (Patient taking differently: Take 40 mg by mouth daily. ) 30 tablet 8  . mirtazapine (REMERON SOL-TAB) 15 MG disintegrating tablet Take 15 mg by mouth at bedtime.     . naproxen sodium (ALEVE) 220 MG tablet Take 220 mg by mouth daily as needed (for pain or headache).     Marland Kitchen NITROSTAT 0.4 MG SL tablet Place 0.4 mg under the tongue every 5 (five) minutes as needed for chest pain.     . ranitidine (ZANTAC) 150 MG tablet Take 150 mg by mouth 2 (two) times daily.     . simvastatin (ZOCOR) 40 MG tablet Take 20 mg by mouth at bedtime.     . Tamsulosin HCl (FLOMAX) 0.4 MG CAPS Take 0.4 mg by mouth every evening.     . vitamin B-12 (CYANOCOBALAMIN) 1000 MCG tablet Take 1,000 mcg by mouth 3 (three) times  a week.    . cephALEXin (KEFLEX) 500 MG capsule Take 1 capsule (500 mg total) by mouth 2 (two) times daily for 7 days. 14 capsule 0  . warfarin (COUMADIN) 5 MG tablet Take 2.5-5 mg by mouth See admin instructions. Take 2.5 mg by mouth daily on Monday and Thursday. Take 5 mg by mouth daily on all other days.     No current facility-administered medications for this visit.     No Known Allergies  Review of Systems negative except from HPI and PMH  Physical Exam BP (!) 122/58 (BP Location: Right Arm, Patient Position: Sitting, Cuff Size: Normal)   Pulse 73   SpO2 98%  Well developed and nourished in no acute distress HENT normal Neck supple with JVP-flat Clear Device pocket well healed; with small hematoma there is some yellowish discoloration  regular rate and rhythm, no murmurs or gallops Abd-soft with active BS No Clubbing cyanosis 2+ on the right and 3+ on  the left.  Edema  There is weeping of the posterior left leg associated with excoriation.  There is an erythematous rash over the shins bilaterally it is; it is not warm it is also rather patchy Skin- As above  A & Oriented  Grossly normal sensory and motor function     ECG demonstrates atrial fibrillation with intermittent V pacing  Assessment and  Plan  Atrial fibrillation-permanent  Complete heart block  Hypertension  Renal insufficiency Gd 4   Ischemic Cardiomyopathy   Aortic Valve replacement   Pacemaker-St. Jude  Edema   Rash  Cellulitis      The patient has concerning lesion on his leg which is weeping and a rash all erythematous is not warm but still concerning for cellulitis.  We have put a topical dressing over the lesion on his leg and started him on cephalexin x7 days.  With his worsening creatinine at change, we will recheck it today prior to making a recommendation as relates to diuretics  We spent more than 50% of our >25 min visit in face to face counseling regarding the above

## 2018-02-05 ENCOUNTER — Telehealth: Payer: Self-pay

## 2018-02-05 ENCOUNTER — Telehealth: Payer: Self-pay | Admitting: *Deleted

## 2018-02-05 LAB — BASIC METABOLIC PANEL
BUN/Creatinine Ratio: 20 (ref 10–24)
BUN: 51 mg/dL — ABNORMAL HIGH (ref 10–36)
CALCIUM: 8.8 mg/dL (ref 8.6–10.2)
CO2: 22 mmol/L (ref 20–29)
CREATININE: 2.5 mg/dL — AB (ref 0.76–1.27)
Chloride: 105 mmol/L (ref 96–106)
GFR calc non Af Amer: 21 mL/min/{1.73_m2} — ABNORMAL LOW (ref >59)
GFR, EST AFRICAN AMERICAN: 24 mL/min/{1.73_m2} — AB (ref >59)
Glucose: 110 mg/dL — ABNORMAL HIGH (ref 65–99)
POTASSIUM: 4.7 mmol/L (ref 3.5–5.2)
Sodium: 139 mmol/L (ref 134–144)

## 2018-02-05 NOTE — Telephone Encounter (Signed)
Pt understands he needs to continue to hold lasix.

## 2018-02-05 NOTE — Telephone Encounter (Signed)
Spoke with patient and wife to clarify Keflex instructions.  Patient reports he picked up the medication last night and took his first dose then.  He is aware to take BID until he has finished all of the pills in the bottle.  Advised him to call if he has any issues prior to wound recheck on Thursday, 5/9.  Patient is appreciative of call and denies additional questions or concerns at this time.

## 2018-02-08 ENCOUNTER — Ambulatory Visit: Payer: Medicare Other | Admitting: Hematology & Oncology

## 2018-02-08 ENCOUNTER — Other Ambulatory Visit: Payer: Medicare Other

## 2018-02-11 ENCOUNTER — Ambulatory Visit (INDEPENDENT_AMBULATORY_CARE_PROVIDER_SITE_OTHER): Payer: Self-pay | Admitting: *Deleted

## 2018-02-11 ENCOUNTER — Ambulatory Visit: Payer: Medicare Other

## 2018-02-11 DIAGNOSIS — I4821 Permanent atrial fibrillation: Secondary | ICD-10-CM

## 2018-02-11 DIAGNOSIS — Z950 Presence of cardiac pacemaker: Secondary | ICD-10-CM

## 2018-02-11 DIAGNOSIS — N281 Cyst of kidney, acquired: Secondary | ICD-10-CM | POA: Diagnosis not present

## 2018-02-11 DIAGNOSIS — I482 Chronic atrial fibrillation: Secondary | ICD-10-CM

## 2018-02-11 DIAGNOSIS — N183 Chronic kidney disease, stage 3 (moderate): Secondary | ICD-10-CM | POA: Diagnosis not present

## 2018-02-11 DIAGNOSIS — I5032 Chronic diastolic (congestive) heart failure: Secondary | ICD-10-CM

## 2018-02-11 DIAGNOSIS — I10 Essential (primary) hypertension: Secondary | ICD-10-CM | POA: Diagnosis not present

## 2018-02-11 NOTE — Patient Instructions (Signed)
Medication Instructions:   -- Continue to HOLD (do not take) warfarin until your wound recheck next week.  Labwork: N/A  Testing/Procedures: N/A  Follow-Up: Wound recheck on Thursday, 02/18/18 at 2:00pm  Any Other Special Instructions Will Be Listed Below (If Applicable). -- Do not shower or get pressure dressing wet.  If it falls off, please call the [facility] at [phone]  -- Contact Dr. Pennie Banter office to follow-up regarding your kidney function, and possibly to discuss leg wraps for your leg swelling per Dr. Caryl Comes.     If you need a refill on your cardiac medications before your next appointment, please call your pharmacy.

## 2018-02-11 NOTE — Progress Notes (Signed)
Wound recheck in clinic.  Device not checked.  Moderate hematoma remains over device site, softer to palpation.  Warfarin has been held since last check on 02/04/18.  Ecchymosis now purplish in color, which may indicate active bleeding per Dr. Caryl Comes.  No drainage noted.  Small scab remains over approximated incision, no drainage or fever/chills per patient.  Dr. Caryl Comes assessed site, recommended pressure dressing application, continue to hold warfarin, and wound recheck in 1 week.  Pressure dressing applied.  Patient and daughter verbalize understanding of instructions to continue holding warfarin and to return on 02/18/18 at 2:00pm for wound recheck.  Of note, patient continues to hold furosemide due to elevated creatinine as instructed by Dr. Caryl Comes on 02/05/18 (see phone note).  Patient's LLE no longer appears to be weeping, skin clean, dry, and intact.  BLE rash has improved greatly with completed course of Keflex.  Encouraged patient to contact Dr. Pennie Banter office for an appointment to discuss LE edema and to eventually schedule an INR check once warfarin is resumed.

## 2018-02-15 DIAGNOSIS — R609 Edema, unspecified: Secondary | ICD-10-CM | POA: Diagnosis not present

## 2018-02-15 DIAGNOSIS — I4891 Unspecified atrial fibrillation: Secondary | ICD-10-CM | POA: Diagnosis not present

## 2018-02-15 DIAGNOSIS — Z7901 Long term (current) use of anticoagulants: Secondary | ICD-10-CM | POA: Diagnosis not present

## 2018-02-15 DIAGNOSIS — Z950 Presence of cardiac pacemaker: Secondary | ICD-10-CM | POA: Diagnosis not present

## 2018-02-15 DIAGNOSIS — R7989 Other specified abnormal findings of blood chemistry: Secondary | ICD-10-CM | POA: Diagnosis not present

## 2018-02-15 DIAGNOSIS — L0390 Cellulitis, unspecified: Secondary | ICD-10-CM | POA: Diagnosis not present

## 2018-02-18 ENCOUNTER — Ambulatory Visit (INDEPENDENT_AMBULATORY_CARE_PROVIDER_SITE_OTHER): Payer: Self-pay | Admitting: *Deleted

## 2018-02-18 DIAGNOSIS — I495 Sick sinus syndrome: Secondary | ICD-10-CM

## 2018-02-18 NOTE — Progress Notes (Signed)
     Hematoma wound re-check, pt to return on 02/24/18 for SK to re-assess hematoma.

## 2018-02-24 ENCOUNTER — Ambulatory Visit (INDEPENDENT_AMBULATORY_CARE_PROVIDER_SITE_OTHER): Payer: Self-pay | Admitting: *Deleted

## 2018-02-24 DIAGNOSIS — I495 Sick sinus syndrome: Secondary | ICD-10-CM

## 2018-02-24 NOTE — Progress Notes (Signed)
Patient presents to the office for a hematoma recheck s/p ppm implant on 01/20/18. Dr.Klein assessed wound. Incision edges approximated. Wound without redness or edema. Hematoma remains, but now measures 3in x 2.5inches per Dr.Klein. Hematoma is also not as prominent as it was at patient's 02/18/18 appointment. Hematoma is soft to palpation. Patient s/w Dr.Klein about his recent falls d/t weakness and arthritis of his R hip. Dr.Klein suggested that patient follow up with his PCP about the weakness to see if PT would be indicated. Dr.Klein decided to continue to hold patient's warfarin, indefinitely, d/t recent falls. Patient will follow up with Dr.Klein on 04/19/18 @ 1415 as scheduled. Patient was instructed to call for any signs/sx's of infection, or if the hematoma becomes larger, tighter, or begins to drain. Patient and daughter verbalized understanding.

## 2018-02-25 ENCOUNTER — Ambulatory Visit: Payer: Medicare Other | Admitting: Hematology & Oncology

## 2018-02-25 ENCOUNTER — Other Ambulatory Visit: Payer: Medicare Other

## 2018-03-03 DIAGNOSIS — M1711 Unilateral primary osteoarthritis, right knee: Secondary | ICD-10-CM | POA: Diagnosis not present

## 2018-03-03 DIAGNOSIS — M1712 Unilateral primary osteoarthritis, left knee: Secondary | ICD-10-CM | POA: Diagnosis not present

## 2018-03-03 DIAGNOSIS — M25561 Pain in right knee: Secondary | ICD-10-CM | POA: Diagnosis not present

## 2018-03-03 DIAGNOSIS — M1990 Unspecified osteoarthritis, unspecified site: Secondary | ICD-10-CM | POA: Diagnosis not present

## 2018-03-03 DIAGNOSIS — M7061 Trochanteric bursitis, right hip: Secondary | ICD-10-CM | POA: Diagnosis not present

## 2018-03-04 DIAGNOSIS — M7061 Trochanteric bursitis, right hip: Secondary | ICD-10-CM | POA: Diagnosis not present

## 2018-03-04 DIAGNOSIS — M25561 Pain in right knee: Secondary | ICD-10-CM | POA: Diagnosis not present

## 2018-03-04 DIAGNOSIS — M1990 Unspecified osteoarthritis, unspecified site: Secondary | ICD-10-CM | POA: Diagnosis not present

## 2018-03-08 DIAGNOSIS — R0689 Other abnormalities of breathing: Secondary | ICD-10-CM | POA: Diagnosis not present

## 2018-03-08 DIAGNOSIS — R0902 Hypoxemia: Secondary | ICD-10-CM | POA: Diagnosis not present

## 2018-03-08 DIAGNOSIS — W19XXXA Unspecified fall, initial encounter: Secondary | ICD-10-CM | POA: Diagnosis not present

## 2018-03-08 DIAGNOSIS — I499 Cardiac arrhythmia, unspecified: Secondary | ICD-10-CM | POA: Diagnosis not present

## 2018-03-08 DIAGNOSIS — R402441 Other coma, without documented Glasgow coma scale score, or with partial score reported, in the field [EMT or ambulance]: Secondary | ICD-10-CM | POA: Diagnosis not present

## 2018-03-23 ENCOUNTER — Telehealth: Payer: Self-pay | Admitting: *Deleted

## 2018-03-23 ENCOUNTER — Telehealth: Payer: Self-pay

## 2018-03-23 NOTE — Telephone Encounter (Signed)
Spoke with patients wife who states that patient passed away. I advised that she could dispose of his home monitor and I would make note in our system. She verbalized understanding.

## 2018-03-23 NOTE — Telephone Encounter (Signed)
See phone note from Ria Clock, RN on 03/23/18.

## 2018-03-23 NOTE — Telephone Encounter (Signed)
Patients wife called stating her husband has passed away.  She wanted to know what to do with his, "remote", from our office.  Will forward to device clinic.

## 2018-03-25 NOTE — Telephone Encounter (Signed)
Spoke w/wife

## 2018-04-05 DEATH — deceased

## 2018-04-22 ENCOUNTER — Encounter: Payer: Medicare Other | Admitting: Internal Medicine
